# Patient Record
Sex: Female | Born: 1950 | Race: White | Hispanic: No | Marital: Married | Smoking: Never smoker
Health system: Southern US, Community
[De-identification: ages and names within clinical notes are randomized; demographics above are authoritative.]

## PROBLEM LIST (undated history)

## (undated) DIAGNOSIS — R7989 Other specified abnormal findings of blood chemistry: Secondary | ICD-10-CM

## (undated) DIAGNOSIS — M722 Plantar fascial fibromatosis: Secondary | ICD-10-CM

## (undated) DIAGNOSIS — M8580 Other specified disorders of bone density and structure, unspecified site: Secondary | ICD-10-CM

## (undated) DIAGNOSIS — E7211 Homocystinuria: Secondary | ICD-10-CM

## (undated) DIAGNOSIS — R0989 Other specified symptoms and signs involving the circulatory and respiratory systems: Secondary | ICD-10-CM

## (undated) HISTORY — PX: LAPAROSCOPIC ENDOMETRIOSIS FULGURATION: SUR769

## (undated) HISTORY — DX: Plantar fascial fibromatosis: M72.2

## (undated) HISTORY — DX: Other specified symptoms and signs involving the circulatory and respiratory systems: R09.89

## (undated) HISTORY — DX: Other specified disorders of bone density and structure, unspecified site: M85.80

## (undated) HISTORY — DX: Other specified abnormal findings of blood chemistry: R79.89

## (undated) HISTORY — PX: HEMORRHOID SURGERY: SHX153

## (undated) HISTORY — DX: Homocystinuria: E72.11

---

## 1998-05-17 ENCOUNTER — Emergency Department (HOSPITAL_COMMUNITY): Admission: EM | Admit: 1998-05-17 | Discharge: 1998-05-17 | Payer: Self-pay | Admitting: Emergency Medicine

## 1998-05-18 ENCOUNTER — Inpatient Hospital Stay (HOSPITAL_COMMUNITY): Admission: EM | Admit: 1998-05-18 | Discharge: 1998-05-20 | Payer: Self-pay | Admitting: Emergency Medicine

## 1998-09-18 ENCOUNTER — Other Ambulatory Visit: Admission: RE | Admit: 1998-09-18 | Discharge: 1998-09-18 | Payer: Self-pay | Admitting: Obstetrics and Gynecology

## 1999-09-30 HISTORY — PX: POLYPECTOMY: SHX149

## 1999-10-24 ENCOUNTER — Other Ambulatory Visit: Admission: RE | Admit: 1999-10-24 | Discharge: 1999-10-24 | Payer: Self-pay | Admitting: Obstetrics and Gynecology

## 2000-03-19 ENCOUNTER — Ambulatory Visit (HOSPITAL_COMMUNITY): Admission: RE | Admit: 2000-03-19 | Discharge: 2000-03-19 | Payer: Self-pay | Admitting: Gastroenterology

## 2000-11-19 ENCOUNTER — Other Ambulatory Visit: Admission: RE | Admit: 2000-11-19 | Discharge: 2000-11-19 | Payer: Self-pay | Admitting: Obstetrics and Gynecology

## 2001-10-06 ENCOUNTER — Encounter: Admission: RE | Admit: 2001-10-06 | Discharge: 2001-10-06 | Payer: Self-pay | Admitting: General Surgery

## 2001-10-06 ENCOUNTER — Encounter: Payer: Self-pay | Admitting: General Surgery

## 2001-10-07 ENCOUNTER — Ambulatory Visit (HOSPITAL_BASED_OUTPATIENT_CLINIC_OR_DEPARTMENT_OTHER): Admission: RE | Admit: 2001-10-07 | Discharge: 2001-10-08 | Payer: Self-pay | Admitting: General Surgery

## 2002-06-21 ENCOUNTER — Other Ambulatory Visit: Admission: RE | Admit: 2002-06-21 | Discharge: 2002-06-21 | Payer: Self-pay | Admitting: Obstetrics and Gynecology

## 2003-09-12 ENCOUNTER — Other Ambulatory Visit: Admission: RE | Admit: 2003-09-12 | Discharge: 2003-09-12 | Payer: Self-pay | Admitting: Obstetrics and Gynecology

## 2005-02-03 ENCOUNTER — Ambulatory Visit: Payer: Self-pay | Admitting: Internal Medicine

## 2005-10-27 ENCOUNTER — Ambulatory Visit: Payer: Self-pay | Admitting: Internal Medicine

## 2005-11-18 ENCOUNTER — Ambulatory Visit: Payer: Self-pay | Admitting: Internal Medicine

## 2006-07-10 ENCOUNTER — Ambulatory Visit: Payer: Self-pay | Admitting: Internal Medicine

## 2006-09-02 ENCOUNTER — Ambulatory Visit: Payer: Self-pay | Admitting: Internal Medicine

## 2006-11-25 ENCOUNTER — Ambulatory Visit: Payer: Self-pay | Admitting: Internal Medicine

## 2006-12-02 ENCOUNTER — Ambulatory Visit: Payer: Self-pay | Admitting: Internal Medicine

## 2006-12-02 LAB — CONVERTED CEMR LAB
ALT: 15 units/L (ref 0–40)
AST: 22 units/L (ref 0–37)
Albumin: 4 g/dL (ref 3.5–5.2)
Alkaline Phosphatase: 47 units/L (ref 39–117)
BUN: 12 mg/dL (ref 6–23)
Basophils Absolute: 0 10*3/uL (ref 0.0–0.1)
Basophils Relative: 0.3 % (ref 0.0–1.0)
Bilirubin, Direct: 0.1 mg/dL (ref 0.0–0.3)
CO2: 29 meq/L (ref 19–32)
Calcium: 9 mg/dL (ref 8.4–10.5)
Chloride: 108 meq/L (ref 96–112)
Creatinine, Ser: 0.4 mg/dL (ref 0.4–1.2)
Eosinophils Absolute: 0.2 10*3/uL (ref 0.0–0.6)
Eosinophils Relative: 4 % (ref 0.0–5.0)
GFR calc Af Amer: 213 mL/min
GFR calc non Af Amer: 176 mL/min
Glucose, Bld: 96 mg/dL (ref 70–99)
HCT: 41.8 % (ref 36.0–46.0)
Hemoglobin: 14.6 g/dL (ref 12.0–15.0)
Lymphocytes Relative: 35 % (ref 12.0–46.0)
MCHC: 35 g/dL (ref 30.0–36.0)
MCV: 91.7 fL (ref 78.0–100.0)
Monocytes Absolute: 0.4 10*3/uL (ref 0.2–0.7)
Monocytes Relative: 8.5 % (ref 3.0–11.0)
Neutro Abs: 2.5 10*3/uL (ref 1.4–7.7)
Neutrophils Relative %: 52.2 % (ref 43.0–77.0)
Platelets: 217 10*3/uL (ref 150–400)
Potassium: 4 meq/L (ref 3.5–5.1)
RBC: 4.56 M/uL (ref 3.87–5.11)
RDW: 11.7 % (ref 11.5–14.6)
Sodium: 142 meq/L (ref 135–145)
TSH: 2.64 microintl units/mL (ref 0.35–5.50)
Total Bilirubin: 1.2 mg/dL (ref 0.3–1.2)
Total Protein: 6.4 g/dL (ref 6.0–8.3)
WBC: 4.7 10*3/uL (ref 4.5–10.5)

## 2007-05-26 ENCOUNTER — Encounter: Payer: Self-pay | Admitting: Internal Medicine

## 2007-08-06 ENCOUNTER — Telehealth (INDEPENDENT_AMBULATORY_CARE_PROVIDER_SITE_OTHER): Payer: Self-pay | Admitting: *Deleted

## 2007-10-08 ENCOUNTER — Ambulatory Visit: Payer: Self-pay | Admitting: Internal Medicine

## 2007-10-08 DIAGNOSIS — M722 Plantar fascial fibromatosis: Secondary | ICD-10-CM

## 2007-10-11 ENCOUNTER — Ambulatory Visit: Payer: Self-pay | Admitting: Internal Medicine

## 2007-10-14 ENCOUNTER — Encounter (INDEPENDENT_AMBULATORY_CARE_PROVIDER_SITE_OTHER): Payer: Self-pay | Admitting: *Deleted

## 2007-10-25 ENCOUNTER — Encounter: Admission: RE | Admit: 2007-10-25 | Discharge: 2008-01-23 | Payer: Self-pay | Admitting: Internal Medicine

## 2007-10-29 ENCOUNTER — Encounter: Payer: Self-pay | Admitting: Internal Medicine

## 2008-01-18 ENCOUNTER — Encounter: Payer: Self-pay | Admitting: Internal Medicine

## 2008-02-11 ENCOUNTER — Encounter (INDEPENDENT_AMBULATORY_CARE_PROVIDER_SITE_OTHER): Payer: Self-pay | Admitting: *Deleted

## 2008-02-11 ENCOUNTER — Ambulatory Visit: Payer: Self-pay | Admitting: Internal Medicine

## 2008-02-11 DIAGNOSIS — N951 Menopausal and female climacteric states: Secondary | ICD-10-CM

## 2008-02-11 DIAGNOSIS — D126 Benign neoplasm of colon, unspecified: Secondary | ICD-10-CM

## 2008-02-15 ENCOUNTER — Encounter: Payer: Self-pay | Admitting: Internal Medicine

## 2008-02-17 ENCOUNTER — Encounter (INDEPENDENT_AMBULATORY_CARE_PROVIDER_SITE_OTHER): Payer: Self-pay | Admitting: *Deleted

## 2008-03-08 ENCOUNTER — Ambulatory Visit: Payer: Self-pay | Admitting: Gastroenterology

## 2008-07-17 ENCOUNTER — Telehealth: Payer: Self-pay | Admitting: Internal Medicine

## 2008-07-18 ENCOUNTER — Telehealth (INDEPENDENT_AMBULATORY_CARE_PROVIDER_SITE_OTHER): Payer: Self-pay | Admitting: *Deleted

## 2008-07-18 ENCOUNTER — Ambulatory Visit: Payer: Self-pay | Admitting: Internal Medicine

## 2008-07-19 ENCOUNTER — Telehealth (INDEPENDENT_AMBULATORY_CARE_PROVIDER_SITE_OTHER): Payer: Self-pay | Admitting: *Deleted

## 2008-11-06 ENCOUNTER — Ambulatory Visit: Payer: Self-pay | Admitting: Internal Medicine

## 2009-03-29 ENCOUNTER — Encounter (INDEPENDENT_AMBULATORY_CARE_PROVIDER_SITE_OTHER): Payer: Self-pay | Admitting: *Deleted

## 2009-05-01 ENCOUNTER — Ambulatory Visit: Payer: Self-pay | Admitting: Internal Medicine

## 2009-05-15 ENCOUNTER — Telehealth (INDEPENDENT_AMBULATORY_CARE_PROVIDER_SITE_OTHER): Payer: Self-pay | Admitting: *Deleted

## 2009-05-16 ENCOUNTER — Telehealth (INDEPENDENT_AMBULATORY_CARE_PROVIDER_SITE_OTHER): Payer: Self-pay | Admitting: *Deleted

## 2009-07-03 ENCOUNTER — Encounter: Payer: Self-pay | Admitting: Internal Medicine

## 2009-10-10 ENCOUNTER — Ambulatory Visit: Payer: Self-pay | Admitting: Internal Medicine

## 2009-12-20 ENCOUNTER — Ambulatory Visit: Payer: Self-pay | Admitting: Internal Medicine

## 2009-12-20 DIAGNOSIS — H9319 Tinnitus, unspecified ear: Secondary | ICD-10-CM | POA: Insufficient documentation

## 2009-12-20 DIAGNOSIS — H9190 Unspecified hearing loss, unspecified ear: Secondary | ICD-10-CM | POA: Insufficient documentation

## 2010-05-15 ENCOUNTER — Ambulatory Visit: Payer: Self-pay | Admitting: Internal Medicine

## 2010-05-15 ENCOUNTER — Encounter (INDEPENDENT_AMBULATORY_CARE_PROVIDER_SITE_OTHER): Payer: Self-pay | Admitting: *Deleted

## 2010-05-15 DIAGNOSIS — G479 Sleep disorder, unspecified: Secondary | ICD-10-CM

## 2010-05-15 DIAGNOSIS — R002 Palpitations: Secondary | ICD-10-CM | POA: Insufficient documentation

## 2010-06-18 ENCOUNTER — Ambulatory Visit: Payer: Self-pay | Admitting: Internal Medicine

## 2010-06-28 ENCOUNTER — Encounter (INDEPENDENT_AMBULATORY_CARE_PROVIDER_SITE_OTHER): Payer: Self-pay | Admitting: *Deleted

## 2010-07-01 ENCOUNTER — Encounter (INDEPENDENT_AMBULATORY_CARE_PROVIDER_SITE_OTHER): Payer: Self-pay | Admitting: *Deleted

## 2010-07-03 ENCOUNTER — Ambulatory Visit: Payer: Self-pay | Admitting: Gastroenterology

## 2010-10-27 LAB — CONVERTED CEMR LAB
ALT: 17 units/L (ref 0–35)
ALT: 27 units/L (ref 0–35)
AST: 21 units/L (ref 0–37)
AST: 25 units/L (ref 0–37)
Albumin: 4.2 g/dL (ref 3.5–5.2)
Albumin: 4.4 g/dL (ref 3.5–5.2)
Alkaline Phosphatase: 53 units/L (ref 39–117)
Alkaline Phosphatase: 54 units/L (ref 39–117)
BUN: 16 mg/dL (ref 6–23)
BUN: 17 mg/dL (ref 6–23)
Basophils Absolute: 0 10*3/uL (ref 0.0–0.1)
Basophils Absolute: 0 10*3/uL (ref 0.0–0.1)
Basophils Relative: 0.5 % (ref 0.0–3.0)
Basophils Relative: 0.7 % (ref 0.0–1.0)
Bilirubin, Direct: 0.1 mg/dL (ref 0.0–0.3)
Bilirubin, Direct: 0.2 mg/dL (ref 0.0–0.3)
CO2: 29 meq/L (ref 19–32)
CO2: 30 meq/L (ref 19–32)
Calcium: 9.2 mg/dL (ref 8.4–10.5)
Calcium: 9.4 mg/dL (ref 8.4–10.5)
Chloride: 105 meq/L (ref 96–112)
Chloride: 109 meq/L (ref 96–112)
Cholesterol: 170 mg/dL (ref 0–200)
Cholesterol: 193 mg/dL (ref 0–200)
Creatinine, Ser: 0.6 mg/dL (ref 0.4–1.2)
Creatinine, Ser: 0.7 mg/dL (ref 0.4–1.2)
Eosinophils Absolute: 0.1 10*3/uL (ref 0.0–0.7)
Eosinophils Absolute: 0.2 10*3/uL (ref 0.0–0.7)
Eosinophils Relative: 3.2 % (ref 0.0–5.0)
Eosinophils Relative: 3.3 % (ref 0.0–5.0)
Free T4: 0.79 ng/dL (ref 0.60–1.60)
GFR calc Af Amer: 111 mL/min
GFR calc non Af Amer: 104.67 mL/min (ref >60)
GFR calc non Af Amer: 92 mL/min
Glucose, Bld: 88 mg/dL (ref 70–99)
Glucose, Bld: 93 mg/dL (ref 70–99)
HCT: 44.1 % (ref 36.0–46.0)
HCT: 44.6 % (ref 36.0–46.0)
HDL: 54.6 mg/dL (ref >39.0)
HDL: 66.7 mg/dL (ref >39.00)
Hemoglobin: 14.7 g/dL (ref 12.0–15.0)
Hemoglobin: 15.2 g/dL — ABNORMAL HIGH (ref 12.0–15.0)
LDL Cholesterol: 107 mg/dL — ABNORMAL HIGH (ref 0–99)
LDL Cholesterol: 114 mg/dL — ABNORMAL HIGH (ref 0–99)
Lymphocytes Relative: 33.7 % (ref 12.0–46.0)
Lymphocytes Relative: 34.1 % (ref 12.0–46.0)
Lymphs Abs: 1.8 10*3/uL (ref 0.7–4.0)
MCHC: 33.4 g/dL (ref 30.0–36.0)
MCHC: 34.1 g/dL (ref 30.0–36.0)
MCV: 93.9 fL (ref 78.0–100.0)
MCV: 95.3 fL (ref 78.0–100.0)
Magnesium: 2 mg/dL (ref 1.5–2.5)
Monocytes Absolute: 0.3 10*3/uL (ref 0.1–1.0)
Monocytes Absolute: 0.4 10*3/uL (ref 0.1–1.0)
Monocytes Relative: 7.4 % (ref 3.0–12.0)
Monocytes Relative: 7.9 % (ref 3.0–12.0)
Neutro Abs: 2.2 10*3/uL (ref 1.4–7.7)
Neutro Abs: 2.9 10*3/uL (ref 1.4–7.7)
Neutrophils Relative %: 54.5 % (ref 43.0–77.0)
Neutrophils Relative %: 54.7 % (ref 43.0–77.0)
Platelets: 207 10*3/uL (ref 150.0–400.0)
Platelets: 219 10*3/uL (ref 150–400)
Potassium: 3.8 meq/L (ref 3.5–5.1)
Potassium: 5.3 meq/L — ABNORMAL HIGH (ref 3.5–5.1)
RBC: 4.68 M/uL (ref 3.87–5.11)
RBC: 4.7 M/uL (ref 3.87–5.11)
RDW: 11.6 % (ref 11.5–14.6)
RDW: 12.6 % (ref 11.5–14.6)
Sodium: 142 meq/L (ref 135–145)
Sodium: 143 meq/L (ref 135–145)
TSH: 1.55 microintl units/mL (ref 0.35–5.50)
TSH: 1.9 microintl units/mL (ref 0.35–5.50)
Total Bilirubin: 0.9 mg/dL (ref 0.3–1.2)
Total Bilirubin: 1.1 mg/dL (ref 0.3–1.2)
Total CHOL/HDL Ratio: 3
Total CHOL/HDL Ratio: 3.1
Total Protein: 6.6 g/dL (ref 6.0–8.3)
Total Protein: 6.7 g/dL (ref 6.0–8.3)
Triglycerides: 41 mg/dL (ref 0–149)
Triglycerides: 63 mg/dL (ref 0.0–149.0)
VLDL: 12.6 mg/dL (ref 0.0–40.0)
VLDL: 8 mg/dL (ref 0–40)
WBC: 4 10*3/uL — ABNORMAL LOW (ref 4.5–10.5)
WBC: 5.2 10*3/uL (ref 4.5–10.5)

## 2010-10-29 NOTE — Miscellaneous (Signed)
Summary: [facility] Previsit/prep  Clinical Lists Changes  Medications: Added new medication of MOVIPREP 100 GM  SOLR (PEG-KCL-NACL-NASULF-NA ASC-C) As per prep instructions. - Signed Rx of MOVIPREP 100 GM  SOLR (PEG-KCL-NACL-NASULF-NA ASC-C) As per prep instructions.;  #1 x 0;  Signed;  Entered by: Wyona Almas RN;  Authorized by: Mardella Layman MD [facility];  Method used: Electronically to [facility] # [id number]*, [address] [place], [place], [place]  [address], Ph: [phone] or [phone], Fax: [phone] Observations: Added new observation of ALLERGY REV: Done (07/03/2010 8:43)    Prescriptions: MOVIPREP 100 GM  SOLR (PEG-KCL-NACL-NASULF-NA ASC-C) As per prep instructions.  #1 x 0   Entered by:   Wyona Almas RN   Authorized by:   Mardella Layman MD [facility]   Signed by:   Wyona Almas RN on 07/03/2010   Method used:   Electronically to        [facility] [address] [id number]* (retail)       [address]       [place], [place]  [address]       Ph: [phone] or [phone]       Fax: [phone]   RxID:   [id number]

## 2010-10-29 NOTE — Assessment & Plan Note (Signed)
Summary: flu shot/cbs  Nurse Visit   Allergies: 1)  ! Pcn 2)  ! Cipro 3)  ! Erythromycin 4)  ! Minocin (Minocycline Hcl) 5)  ! Lamisil (Terbinafine Hcl) 6)  ! Ambien Cr (Zolpidem Tartrate)  Orders Added: 1)  Admin 1st Vaccine [90471] 2)  Flu Vaccine 56yrs + [[address]] Flu Vaccine Consent Questions     Do you have a history of severe allergic reactions to this vaccine? no    Any prior history of allergic reactions to egg and/or gelatin? no    Do you have a sensitivity to the preservative Thimersol? no    Do you have a past history of Guillan-Barre Syndrome? no    Do you currently have an acute febrile illness? no    Have you ever had a severe reaction to latex? no    Vaccine information given and explained to patient? yes    Are you currently pregnant? no    Lot Number:[id number]   Exp Date:03/29/2011   Site Given  Right Deltoid IMes-CCC]  .lbflu

## 2010-10-29 NOTE — Letter (Signed)
Summary: [facility] Instructions  [facility]  [address] [place], [place] [address]   Phone: [phone]  Fax: [phone]       Brittany Myers    16-Aug-1951    MRN: [id number]        Procedure Day /Date: Monday 07-15-10     Arrival Time: 10:30 a.m.      Procedure Time: 11:30 a.m.     Location of Procedure:                    _x _  [facility] (4th Floor)                        PREPARATION FOR COLONOSCOPY WITH MOVIPREP   Starting 5 days prior to your procedure  07-10-10 do not eat nuts, seeds, popcorn, corn, beans, peas,  salads, or any raw vegetables.  Do not take any fiber supplements (e.g. Metamucil, Citrucel, and Benefiber).  THE DAY BEFORE YOUR PROCEDURE         DATE:  07-14-10  DAY:  Sunday  1.  Drink clear liquids the entire day-NO SOLID FOOD  2.  Do not drink anything colored red or purple.  Avoid juices with pulp.  No orange juice.  3.  Drink at least 64 oz. (8 glasses) of fluid/clear liquids during the day to prevent dehydration and help the prep work efficiently.  CLEAR LIQUIDS INCLUDE: Water Jello Ice Popsicles Tea (sugar ok, no milk/cream) Powdered fruit flavored drinks Coffee (sugar ok, no milk/cream) Gatorade Juice: apple, white grape, white cranberry  Lemonade Clear bullion, consomm, broth Carbonated beverages (any kind) Strained chicken noodle soup Hard Candy                             4.  In the morning, mix first dose of MoviPrep solution:    Empty 1 Pouch A and 1 Pouch B into the disposable container    Add lukewarm drinking water to the top line of the container. Mix to dissolve    Refrigerate (mixed solution should be used within 24 hrs)  5.  Begin drinking the prep at 5:00 p.m. The MoviPrep container is divided by 4 marks.   Every 15 minutes drink the solution down to the next mark (approximately 8 oz) until the full liter is complete.   6.  Follow completed prep with 16 oz of clear liquid of your  choice (Nothing red or purple).  Continue to drink clear liquids until bedtime.  7.  Before going to bed, mix second dose of MoviPrep solution:    Empty 1 Pouch A and 1 Pouch B into the disposable container    Add lukewarm drinking water to the top line of the container. Mix to dissolve    Refrigerate  THE DAY OF YOUR PROCEDURE      DATE:  07-15-10  DAY: Monday  Beginning at  6:30 a.m. (5 hours before procedure):         1. Every 15 minutes, drink the solution down to the next mark (approx 8 oz) until the full liter is complete.  2. Follow completed prep with 16 oz. of clear liquid of your choice.    3. You may drink clear liquids until  9:30 a.m. (2 HOURS BEFORE PROCEDURE).   MEDICATION INSTRUCTIONS  Unless otherwise instructed, you should take regular prescription medications with a small sip of water  as early as possible the morning of your procedure.        OTHER INSTRUCTIONS  You will need a responsible adult at least 60 years of age to accompany you and drive you home.   This person must remain in the waiting room during your procedure.  Wear loose fitting clothing that is easily removed.  Leave jewelry and other valuables at home.  However, you may wish to bring a book to read or  an iPod/MP3 player to listen to music as you wait for your procedure to start.  Remove all body piercing jewelry and leave at home.  Total time from sign-in until discharge is approximately 2-3 hours.  You should go home directly after your procedure and rest.  You can resume normal activities the  day after your procedure.  The day of your procedure you should not:   Drive   Make legal decisions   Operate machinery   Drink alcohol   Return to work  You will receive specific instructions about eating, activities and medications before you leave.    The above instructions have been reviewed and explained to me by   Wyona Almas RN  July 03, 2010 9:18 AM     I  fully understand and can verbalize these instructions _____________________________ Date _________

## 2010-10-29 NOTE — Assessment & Plan Note (Signed)
Summary: cpx/cbs   Vital Signs:  Patient profile:   60 year old female Height:      64.5 inches (163.83 cm) Weight:      118.38 pounds (53.81 kg) BMI:     20.08 Pulse rate:   59 / minute Resp:     12 per minute BP sitting:   127 / 70  (left arm) Cuff size:   regular  Vitals Entered By: Lucious Groves CMA (May 15, 2010 9:55 AM)  Is Patient Diabetic? No Pain Assessment Patient in pain? no        History of Present Illness: Brittany Myers is here for a physical; she has been having palpitations @ rest, ? related to recent heat wave. The patient denies dizziness, presyncope, syncope, chest pain, shortness of breath, and throat tightness.  Associated symptoms include heat intolerance.  The patient denies the following symptoms: blurred vision, numbness, weakness, diaphoresis, nausea, and weight loss.  The palpitations are described as a sensation of a butterfly in the chest.  The palpitations are sudden in onset, intermittent, occur weekly, occur at rest, and occur at night.        The patient also presents with Insomnia.  The patient reports frequent awakening, early awakening, and daytime somnolence, but denies difficulty falling asleep, nightmares, leg movements, snoring, and apnea noted by partner.  Past treatments that have been effective include sedatives, but the Lorazepam causes "medicine head" the next day.    Preventive Screening-Counseling & Management  Alcohol-Tobacco     Smoking Status: never  Caffeine-Diet-Exercise     Does Patient Exercise: no  Current Medications (verified): 1)  Multivitamin 2)  Folic Acid 3)  Asa 81mg  4)  Magnesium 5)  Vit D 6)  Fluticonisole 7)  Calcium 1000mg  8)  Ativan 0.5 Mg Tabs (Lorazepam) .... 1/2-1 At Bedtime As Needed  Allergies (verified): 1)  ! Pcn 2)  ! Cipro 3)  ! Erythromycin 4)  ! Minocin (Minocycline Hcl) 5)  ! Lamisil (Terbinafine Hcl) 6)  ! 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) Osteopenia -1.5 T score  Past Surgical History: 5 surgeries for endometriosis, Dr Rosalio Macadamia Lamisil , Penicillin  reaction( hives, angioedema), Hemorrhoidectomy G4 P0 Colon polypectomy2001,Dr Victorino Dike  Family History: Father: CHF Mother: Endometriosis, elevated lipids , Osteopenia Siblings: BJY:NWGNFAOZ  lipids; MGF :MI in 51s; M aunt : liver cancer; PGM: colon cancer; MGM : Alsheimer's  Social History: heart healthy diet Occupation:General Surveyor, mining Married Never Smoked Alcohol use-yes: minimally Regular exercise-no  Review of Systems  The patient denies anorexia, fever, weight loss, weight gain, vision loss, hoarseness, prolonged cough, hemoptysis, abdominal pain, melena, hematochezia, severe indigestion/heartburn, hematuria, incontinence, suspicious skin lesions, depression, unusual weight change, abnormal bleeding, enlarged lymph nodes, and angioedema.         Some tinnitus with minimal hearing loss R ear. Some cramping & gas infraumbilical area every other day ; raw foods are a trigger.  Physical Exam  General:  Thin ,well-nourished; alert,appropriate and cooperative throughout examination Head:  Normocephalic and atraumatic without obvious abnormalities.  Eyes:  No corneal or conjunctival inflammation noted. No lid lag.Perrla. Funduscopic exam benign, without hemorrhages, exudates or papilledema.  Ears:  External ear exam shows no significant lesions or deformities.  Otoscopic examination reveals clear canals, tympanic membranes are intact bilaterally without bulging, retraction, inflammation or discharge. Hearing is grossly normal bilaterally. Nose:  External nasal  examination shows no deformity or inflammation. Nasal mucosa are pink and moist without lesions or exudates. Mouth:  Oral mucosa and oropharynx without lesions or exudates.  Teeth  in good repair. Neck:  No deformities, masses, or tenderness noted. Lungs:  Normal respiratory effort, chest expands symmetrically. Lungs are clear to auscultation, no crackles or wheezes. Heart:  Normal rate and regular rhythm. S1 and S2 normal without gallop, murmur, click, rub or other extra sounds. Abdomen:  Bowel sounds positive,abdomen soft and non-tender without masses, organomegaly or hernias noted. Bruit not heard Genitalia:  Dr Rosalio Macadamia Msk:  No deformity or scoliosis noted of thoracic or lumbar spine.   Pulses:  R and L carotid,radial,dorsalis pedis and posterior tibial pulses are full and equal bilaterally Extremities:  No clubbing, cyanosis, edema, or deformity noted with normal full range of motion of all joints.  Absent L great toe nail Neurologic:  alert & oriented X3 and DTRs symmetrical and normal.   Skin:  Intact without suspicious lesions or rashes Cervical Nodes:  No lymphadenopathy noted Axillary Nodes:  No palpable lymphadenopathy Psych:  memory intact for recent and remote, normally interactive, good eye contact, and not anxious appearing.     Impression & Recommendations:  Problem # 1:  ROUTINE GENERAL MEDICAL EXAM@HEALTH  CARE FACL (ICD-V70.0)  Orders: EKG w/ Interpretation (93000) Venipuncture (29562) TLB-Lipid Panel (80061-LIPID) TLB-BMP (Basic Metabolic Panel-BMET) (80048-METABOL) TLB-CBC Platelet - w/Differential (85025-CBCD) TLB-Hepatic/Liver Function Pnl (80076-HEPATIC) TLB-TSH (Thyroid Stimulating Hormone) (84443-TSH) TLB-Magnesium (Mg) (83735-MG) TLB-T4 (Thyrox), Free [phone]) Specimen Handling ([address])  Problem # 2:  PALPITATIONS (ICD-785.1)  Orders: TLB-Magnesium (Mg) (83735-MG) TLB-T4 (Thyrox), Free (52841-LK4M) Specimen Handling (01027)  Problem # 3:  SLEEP DISORDER, CHRONIC (ICD-780.50)  Problem # 4:  COLONIC POLYPS, BENIGN (ICD-211.3)  Orders: Gastroenterology Referral (GI)  Complete Medication List: 1)  Multivitamin  2)   Folic Acid  3)  Asa 81mg   4)  Magnesium  5)  Vit D  6)  Fluticonisole  7)  Calcium 1000mg   8)  Ativan 0.5 Mg Tabs (Lorazepam) .... 1/2-1 at bedtime as needed 9)  Silenor 6 Mg Tabs (Doxepin hcl) .Marland Kitchen.. 1 at bedtime as needed  Patient Instructions: 1)  Trial of Align once daily as needed  for cramps or gas. Report response to samples of Silenor 6 mg at bedtime as needed  Prescriptions: SILENOR 6 MG TABS (DOXEPIN HCL) 1 at bedtime as needed  #30 x 1   Entered and Authorized by:   Marga Melnick MD   Signed by:   Marga Melnick MD on 05/15/2010   Method used:   Print then Give to Patient   RxID:   [id number]

## 2010-10-29 NOTE — Assessment & Plan Note (Signed)
Summary: righ ear pain//ph   Vital Signs:  Patient profile:   60 year old female Weight:      119.8 pounds Temp:     98.8 degrees F oral Pulse rate:   64 / minute Resp:     15 per minute BP sitting:   94 / 60  (left arm) Cuff size:   regular  Vitals Entered By: Shonna Chock (December 20, 2009 8:15 AM) CC: 1.) Right ear pain/ringing x 3 weeks  2.)Examine right side of back Comments REVIEWED MED LIST, PATIENT AGREED DOSE AND INSTRUCTION CORRECT  Holding all meds x 3 weeks since symptpms started. Hearing Screening:  Left Ear: 500-45               1000-45               2000-55               3000-30               4000-25 Right Ear: 500-35                1000-25                 2000-30                3000-25                4000-10  Hearing Screen   CC:  1.) Right ear pain/ringing x 3 weeks  2.)Examine right side of back.  History of Present Illness: Awoke 3 weeks ago with ringing in R ear , "like white noise from old tv sets" with hearing loss noted later.No pain, headache, signs of URI or imbalance. PMH of cerumen impaction 2-3 years ago.Rx: Debrox w/o benefit. Rash noted 03/19  R upper back; she questions shingles.She flew back from [facility] 4 weeks; no ear pain in flight. No excess noise exposure or excess ASA intake.  Allergies: 1)  ! Pcn 2)  ! Cipro 3)  ! Erythromycin 4)  ! Minocin (Minocycline Hcl) 5)  ! Lamisil (Terbinafine Hcl) 6)  ! Ambien Cr (Zolpidem Tartrate)  Review of Systems General:  Denies chills, fever, and sweats. ENT:  Denies nasal congestion, postnasal drainage, sinus pressure, and sore throat; No purulence. Derm:  Complains of rash; denies itching. Neuro:  Denies difficulty with concentration, disturbances in coordination, numbness, poor balance, sensation of room spinning, and tingling.  Physical Exam  General:  well-nourished,in no acute distress; alert,appropriate and cooperative throughout examination Eyes:  No corneal or conjunctival  inflammation noted. EOMI. Perrla. Field of Vision grossly normal. Ears:  External ear exam shows no significant lesions or deformities.  Otoscopic examination reveals clear canals, tympanic membranes are intact bilaterally without bulging, retraction, inflammation or discharge. Hearing is grossly normal ; whisper heard @  6 feet.Tuning fork exam WNL Nose:  External nasal examination shows no deformity or inflammation. Nasal mucosa are pink and moist without lesions or exudates. Mouth:  Oral mucosa and oropharynx without lesions or exudates.  Teeth in good repair. Pulses:  R and L carotid  pulses are full and equal bilaterally w/o bruits Neurologic:  alert & oriented X3, cranial nerves II-XII intact, strength normal in all extremities, gait normal, DTRs symmetrical and normal, finger-to-nose normal, and Romberg negative.   Skin:  1X1 cm dry rash  Cervical Nodes:  No lymphadenopathy noted Axillary Nodes:  No palpable lymphadenopathy   Impression & Recommendations:  Problem # 1:  TINNITUS, RIGHT (ICD-388.30)  Orders: Audiology (Audio)  Problem # 2:  HEARING LOSS (ICD-389.9)  Orders: Audiology (Audio)  Problem # 3:  RASH-NONVESICULAR (ICD-782.1) fungal etiology suggested  Complete Medication List: 1)  Multivitamin  2)  Folic Acid  3)  Asa 81mg   4)  Magnesium  5)  Vit D  6)  Fluticonisole  7)  Calcium 1000mg   8)  Ativan 0.5 Mg Tabs (Lorazepam) .... 1/2-1 at bedtime as needed  Patient Instructions: 1)  Nizoral cream two times a day to rash

## 2010-10-29 NOTE — Letter (Signed)
Summary: Cancer Screening/[facility] Personalized Risk Profile  Cancer Screening/Me [facility] Personalized Risk Profile   Imported By: Lanelle Bal 05/23/2010 12:44:29  _____________________________________________________________________  External Attachment:    Type:   Image     Comment:   External Document

## 2010-10-29 NOTE — Assessment & Plan Note (Signed)
Summary: flu shot/kdc  Nurse Visit   Allergies: 1)  ! Pcn 2)  ! Cipro 3)  ! Erythromycin 4)  ! Minocin (Minocycline Hcl) 5)  ! Lamisil (Terbinafine Hcl) 6)  ! Ambien Cr (Zolpidem Tartrate)  Orders Added: 1)  Admin 1st Vaccine [90471] 2)  Flu Vaccine 70yrs + [04540] Flu Vaccine Consent Questions     Do you have a history of severe allergic reactions to this vaccine? no    Any prior history of allergic reactions to egg and/or gelatin? no    Do you have a sensitivity to the preservative Thimersol? no    Do you have a past history of Guillan-Barre Syndrome? no    Do you currently have an acute febrile illness? no    Have you ever had a severe reaction to latex? no    Vaccine information given and explained to patient? yes    Are you currently pregnant? no    Lot Number:[id number]   Exp Date:03/28/2010   Site Given  Left Deltoid IM .lbflu

## 2010-10-29 NOTE — Letter (Signed)
Summary: Pre Visit Letter Revised  [facility]  [address] [place], [place] [address]   Phone: [phone]  Fax: [phone]    05/15/2010 MRN: [id number]  Brittany Myers [address] [place], [place]  [address]              Procedure Date:  07-12-10    Welcome to the Gastroenterology Division at [facility].    You are scheduled to see a nurse for your pre-procedure visit on 06-28-10 at 11:00a.m. on the 3rd floor at [facility], [address]  We ask that you try to arrive at our office 15 minutes prior to your appointment time to allow for check-in.  Please take a minute to review the attached form.  If you answer "Yes" to one or more of the questions on the first page, we ask that you call the person listed at your earliest opportunity.  If you answer "No" to all of the questions, please complete the rest of the form and bring it to your appointment.    Your nurse visit will consist of discussing your medical and surgical history, your immediate family medical history, and your medications.    If you are unable to list all of your medications on the form, please bring the medication bottles to your appointment and we will list them.  We will need to be aware of both prescribed and over the counter drugs.  We will need to know exact dosage information as well.    Please be prepared to read and sign documents such as consent forms, a financial agreement, and acknowledgement forms.  If necessary, and with your consent, a friend or relative is welcome to sit-in on the nurse visit with you.  Please bring your insurance card so that we may make a copy of it.  If your insurance requires a referral to see a specialist, please bring your referral form from your primary care physician.  No co-pay is required for this nurse visit.     If you cannot keep your appointment, please call [phone] to cancel or reschedule prior to your appointment date.  This  allows [place] the opportunity to schedule an appointment for another patient in need of care.   Thank you for choosing [facility] for your medical needs.  We appreciate the opportunity to care for you.  Please visit [place] at our website  to learn more about our practice.                     Sincerely,  The Gastroenterology Division

## 2010-10-29 NOTE — Letter (Signed)
Summary: Pre Visit No Show Letter  [facility]  [address] [place], [place] [address]   Phone: [phone]  Fax: [phone]        June 28, 2010 MRN: [id number]    Brittany Myers [address] [place], [place]  [address]    Dear Brittany Myers,   We have been unable to reach you by phone concerning the pre-procedure visit that you missed on 06/28/10. For this reason,your procedure scheduled on 07/15/10 has been cancelled. Our scheduling staff will gladly assist you with rescheduling your appointments at a more convenient time. Please call our office at [phone] between the hours of 8:00am and 5:00pm, press option #2 to reach an appointment scheduler. Please consider updating your contact numbers at this time so that we can reach you by phone in the future with schedule changes or results.    Thank you,    Wyona Almas RN [facility]

## 2010-12-06 ENCOUNTER — Other Ambulatory Visit: Payer: Self-pay | Admitting: Dermatology

## 2011-01-14 ENCOUNTER — Encounter: Payer: Self-pay | Admitting: Internal Medicine

## 2011-01-14 ENCOUNTER — Ambulatory Visit (INDEPENDENT_AMBULATORY_CARE_PROVIDER_SITE_OTHER): Payer: BC Managed Care – PPO | Admitting: Internal Medicine

## 2011-01-14 VITALS — BP 118/70 | HR 60 | Temp 98.3°F | Wt 121.6 lb

## 2011-01-14 DIAGNOSIS — J40 Bronchitis, not specified as acute or chronic: Secondary | ICD-10-CM

## 2011-01-14 MED ORDER — HYDROCODONE-HOMATROPINE 5-1.5 MG/5ML PO SYRP: 5.0000 mL | ORAL_SOLUTION | Freq: Four times a day (QID) | ORAL | Status: AC | PRN

## 2011-01-14 MED ORDER — AZITHROMYCIN 250 MG PO TABS: 250.0000 mg | ORAL_TABLET | Freq: Every day | ORAL | Status: AC

## 2011-01-14 NOTE — Patient Instructions (Signed)
Please continue to force fluids. If  symptoms don't resolve a chest x-ray would be indicated.

## 2011-01-14 NOTE — Progress Notes (Signed)
  Subjective:    Patient ID: Brittany Myers, female    DOB: [birth year], 60 y.o.   MRN: [id number]  HPI symptoms began last week as a scratchy throat and laryngitis. Several days later she began to have a cough which has been productive of yellow sputum.  She specifically denies all signs of rhinosinusitis. Specifically she has no frontal headache, facial pain, purulent nasal secretions, dental pain, or fever.  She has taken nonsteroidals for these symptoms as well as forcing oral fluids.  She's had some headache after the cough.  She has never smoked; there is no past medical history of asthma or reactive airways disease.   Review of Systems     Objective:   Physical Exam she is in no acute distress. Pupils are equal round reactive to light; there is no conjunctival inflammation. She has  She has no lymphadenopathy about the neck or axilla. The nares are patent without exudate. She is slightly hoarse; there is no erythema in the pharynx. The otic canals are clear as are the tympanic membranes.  She exhibits no increased work of breathing. She does have suggestion of asymmetric rales on the right both superiorly and inferiorly. I  She has a slow S4 without significant murmur or gallop.  Skin is warm and dry well tanned.        Assessment & Plan:   #1 bronchitis without associated upper respiratory tract infection symptoms  Plan: #1 Z-Pak  #2 Hydromet  Chest x-ray will be performed if she does not have rapid resolution of her symptoms with this regimen.

## 2011-01-27 ENCOUNTER — Telehealth: Payer: Self-pay | Admitting: Internal Medicine

## 2011-01-27 DIAGNOSIS — R05 Cough: Secondary | ICD-10-CM

## 2011-01-27 NOTE — Telephone Encounter (Signed)
Left msg on voicemail for return call. 

## 2011-01-27 NOTE — Telephone Encounter (Signed)
Patient was seen [id number] she finished z-pac - she is still coughing - she is unable to take cough medicine at night keeps her awake - she wants to know if she should try taking it during the day - she also mentioned dr hopper may want to do a chest x-ray

## 2011-01-28 ENCOUNTER — Ambulatory Visit (INDEPENDENT_AMBULATORY_CARE_PROVIDER_SITE_OTHER)
Admission: RE | Admit: 2011-01-28 | Discharge: 2011-01-28 | Disposition: A | Discharge status: Home / Self Care | Payer: BC Managed Care – PPO | Source: Ambulatory Visit | Attending: Internal Medicine | Admitting: Internal Medicine

## 2011-01-28 DIAGNOSIS — R05 Cough: Secondary | ICD-10-CM

## 2011-01-28 NOTE — Telephone Encounter (Signed)
Spoke w/ pt says she is still w/ cough and according to office note chest xray will be needed if no better. Says she can take cough med today w/ some relief since she is unable to take it a night because it keeps her up. Will place order for chest xray pt will get today.

## 2011-03-07 ENCOUNTER — Encounter: Payer: Self-pay | Admitting: Internal Medicine

## 2011-03-07 ENCOUNTER — Ambulatory Visit (INDEPENDENT_AMBULATORY_CARE_PROVIDER_SITE_OTHER): Payer: BC Managed Care – PPO | Admitting: Internal Medicine

## 2011-03-07 VITALS — BP 104/62 | HR 79 | Wt 123.4 lb

## 2011-03-07 DIAGNOSIS — R002 Palpitations: Secondary | ICD-10-CM

## 2011-03-07 DIAGNOSIS — I499 Cardiac arrhythmia, unspecified: Secondary | ICD-10-CM

## 2011-03-07 DIAGNOSIS — M5412 Radiculopathy, cervical region: Secondary | ICD-10-CM

## 2011-03-07 LAB — CBC WITH DIFFERENTIAL/PLATELET
Basophils Absolute: 0 10*3/uL (ref 0.0–0.1)
Eosinophils Absolute: 0.2 10*3/uL (ref 0.0–0.7)
Lymphocytes Relative: 34.7 % (ref 12.0–46.0)
MCHC: 34.7 g/dL (ref 30.0–36.0)
MCV: 94.8 fl (ref 78.0–100.0)
Monocytes Absolute: 0.5 10*3/uL (ref 0.1–1.0)
Neutrophils Relative %: 53.1 % (ref 43.0–77.0)
Platelets: 209 10*3/uL (ref 150.0–400.0)
RDW: 11.8 % (ref 11.5–14.6)

## 2011-03-07 LAB — CALCIUM: Calcium: 9.5 mg/dL (ref 8.4–10.5)

## 2011-03-07 LAB — MAGNESIUM: Magnesium: 2.4 mg/dL (ref 1.5–2.5)

## 2011-03-07 LAB — POTASSIUM: Potassium: 4.4 mEq/L (ref 3.5–5.1)

## 2011-03-07 LAB — T4, FREE: Free T4: 0.67 ng/dL (ref 0.60–1.60)

## 2011-03-07 LAB — TSH: TSH: 1.78 u[IU]/mL (ref 0.35–5.50)

## 2011-03-07 MED ORDER — GABAPENTIN 100 MG PO CAPS: 100.0000 mg | ORAL_CAPSULE | Freq: Three times a day (TID) | ORAL | Status: DC

## 2011-03-07 NOTE — Patient Instructions (Signed)
Avoid stimulants; get plenty of rest

## 2011-03-07 NOTE — Progress Notes (Signed)
  Subjective:    Patient ID: Brittany Myers, female    DOB: [birth year], 60 y.o.   MRN: [id number]  HPI #1 Irregular pulse @ BCP monitor Onset: 06/06 as  "flopping " or"effervescence" Context: @ rest Duration: up to seconds Frequency: intermittently all day Trigger: caffeine Constitutional:no Fever, chills, sweats, weight change,  sleep issues. Some fatigue since bonchitis Cardiovascular: no Chest pain, syncope, diaphoresis, claudication GI:no Change in bowels, anorexia Derm:no Skin, hair, or nail changes Neurologic:no Tremor, numbness and tingling Psych: no Anxiety, depression, panic attacks Endocrine:no Hoarseness, temperature intolerance Possible triggers:no New medications, stimulants(decongestants, diet pills, nicotine)  #2Extremity pain RUE  Onset:2-3 weeks ago Trigger/injury:fall 2 years Pain quality:toothache Pain severity:up to 6 Duration:hrs Radiation:R thumb to R neck Exacerbating factors:no Review of systems: Musculoskeletal: muscle cramp or pain:no;  joint stiffness, redness, or swelling:no Skin:rash, color change:no Neuro:weakness:R hand since fall in 2010; incontinence (stool/urine):no; numbness and tingling:some tingling in R palm Heme:lymphadenopathy: no; abnormal bleeding or bruising:no Treatment/response:Advil 1 bid with some benefit     Review of Systems see above     Objective:   Physical Exam Gen.: Healthy and well-nourished in appearance. Alert, appropriate and cooperative throughout exam. Head: Normocephalic without obvious abnormalities Eyes: No corneal or conjunctival inflammation noted.Extraocular motion intact.  No lid lag Neck: No deformities, masses, or tenderness noted. Range of motion  Intact but pain with flexion or R lateral rotation. Thyroid normal. Lungs: Normal respiratory effort; chest expands symmetrically. Lungs are clear to auscultation without rales, wheezes, or increased work of breathing. Heart: Normal rate and rhythm. Normal S1 and  S2. No gallop, click, or rub. No murmur; occasional premature. Musculoskeletal/extremities: No deformity or scoliosis noted of  the thoracic or lumbar spine. No clubbing, cyanosis, edema, or deformity noted. Tone & strength  normal.Joints normal. Nail health  good. Vascular: Carotid, radial artery, dorsalis pedis and dorsalis posterior tibial pulses are full and equal. No bruits present. Neurologic: Alert and oriented x3. Deep tendon reflexes symmetrical and normal.    Gait & romberg normal       Skin: Intact without suspicious lesions or rashes;tanned. Lymph: No cervical, axillary, or inguinal lymphadenopathy present. Psych: Mood and affect are normal. Normally interactive                                                                                         Assessment & Plan:  #1 Unifocal PVCs #2 R Cervical radiculopathy Plan : see Orders

## 2011-03-07 NOTE — Progress Notes (Signed)
  Subjective:    Patient ID: Brittany Myers, female    DOB: Sep 21, 1951, 60 y.o.   MRN: [id number]  HPI    Review of Systems     Objective:   Physical Exam        Assessment & Plan:  EKG : UNIFOCAL PVCs. NO  AF, no ischemia

## 2011-03-12 ENCOUNTER — Ambulatory Visit (INDEPENDENT_AMBULATORY_CARE_PROVIDER_SITE_OTHER)
Admission: RE | Admit: 2011-03-12 | Discharge: 2011-03-12 | Disposition: A | Discharge status: Home / Self Care | Payer: BC Managed Care – PPO | Source: Ambulatory Visit | Attending: Internal Medicine | Admitting: Internal Medicine

## 2011-03-12 DIAGNOSIS — M5412 Radiculopathy, cervical region: Secondary | ICD-10-CM

## 2011-03-13 ENCOUNTER — Telehealth: Payer: Self-pay | Admitting: Internal Medicine

## 2011-03-13 NOTE — Telephone Encounter (Signed)
Monitor symptoms; earlier if persistant or progressive

## 2011-03-13 NOTE — Telephone Encounter (Signed)
Spoke w/ says she is going to be seeing Dr. Eden Emms on July 11 but still would like to see Dr. Myrtis Ser informed she should still be able to see him just make someone aware if she has to schedule f/u since it has already been approved.

## 2011-03-13 NOTE — Telephone Encounter (Signed)
Left msg for pt to return call.

## 2011-03-13 NOTE — Telephone Encounter (Signed)
In reference to Cardiology referral, patient originally requested Myrtis Ser because her husband sees him.  Myrtis Ser was no longer accepting new patients but made exception for this patient, however his first available appt is August 10.  Patient asking if Dr. Alwyn Ren thinks it is ok to wait this long to see Cardiology, or should she reschedule to be seen sooner by another Physician.  Please advise.

## 2011-03-17 ENCOUNTER — Encounter: Payer: Self-pay | Admitting: Cardiology

## 2011-04-07 ENCOUNTER — Institutional Professional Consult (permissible substitution): Payer: BC Managed Care – PPO | Admitting: Internal Medicine

## 2011-04-09 ENCOUNTER — Encounter: Payer: Self-pay | Admitting: Cardiovascular Disease

## 2011-04-09 ENCOUNTER — Ambulatory Visit (INDEPENDENT_AMBULATORY_CARE_PROVIDER_SITE_OTHER): Payer: BC Managed Care – PPO | Admitting: Cardiovascular Disease

## 2011-04-09 ENCOUNTER — Encounter (INDEPENDENT_AMBULATORY_CARE_PROVIDER_SITE_OTHER): Payer: BC Managed Care – PPO

## 2011-04-09 DIAGNOSIS — R002 Palpitations: Secondary | ICD-10-CM

## 2011-04-09 DIAGNOSIS — I493 Ventricular premature depolarization: Secondary | ICD-10-CM

## 2011-04-09 DIAGNOSIS — R079 Chest pain, unspecified: Secondary | ICD-10-CM | POA: Insufficient documentation

## 2011-04-09 DIAGNOSIS — I4949 Other premature depolarization: Secondary | ICD-10-CM

## 2011-04-09 NOTE — Patient Instructions (Signed)
Your physician has requested that you have a stress echocardiogram. For further information please visit [email or web address] Please follow instruction sheet as given.  Your physician has recommended that you wear a 21 day event monitor. Event monitors are medical devices that record the heart's electrical activity. Doctors most often [place] these monitors to diagnose arrhythmias. Arrhythmias are problems with the speed or rhythm of the heartbeat. The monitor is a small, portable device. You can wear one while you do your normal daily activities. This is usually used to diagnose what is causing palpitations/syncope (passing out).  Your physician recommends that you schedule a follow-up appointment in: 4-5 weeks.

## 2011-04-09 NOTE — Assessment & Plan Note (Signed)
Atypical. In setting of PVC;s needs stress echo

## 2011-04-09 NOTE — Progress Notes (Signed)
60 yo referred by Dr Alwyn Ren for unifocal PVC;s and palpitations.  Chronic over the years but much worse since June.  Initially noted during menapause.  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  No long runs of SVT type symptoms just premature palpitations.  No associated presyncope.  Occasional SSCP related to putting her Bra on with occasional radiation to jaw.  Had ETT about 9 years ago when her father died of an MI/CHF which was normal.  No excess stimulants or cafeinne.  No new suplements.    Reviewed Dr Caryl Never notes Reviewed lab work from 03/09/11 normal including thyroid  ROS: Denies fever, malais, weight loss, blurry vision, decreased visual acuity, cough, sputum, SOB, hemoptysis, pleuritic pain, palpitaitons, heartburn, abdominal pain, melena, lower extremity edema, claudication, or rash.  All other systems reviewed and negative   General: Affect appropriate Healthy:  appears stated age HEENT: normal Neck supple with no adenopathy JVP normal no bruits no thyromegaly Lungs clear with no wheezing and good diaphragmatic motion Heart:  S1/S2 no murmur,rub, gallop or click PMI normal Abdomen: benighn, BS positve, no tenderness, no AAA no bruit.  No HSM or HJR Distal pulses intact with no bruits No edema Neuro non-focal Skin warm and dry No muscular weakness  Medications Current Outpatient Prescriptions  Medication Sig Dispense Refill  . aspirin 81 MG tablet Take 81 mg by mouth daily.        . Calcium Carbonate-Vit D-Min (CALCIUM 1200 PO) Take 2 tablets by mouth daily.       Marland Kitchen estradiol (ESTRACE VAGINAL) 0.1 MG/GM vaginal cream Place 2 g vaginally daily.        . folic acid (FOLVITE) 400 MCG tablet Take 400 mcg by mouth daily.        . multivitamin (THERAGRAN) per tablet Take 1 tablet by mouth daily.        Marland Kitchen VITAMIN D, CHOLECALCIFEROL, PO Take 1 tablet by mouth daily.         Allergies Ciprofloxacin; Erythromycin; Minocycline hcl; Penicillins; and  Zolpidem tartrate  Family History: Family History  Problem Relation Age of Onset  . Endometriosis Mother   . Hyperlipidemia Mother   . Osteopenia Mother   . Hyperlipidemia Brother   . Heart attack Maternal Grandfather   . Liver cancer Maternal Aunt   . Colon cancer Paternal Grandmother   . Alzheimer's disease Maternal Grandmother   . Heart failure Father     Social History: History   Social History  . Marital Status: Married    Spouse Name: N/A    Number of Children: N/A  . Years of Education: N/A   Occupational History  . General Contractor/Interior Designer    Social History Main Topics  . Smoking status: Never Smoker   . Smokeless tobacco: Not on file  . Alcohol Use: Yes     minimally  . Drug Use: No  . Sexually Active: Not on file   Other Topics Concern  . Not on file   Social History Narrative   Heart healthy dietRegular Exercise-no    Electrocardiogram:  NSR PVC otherwise normal QT 387  Assessment and Plan

## 2011-04-09 NOTE — Assessment & Plan Note (Signed)
Likely benign PVC;s related to hormonal changes and stress.  21 day event moniter and stress echo. Encouraging that they are worse at rest and not during exercise.

## 2011-04-10 ENCOUNTER — Encounter: Payer: Self-pay | Admitting: *Deleted

## 2011-04-16 ENCOUNTER — Ambulatory Visit (HOSPITAL_COMMUNITY): Payer: BC Managed Care – PPO | Attending: Cardiovascular Disease | Admitting: Radiology

## 2011-04-16 ENCOUNTER — Other Ambulatory Visit (HOSPITAL_COMMUNITY): Payer: BC Managed Care – PPO | Admitting: Radiology

## 2011-04-17 ENCOUNTER — Telehealth: Payer: Self-pay | Admitting: Cardiovascular Disease

## 2011-04-17 NOTE — Telephone Encounter (Signed)
Per pt call, pt is trying to figure out cost and pt would like to know the code on monitor pt is wearing. Please return pt call to advise and discuss.

## 2011-04-17 NOTE — Telephone Encounter (Signed)
Please refer this pt to Windell Moulding.  She has different codes depending on what monitor pt will get.   Thanks,   Charmaine

## 2011-05-02 ENCOUNTER — Institutional Professional Consult (permissible substitution): Payer: BC Managed Care – PPO | Admitting: Cardiology

## 2011-05-09 ENCOUNTER — Institutional Professional Consult (permissible substitution): Payer: BC Managed Care – PPO | Admitting: Cardiology

## 2011-05-16 ENCOUNTER — Ambulatory Visit (INDEPENDENT_AMBULATORY_CARE_PROVIDER_SITE_OTHER): Payer: BC Managed Care – PPO | Admitting: Cardiovascular Disease

## 2011-05-16 ENCOUNTER — Encounter: Payer: Self-pay | Admitting: Cardiovascular Disease

## 2011-05-16 DIAGNOSIS — R079 Chest pain, unspecified: Secondary | ICD-10-CM

## 2011-05-16 DIAGNOSIS — R002 Palpitations: Secondary | ICD-10-CM

## 2011-05-16 MED ORDER — ACEBUTOLOL HCL 200 MG PO CAPS: 200.0000 mg | ORAL_CAPSULE | Freq: Every day | ORAL | Status: DC

## 2011-05-16 NOTE — Assessment & Plan Note (Signed)
Will start Acebutolol 200mg  to see if beta blocker helps with symptoms.

## 2011-05-16 NOTE — Assessment & Plan Note (Signed)
Related to palpitations and in my mind stress Did not have stress echo as she felt poorly the morning of the test and it would cost her over $2000.  Will start beta blocker and see if we need to reschedule this in 8 weeks.  Pain is atypical and not likely angina

## 2011-05-16 NOTE — Progress Notes (Signed)
60 yo referred by Dr Alwyn Ren for unifocal PVC;s and palpitations. Chronic over the years but much worse since June. Initially noted during menapause. 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 No long runs of SVT type symptoms just premature palpitations. No associated presyncope. Occasional SSCP related to putting her Bra on with occasional radiation to jaw. Had ETT about 9 years ago when her father died of an MI/CHF which was normal. No excess stimulants or cafeinne. No new suplements.   Reviewed Dr Caryl Never notes Reviewed lab work from 03/09/11 normal including thyroid Reviewed over 75 pages of event monitor tracings.  Only unifocal PVC;s  ROS: Denies fever, malais, weight loss, blurry vision, decreased visual acuity, cough, sputum, SOB, hemoptysis, pleuritic pain, palpitaitons, heartburn, abdominal pain, melena, lower extremity edema, claudication, or rash.  All other systems reviewed and negative  General: Affect appropriate Healthy:  appears stated age HEENT: normal Neck supple with no adenopathy JVP normal no bruits no thyromegaly Lungs clear with no wheezing and good diaphragmatic motion Heart:  S1/S2 no murmur,rub, gallop or click PMI normal Abdomen: benighn, BS positve, no tenderness, no AAA no bruit.  No HSM or HJR Distal pulses intact with no bruits No edema Neuro non-focal Skin warm and dry No muscular weakness   Current Outpatient Prescriptions  Medication Sig Dispense Refill  . aspirin 81 MG tablet Take 81 mg by mouth daily.        . Calcium Carbonate-Vit D-Min (CALCIUM 1200 PO) Take 2 tablets by mouth daily.       Marland Kitchen estradiol (ESTRACE VAGINAL) 0.1 MG/GM vaginal cream Place 2 g vaginally daily.        . folic acid (FOLVITE) 400 MCG tablet Take 400 mcg by mouth daily.        . multivitamin (THERAGRAN) per tablet Take 1 tablet by mouth daily.        Marland Kitchen VITAMIN D, CHOLECALCIFEROL, PO Take 1 tablet by mouth daily.          Allergies  Ciprofloxacin; Erythromycin; Minocycline hcl; Penicillins; and Zolpidem tartrate  Electrocardiogram:  Assessment and Plan

## 2011-05-16 NOTE — Patient Instructions (Signed)
Your physician recommends that you schedule a follow-up appointment in:  8 WEEKS  START SECTRAL 200 MG ONCE DAILY

## 2011-05-20 ENCOUNTER — Encounter: Payer: Self-pay | Admitting: Internal Medicine

## 2011-05-20 ENCOUNTER — Ambulatory Visit (INDEPENDENT_AMBULATORY_CARE_PROVIDER_SITE_OTHER): Payer: BC Managed Care – PPO | Admitting: Internal Medicine

## 2011-05-20 DIAGNOSIS — M949 Disorder of cartilage, unspecified: Secondary | ICD-10-CM

## 2011-05-20 DIAGNOSIS — Z0000 Encounter for general adult medical examination without abnormal findings: Secondary | ICD-10-CM

## 2011-05-20 DIAGNOSIS — M810 Age-related osteoporosis without current pathological fracture: Secondary | ICD-10-CM | POA: Insufficient documentation

## 2011-05-20 DIAGNOSIS — Z8249 Family history of ischemic heart disease and other diseases of the circulatory system: Secondary | ICD-10-CM

## 2011-05-20 DIAGNOSIS — M8580 Other specified disorders of bone density and structure, unspecified site: Secondary | ICD-10-CM

## 2011-05-20 LAB — BASIC METABOLIC PANEL
BUN: 19 mg/dL (ref 6–23)
Calcium: 9.6 mg/dL (ref 8.4–10.5)
Creatinine, Ser: 0.7 mg/dL (ref 0.4–1.2)
GFR: 95.38 mL/min (ref >60.00)
Glucose, Bld: 88 mg/dL (ref 70–99)
Sodium: 142 mEq/L (ref 135–145)

## 2011-05-20 LAB — HEPATIC FUNCTION PANEL
ALT: 25 U/L (ref 0–35)
AST: 25 U/L (ref 0–37)
Alkaline Phosphatase: 56 U/L (ref 39–117)
Bilirubin, Direct: 0.1 mg/dL (ref 0.0–0.3)
Total Bilirubin: 1.1 mg/dL (ref 0.3–1.2)

## 2011-05-20 LAB — LIPID PANEL: HDL: 68 mg/dL (ref >39.00)

## 2011-05-20 NOTE — Progress Notes (Signed)
Subjective:    Patient ID: Brittany Myers, female    DOB: 12-Nov-1950, 60 y.o.   MRN: [id number]  HPI  Brittany Myers  is here for a physical;acute issues i nclude PVCs @ rest, especially @ night. Evaluation with 21 day event monitor by Brittany Thurston Pounds blocker Rxed with  follow up in next 8 weeks.      Review of Systems Patient reports no significant  vision/ hearing  changes, adenopathy,fever, weight change,  persistant / recurrent hoarseness , swallowing issues, edema,persistant /recurrent cough, hemoptysis, dyspnea( rest/ exertional/paroxysmal nocturnal), gastrointestinal bleeding(melena, rectal bleeding), abdominal pain, significant heartburn,  bowel changes,GU symptoms(dysuria, hematuria,pyuria, incontinence ), Gyn symptoms(abnormal  bleeding , pain),  syncope, focal weakness, memory loss,numbness & tingling, skin/hair /nail changes,abnormal bruising or bleeding, anxiety,or depression.  Colonoscopy scheduled next month     Objective:   Physical Exam Gen.: Thin but healthy and well-nourished in appearance. Alert, appropriate and cooperative throughout exam. Head: Normocephalic without obvious abnormalities Eyes: No corneal or conjunctival inflammation noted. Pupils equal round reactive to light and accommodation. Fundal exam is benign without hemorrhages, exudate, papilledema. Extraocular motion intact. Vision grossly normal with contacts. Ears: External  ear exam reveals no significant lesions or deformities. Canals clear .TMs normal. Hearing is grossly normal bilaterally. Nose: External nasal exam reveals no deformity or inflammation. Nasal mucosa are pink and moist. No lesions or exudates noted. Mouth: Oral mucosa and oropharynx reveal no lesions or exudates. Teeth in good repair. Neck: No deformities, masses, or tenderness noted. Range of motion &. Thyroid normal. Lungs: Normal respiratory effort; chest expands symmetrically. Lungs are clear to auscultation without rales, wheezes, or increased  work of breathing. Heart: Slow rate (Note: not on Beta blocker yet) and regular  rhythm. Normal S1 and S2. No gallop, click, or rub. No  murmur. Abdomen: Bowel sounds normal; abdomen soft and nontender. No masses, organomegaly or hernias noted. Genitalia: Brittany Myers, Brittany Myers   .                                                                                   Musculoskeletal/extremities: No deformity or scoliosis noted of  the thoracic or lumbar spine. No clubbing, cyanosis, edema, or deformity noted. Range of motion  normal .Tone & strength  normal.Joints normal. Nail health  good. Vascular: Carotid, radial artery, dorsalis pedis and  posterior tibial pulses are full and equal. No bruits present. Neurologic: Alert and oriented x3. Deep tendon reflexes symmetrical and normal.         Skin: Intact without suspicious lesions or rashes. Lymph: No cervical, axillary lymphadenopathy present. Psych: Mood and affect are normal. Normally interactive                                                                                         Assessment & Plan:  #1 comprehensive  physical exam; no acute findings #2 see Problem List with Assessments & Recommendations Plan: see Orders

## 2011-05-20 NOTE — Patient Instructions (Signed)
Preventive Health Care: Eat a low-fat diet with lots of fruits and vegetables, up to 7-9 servings per day.Consume less than 30 grams of sugar per day from foods & drinks with High Fructose Corn Syrup as # 1,2,3 or #4 on label. Health Care Power of Attorney & Living Will place you in charge of your health care  decisions. Verify these are  in place. Depression is common in our stressful world. If you're feeling down or losing interest in things you normally enjoy, please call. Recommended lifestyle interventions for Osteoporosis include calcium 600 mg twice a day  & vitamin D3 supplementation to keep vit D  level @ least 40-60. The usual vitamin D3 dose is 1000 IU daily; but individual dose is determined by annual vitamin D level monitor. Also weight bearing exercise such as  walking 30-45 minutes 3-4  X per week is recommended. [employer]

## 2011-06-05 ENCOUNTER — Telehealth: Payer: Self-pay

## 2011-06-05 DIAGNOSIS — H9319 Tinnitus, unspecified ear: Secondary | ICD-10-CM

## 2011-06-05 NOTE — Telephone Encounter (Signed)
Pt called and stated she has been experiencing ringing in the ears and pt called Dr. Jacalyn Lefevre office to make an appt and was told she could not be seen without records from her pcp.  Pt states once records have been sent an appt can be made.  The phone number for Dr. Collier Flowers is [phone] and the fax number is [phone]. Pls advise.

## 2011-06-06 NOTE — Telephone Encounter (Signed)
Referral placed.

## 2011-06-06 NOTE — Telephone Encounter (Signed)
OK; she just had CPX.Send that report.

## 2011-06-20 ENCOUNTER — Other Ambulatory Visit: Payer: Self-pay | Admitting: Otolaryngology

## 2011-07-08 ENCOUNTER — Encounter: Payer: Self-pay | Admitting: Cardiovascular Disease

## 2011-07-08 ENCOUNTER — Ambulatory Visit (INDEPENDENT_AMBULATORY_CARE_PROVIDER_SITE_OTHER): Payer: BC Managed Care – PPO | Admitting: Cardiovascular Disease

## 2011-07-08 DIAGNOSIS — R002 Palpitations: Secondary | ICD-10-CM

## 2011-07-08 DIAGNOSIS — R0789 Other chest pain: Secondary | ICD-10-CM | POA: Insufficient documentation

## 2011-07-08 DIAGNOSIS — R079 Chest pain, unspecified: Secondary | ICD-10-CM

## 2011-07-08 NOTE — Assessment & Plan Note (Signed)
Atypical.  Normal myovue 2004  F/U stess echo if symptoms worsen or recur.

## 2011-07-08 NOTE — Progress Notes (Signed)
60 yo referred by Dr Alwyn Ren for unifocal PVC;s and palpitations. Chronic over the years but much worse since June. Initially noted during menapause. 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 No long runs of SVT type symptoms just premature palpitations. No associated presyncope. Occasional SSCP related to putting her Bra on with occasional radiation to jaw. Reviewed report from exercise myovue 2004 which was normal  No excess stimulants or cafeinne. No new suplements.  Symptoms seem improved and still related to stress.  Tried acebutolol for two days but  "dragged" with it.  Cancelled stress echo due to cost but SSCP improved.  Pain atypical and related to palpitations.  Reviewed Dr Caryl Never notes Reviewed lab work from 03/09/11 normal including thyroid  Reviewed over 75 pages of event monitor tracings. Only unifocal PVC;s  ROS: Denies fever, malais, weight loss, blurry vision, decreased visual acuity, cough, sputum, SOB, hemoptysis, pleuritic pain, palpitaitons, heartburn, abdominal pain, melena, lower extremity edema, claudication, or rash.  All other systems reviewed and negative  General: Affect appropriate Healthy:  appears stated age HEENT: normal Neck supple with no adenopathy JVP normal no bruits no thyromegaly Lungs clear with no wheezing and good diaphragmatic motion Heart:  S1/S2 no murmur,rub, gallop or click PMI normal Abdomen: benighn, BS positve, no tenderness, no AAA no bruit.  No HSM or HJR Distal pulses intact with no bruits No edema Neuro non-focal Skin warm and dry No muscular weakness   Current Outpatient Prescriptions  Medication Sig Dispense Refill  . aspirin 81 MG tablet Take 81 mg by mouth daily.        . Calcium Carbonate-Vit D-Min (CALCIUM 1200 PO) Take 2 tablets by mouth daily.       Marland Kitchen estradiol (ESTRACE VAGINAL) 0.1 MG/GM vaginal cream Place 2 g vaginally daily.        . folic acid (FOLVITE) 400 MCG tablet Take 400 mcg by mouth  daily.        . multivitamin (THERAGRAN) per tablet Take 1 tablet by mouth daily.        Marland Kitchen VITAMIN D, CHOLECALCIFEROL, PO Take 1 tablet by mouth daily.         Allergies  Ciprofloxacin; Lamisil; Penicillins; Erythromycin; Minocycline hcl; and Zolpidem tartrate  Electrocardiogram:  Assessment and Plan

## 2011-07-08 NOTE — Assessment & Plan Note (Signed)
Benign  Not tolerant to acebutolol.  Unifocal PVC;s only thing ever documented.  Avoid stimulants

## 2011-07-08 NOTE — Patient Instructions (Signed)
Your physician wants you to follow-up in: ONE YEAR You will receive a reminder letter in the mail two months in advance. If you don't receive a letter, please call our office to schedule the follow-up appointment.  

## 2011-07-31 ENCOUNTER — Ambulatory Visit (INDEPENDENT_AMBULATORY_CARE_PROVIDER_SITE_OTHER): Payer: BC Managed Care – PPO

## 2011-07-31 ENCOUNTER — Encounter: Payer: Self-pay | Admitting: Internal Medicine

## 2011-07-31 DIAGNOSIS — Z23 Encounter for immunization: Secondary | ICD-10-CM

## 2011-08-11 ENCOUNTER — Encounter: Payer: Self-pay | Admitting: Internal Medicine

## 2011-08-19 ENCOUNTER — Ambulatory Visit (INDEPENDENT_AMBULATORY_CARE_PROVIDER_SITE_OTHER): Payer: BC Managed Care – PPO | Admitting: Family Medicine

## 2011-08-19 ENCOUNTER — Encounter: Payer: Self-pay | Admitting: Family Medicine

## 2011-08-19 VITALS — BP 112/74 | HR 59 | Temp 98.5°F | Wt 123.8 lb

## 2011-08-19 DIAGNOSIS — R300 Dysuria: Secondary | ICD-10-CM

## 2011-08-19 DIAGNOSIS — N390 Urinary tract infection, site not specified: Secondary | ICD-10-CM

## 2011-08-19 LAB — POCT URINALYSIS DIPSTICK
Bilirubin, UA: NEGATIVE
Nitrite, UA: NEGATIVE
Protein, UA: NEGATIVE
pH, UA: 7.5

## 2011-08-19 MED ORDER — FLUCONAZOLE 150 MG PO TABS: ORAL_TABLET | ORAL | Status: DC

## 2011-08-19 MED ORDER — NITROFURANTOIN MONOHYD MACRO 100 MG PO CAPS: 100.0000 mg | ORAL_CAPSULE | Freq: Two times a day (BID) | ORAL | Status: AC

## 2011-08-19 NOTE — Progress Notes (Signed)
  Subjective:    Brittany Myers is a 60 y.o. female who complains of burning with urination, frequency and suprapubic pressure. She has had symptoms for several days. Patient also complains of back pain. Patient denies congestion, cough, fever, headache, rhinitis, sorethroat and vaginal discharge. Patient does not have a history of recurrent UTI. Patient does not have a history of pyelonephritis.   The following portions of the patient's history were reviewed and updated as appropriate: allergies, current medications, past family history, past medical history, past social history, past surgical history and problem list.  Review of Systems Pertinent items are noted in HPI.    Objective:    BP 112/74  Pulse 59  Temp(Src) 98.5 F (36.9 C) (Oral)  Wt 123 lb 12.8 oz (56.155 kg)  SpO2 98% General appearance: alert, cooperative, appears stated age and no distress Lungs: clear to auscultation bilaterally Abdomen: soft, non-tender; bowel sounds normal; no masses,  no organomegaly  Laboratory:  Urine dipstick: large for hemoglobin and large for leukocyte esterase.   Micro exam: not done.    Assessment:    UTI     Plan:    Medications: nitrofurantoin. Maintain adequate hydration. Follow up if symptoms not improving, and as needed.

## 2011-08-19 NOTE — Patient Instructions (Signed)

## 2011-08-22 LAB — URINE CULTURE

## 2011-09-10 ENCOUNTER — Other Ambulatory Visit: Payer: Self-pay | Admitting: Dermatology

## 2011-09-30 HISTORY — PX: COLONOSCOPY: SHX174

## 2011-10-06 ENCOUNTER — Other Ambulatory Visit: Payer: Self-pay | Admitting: Dermatology

## 2011-12-10 ENCOUNTER — Other Ambulatory Visit: Payer: BC Managed Care – PPO

## 2011-12-10 ENCOUNTER — Ambulatory Visit
Admission: RE | Admit: 2011-12-10 | Discharge: 2011-12-10 | Disposition: A | Discharge status: Home / Self Care | Payer: BC Managed Care – PPO | Source: Ambulatory Visit | Attending: Otolaryngology | Admitting: Otolaryngology

## 2011-12-10 MED ORDER — GADOBENATE DIMEGLUMINE 529 MG/ML IV SOLN
10.0000 mL | Freq: Once | INTRAVENOUS | Status: AC | PRN
  Administered 2011-12-10: 10 mL via INTRAVENOUS

## 2012-02-13 ENCOUNTER — Encounter: Payer: Self-pay | Admitting: Gastroenterology

## 2012-03-02 ENCOUNTER — Ambulatory Visit (INDEPENDENT_AMBULATORY_CARE_PROVIDER_SITE_OTHER): Payer: BC Managed Care – PPO | Admitting: Family Medicine

## 2012-03-02 ENCOUNTER — Encounter: Payer: Self-pay | Admitting: Family Medicine

## 2012-03-02 VITALS — BP 120/75 | HR 68 | Temp 97.5°F | Ht 64.0 in | Wt 126.2 lb

## 2012-03-02 DIAGNOSIS — M479 Spondylosis, unspecified: Secondary | ICD-10-CM

## 2012-03-02 DIAGNOSIS — M47819 Spondylosis without myelopathy or radiculopathy, site unspecified: Secondary | ICD-10-CM | POA: Insufficient documentation

## 2012-03-02 MED ORDER — NAPROXEN 500 MG PO TABS: 500.0000 mg | ORAL_TABLET | Freq: Two times a day (BID) | ORAL | Status: DC

## 2012-03-02 NOTE — Patient Instructions (Signed)
We'll call you with your Neurosurgery appt Start the Naproxen twice daily- w/ food Call with any questions or concerns Hang in there!!!

## 2012-03-02 NOTE — Progress Notes (Signed)
  Subjective:    Patient ID: Brittany Myers, female    DOB: 12-23-1950, 61 y.o.   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  Took gabapentin x2 days w/ pain relief but 'it made me so stupid I had to stop'.  sxs improved but again returned ~ March and have been worsening.  Unable to exercise w/out pain- walking, running is too jarring.  Yoga provides relief but certain positions are not possible.  Aleve will provide pain relief but also is sedating.  R hand dominant.  Having numbness in ulnar nerve distribution and 'i'm just clumsy w/ this arm'.   Review of Systems For ROS see HPI     Objective:   Physical Exam  Vitals reviewed. Constitutional: She is oriented to person, place, and time. She appears well-developed and well-nourished. No distress.  Neck: Normal range of motion. Neck supple.       Mild discomfort when turning head to R Pain not reproducible on exam- although not aggressive w/ manipulation  Lymphadenopathy:    She has no cervical adenopathy.  Neurological: She is alert and oriented to person, place, and time. She has normal reflexes. No cranial nerve deficit.       Decreased sensation in R ulnar nerve distribution          Assessment & Plan:

## 2012-03-02 NOTE — Assessment & Plan Note (Signed)
New to provider.  Reviewed xray done last year.  Pt's sxs have recurred and are worsening.  Will start prescription NSAIDs and refer to neurosurg for complete evaluation and tx.  Aware that pt will need MRI but will defer to neurosurg to order to appropriate study.

## 2012-03-04 ENCOUNTER — Ambulatory Visit: Payer: BC Managed Care – PPO | Admitting: Internal Medicine

## 2012-03-04 ENCOUNTER — Other Ambulatory Visit: Payer: Self-pay | Admitting: Dermatology

## 2012-03-10 ENCOUNTER — Ambulatory Visit (AMBULATORY_SURGERY_CENTER): Payer: BC Managed Care – PPO | Admitting: *Deleted

## 2012-03-10 ENCOUNTER — Encounter: Payer: Self-pay | Admitting: Gastroenterology

## 2012-03-10 VITALS — Ht 64.0 in | Wt 126.4 lb

## 2012-03-10 DIAGNOSIS — Z1211 Encounter for screening for malignant neoplasm of colon: Secondary | ICD-10-CM

## 2012-03-10 MED ORDER — MOVIPREP 100 G PO SOLR: ORAL | Status: DC

## 2012-03-24 ENCOUNTER — Ambulatory Visit (AMBULATORY_SURGERY_CENTER): Payer: BC Managed Care – PPO | Admitting: Gastroenterology

## 2012-03-24 ENCOUNTER — Encounter: Payer: Self-pay | Admitting: Gastroenterology

## 2012-03-24 VITALS — BP 131/58 | HR 76 | Temp 97.0°F | Resp 20 | Ht 64.0 in | Wt 124.0 lb

## 2012-03-24 DIAGNOSIS — Z1211 Encounter for screening for malignant neoplasm of colon: Secondary | ICD-10-CM

## 2012-03-24 MED ORDER — SODIUM CHLORIDE 0.9 % IV SOLN: 500.0000 mL | INTRAVENOUS | Status: DC

## 2012-03-24 NOTE — Op Note (Signed)
[facility] [address] [place], [place]  [address]  COLONOSCOPY PROCEDURE REPORT  PATIENT:  Brittany Myers, Brittany Myers  MR#:  [id number] BIRTHDATE:  05-Jul-1951, 60 yrs. old  GENDER:  female ENDOSCOPIST:  Vania Rea. Jarold Motto, MD, [facility] REF. BY:  Marga Melnick, M.D. PROCEDURE DATE:  03/24/2012 PROCEDURE:  Average-risk screening colonoscopy G0121 ASA CLASS:  Class I INDICATIONS:  Routine Risk Screening MEDICATIONS:   propofol (Diprivan) 160 mg IV  DESCRIPTION OF PROCEDURE:   After the risks and benefits and of the procedure were explained, informed consent was obtained. Digital rectal exam was performed and revealed no abnormalities. The LB [id number]-[id number] endoscope was introduced through the anus and advanced to the cecum, which was identified by both the appendix and ileocecal valve.  The quality of the prep was excellent, using MoviPrep.  The instrument was then slowly withdrawn as the colon was fully examined. <<PROCEDUREIMAGES>>  FINDINGS:  No polyps or cancers were seen.  This was otherwise a normal examination of the colon.   Retroflexed views in the rectum revealed no abnormalities.    The scope was then withdrawn from the patient and the procedure completed.  COMPLICATIONS:  None ENDOSCOPIC IMPRESSION: 1) No polyps or cancers 2) Otherwise normal examination RECOMMENDATIONS: 1) Continue current colorectal screening recommendations for "routine risk" patients with a repeat colonoscopy in 10 years.  REPEAT EXAM:  No  ______________________________ Vania Rea. Jarold Motto, MD, [facility]  CC:  n. eSIGNED:   Vania Rea. Kendyl Festa at 03/24/2012 09:41 AM  Jacky Kindle, [id number]

## 2012-03-24 NOTE — Progress Notes (Signed)
Patient did not experience any of the following events: a burn prior to discharge; a fall within the facility; wrong site/side/patient/procedure/implant event; or a hospital transfer or hospital admission upon discharge from the facility. (G8907) Patient did not have preoperative order for IV antibiotic SSI prophylaxis. (G8918)  

## 2012-03-24 NOTE — Patient Instructions (Addendum)
Discharge instructions given with verbal understanding. Normal exam. Resume previous medications. YOU HAD AN ENDOSCOPIC PROCEDURE TODAY AT THE [facility]: Refer to the procedure report that was given to you for any specific questions about what was found during the examination.  If the procedure report does not answer your questions, please call your gastroenterologist to clarify.  If you requested that your care partner not be given the details of your procedure findings, then the procedure report has been included in a sealed envelope for you to review at your convenience later.  YOU SHOULD EXPECT: Some feelings of bloating in the abdomen. Passage of more gas than usual.  Walking can help get rid of the air that was put into your GI tract during the procedure and reduce the bloating. If you had a lower endoscopy (such as a colonoscopy or flexible sigmoidoscopy) you may notice spotting of blood in your stool or on the toilet paper. If you underwent a bowel prep for your procedure, then you may not have a normal bowel movement for a few days.  DIET: Your first meal following the procedure should be a light meal and then it is ok to progress to your normal diet.  A half-sandwich or bowl of soup is an example of a good first meal.  Heavy or fried foods are harder to digest and may make you feel nauseous or bloated.  Likewise meals heavy in dairy and vegetables can cause extra gas to form and this can also increase the bloating.  Drink plenty of fluids but you should avoid alcoholic beverages for 24 hours.  ACTIVITY: Your care partner should take you home directly after the procedure.  You should plan to take it easy, moving slowly for the rest of the day.  You can resume normal activity the day after the procedure however you should NOT DRIVE or use heavy machinery for 24 hours (because of the sedation medicines used during the test).    SYMPTOMS TO REPORT IMMEDIATELY: A gastroenterologist  can be reached at any hour.  During normal business hours, 8:30 AM to 5:00 PM Monday through Friday, call [phone].  After hours and on weekends, please call the GI answering service at [phone] who will take a message and have the physician on call contact you.   Following lower endoscopy (colonoscopy or flexible sigmoidoscopy):  Excessive amounts of blood in the stool  Significant tenderness or worsening of abdominal pains  Swelling of the abdomen that is new, acute  Fever of 100F or higher  FOLLOW UP: If any biopsies were taken you will be contacted by phone or by letter within the next 1-3 weeks.  Call your gastroenterologist if you have not heard about the biopsies in 3 weeks.  Our staff will call the home number listed on your records the next business day following your procedure to check on you and address any questions or concerns that you may have at that time regarding the information given to you following your procedure. This is a courtesy call and so if there is no answer at the home number and we have not heard from you through the emergency physician on call, we will assume that you have returned to your regular daily activities without incident.  SIGNATURES/CONFIDENTIALITY: You and/or your care partner have signed paperwork which will be entered into your electronic medical record.  These signatures attest to the fact that that the information above on your After Visit Summary has been reviewed   and is understood.  Full responsibility of the confidentiality of this discharge information lies with you and/or your care-partner. 

## 2012-03-25 ENCOUNTER — Telehealth: Payer: Self-pay | Admitting: *Deleted

## 2012-03-25 NOTE — Telephone Encounter (Signed)
  Follow up Call-  Call back number 03/24/2012  Post procedure Call Back phone  # [phone]  Permission to leave phone message Yes     Patient questions:  Do you have a fever, pain , or abdominal swelling? no Pain Score  0 *  Have you tolerated food without any problems? yes  Have you been able to return to your normal activities? yes  Do you have any questions about your discharge instructions: Diet   no Medications  no Follow up visit  no  Do you have questions or concerns about your Care? no  Actions: * If pain score is 4 or above: No action needed, pain <4.

## 2012-05-20 ENCOUNTER — Encounter: Payer: Self-pay | Admitting: Internal Medicine

## 2012-05-20 ENCOUNTER — Ambulatory Visit (INDEPENDENT_AMBULATORY_CARE_PROVIDER_SITE_OTHER): Payer: BC Managed Care – PPO | Admitting: Internal Medicine

## 2012-05-20 ENCOUNTER — Encounter: Payer: BC Managed Care – PPO | Admitting: Internal Medicine

## 2012-05-20 VITALS — BP 110/66 | HR 58 | Temp 98.3°F | Resp 12 | Ht 64.0 in | Wt 124.4 lb

## 2012-05-20 DIAGNOSIS — Z0000 Encounter for general adult medical examination without abnormal findings: Secondary | ICD-10-CM

## 2012-05-20 LAB — CBC WITH DIFFERENTIAL/PLATELET
Basophils Relative: 0.6 % (ref 0.0–3.0)
Eosinophils Absolute: 0.1 10*3/uL (ref 0.0–0.7)
HCT: 46.6 % — ABNORMAL HIGH (ref 36.0–46.0)
Hemoglobin: 15.6 g/dL — ABNORMAL HIGH (ref 12.0–15.0)
Lymphocytes Relative: 36.9 % (ref 12.0–46.0)
MCHC: 33.4 g/dL (ref 30.0–36.0)
MCV: 94.8 fl (ref 78.0–100.0)
Monocytes Absolute: 0.4 10*3/uL (ref 0.1–1.0)
Neutro Abs: 2.4 10*3/uL (ref 1.4–7.7)
RBC: 4.92 Mil/uL (ref 3.87–5.11)

## 2012-05-20 LAB — BASIC METABOLIC PANEL
CO2: 28 mEq/L (ref 19–32)
Chloride: 105 mEq/L (ref 96–112)
Potassium: 4 mEq/L (ref 3.5–5.1)
Sodium: 138 mEq/L (ref 135–145)

## 2012-05-20 LAB — HEPATIC FUNCTION PANEL
Albumin: 4.5 g/dL (ref 3.5–5.2)
Alkaline Phosphatase: 61 U/L (ref 39–117)
Total Protein: 7.3 g/dL (ref 6.0–8.3)

## 2012-05-20 LAB — LIPID PANEL: HDL: 70 mg/dL (ref >39.00)

## 2012-05-20 NOTE — Progress Notes (Signed)
  Subjective:    Patient ID: Brittany Myers, female    DOB: 11-05-1950, 61 y.o.   MRN: [id number]  HPI  Brittany Myers is here for a physical; she denies acute issues.      Review of Systems She denies exertional chest pain or significant palpitations at this time. She states occasionally she'll have some chest tightness as if she cannot get a deep breath. The prior workup by Dr Eden Emms was reviewed. She did not fill the prescription for the beta blocker as the PVCs noted on Holter resolved. She did not complete the nuclear stress test as she has a high deductible and this would cost her several thousand dollars.     Objective:   Physical Exam Gen.: Thin but healthy and well-nourished in appearance. Alert, appropriate and cooperative throughout exam. Head: Normocephalic without obvious abnormalities  Eyes: No corneal or conjunctival inflammation noted. Pupils equal round reactive to light and accommodation. Fundal exam is benign without hemorrhages, exudate, papilledema. Extraocular motion intact. Vision grossly normal. Ears: External  ear exam reveals no significant lesions or deformities. Canals clear .TMs normal. Hearing is grossly normal bilaterally. Nose: External nasal exam reveals no deformity or inflammation. Nasal mucosa are pink and moist. No lesions or exudates noted.   Mouth: Oral mucosa and oropharynx reveal no lesions or exudates. Teeth in good repair. Neck: No deformities, masses, or tenderness noted. Range of motion decreased laterally. Thyroid normal. Lungs: Normal respiratory effort; chest expands symmetrically. Lungs are clear to auscultation without rales, wheezes, or increased work of breathing. Heart: Normal rate and rhythm. Normal S1 and S2. No gallop, click, or rub. S4 w/o murmur. Abdomen: Bowel sounds normal; abdomen soft and nontender. No masses, organomegaly or hernias noted. Genitalia: as per Gyn                                              Musculoskeletal/extremities: No  deformity or scoliosis noted of  the thoracic or lumbar spine. No clubbing, cyanosis, edema, or deformity noted. Range of motion  normal .Tone & strength  normal.Joints normal. Nail health  good. Vascular: Carotid, radial artery, dorsalis pedis and  posterior tibial pulses are full and equal. No bruits present. Neurologic: Alert and oriented x3. Deep tendon reflexes symmetrical and normal.         Skin: Intact without suspicious lesions or rashes. Lymph: No cervical, axillary lymphadenopathy present. Psych: Mood and affect are normal. Normally interactive                                                                                         Assessment & Plan:  #1 comprehensive physical exam; no acute findings #2 see Problem List with Assessments & Recommendations. I asked her to keep a diary of her atypical chest symptoms in any palpitations. If these are persistent or recurrent; I encourage the stress test recommended by the cardiologist. Plan: see Orders

## 2012-05-20 NOTE — Patient Instructions (Addendum)
To prevent palpitations or premature beats, avoid stimulants such as decongestants, diet pills, nicotine, or caffeine (coffee, tea, cola, or chocolate) to excess.  Take the EKG to any emergency room or preop visits. There are nonspecific changes; as long as there is no new change these are not clinically significant.  If you activate My Chart; the results can be released to you as soon as they populate from the lab. If you choose not to use this program; the labs have to be reviewed, copied & mailed   causing a delay in getting the results to you.

## 2012-05-20 NOTE — Assessment & Plan Note (Signed)
EKG reveals nonspecific T changes which are stable compared to 03/07/11. Unifocal PVCs noted on that date have resolved. She is essentially asymptomatic at this time except for rare fluttering.

## 2012-05-21 LAB — VITAMIN D 25 HYDROXY (VIT D DEFICIENCY, FRACTURES): Vit D, 25-Hydroxy: 58 ng/mL (ref 30–89)

## 2012-07-06 ENCOUNTER — Encounter: Payer: Self-pay | Admitting: Internal Medicine

## 2012-07-07 ENCOUNTER — Other Ambulatory Visit: Payer: Self-pay

## 2012-07-07 MED ORDER — EPINEPHRINE 0.3 MG/0.3ML IJ DEVI: 0.3000 mg | Freq: Once | INTRAMUSCULAR | Status: DC

## 2012-07-07 NOTE — Telephone Encounter (Signed)
Per Dr.Hopper ok to rx Epi-Pen #1 with one refill (refer to MyChart message if further info needed)

## 2012-07-14 ENCOUNTER — Ambulatory Visit (INDEPENDENT_AMBULATORY_CARE_PROVIDER_SITE_OTHER): Payer: BC Managed Care – PPO

## 2012-07-14 DIAGNOSIS — Z23 Encounter for immunization: Secondary | ICD-10-CM

## 2012-08-09 ENCOUNTER — Encounter: Payer: Self-pay | Admitting: Internal Medicine

## 2012-08-11 ENCOUNTER — Telehealth: Payer: Self-pay | Admitting: Internal Medicine

## 2012-08-11 NOTE — Telephone Encounter (Signed)
Please advise 

## 2012-08-11 NOTE — Telephone Encounter (Signed)
Clarification is needed at her scheduled office visit. If this is truly fluttering on a daily basis then a 24-hour Holter may be of benefit diagnostically

## 2012-08-11 NOTE — Telephone Encounter (Signed)
Patient calling, has had ongoing chest issues since seeing Dr. Alwyn Ren in August.  Has been keeping a calender since he advised her to do so.  Almost daily has had fluttering or the  feeling that she cannot a deep breath or when she takes a deep breath she ends up coughing.  The cough is dry.  Denies any swelling in her feet/ankles, hands, face or waist.  Does have clavicular swelling on the right side and states that MD checked same at the office visit in August.   She has gained weight, about 4 #.   Had an MRI 2 weeks ago for pain in her neck and nothing found to cause the sx.   Triaged per Cough.  Needs to be seen in 24 hours.  Scheduled for 0945 Thursday with Dr. Beverely Low.

## 2012-08-11 NOTE — Telephone Encounter (Signed)
msg left to call the office     KP 

## 2012-08-12 ENCOUNTER — Ambulatory Visit (INDEPENDENT_AMBULATORY_CARE_PROVIDER_SITE_OTHER): Payer: BC Managed Care – PPO | Admitting: Internal Medicine

## 2012-08-12 ENCOUNTER — Ambulatory Visit: Payer: Self-pay | Admitting: Family Medicine

## 2012-08-12 ENCOUNTER — Ambulatory Visit (INDEPENDENT_AMBULATORY_CARE_PROVIDER_SITE_OTHER)
Admission: RE | Admit: 2012-08-12 | Discharge: 2012-08-12 | Disposition: A | Discharge status: Home / Self Care | Payer: BC Managed Care – PPO | Source: Ambulatory Visit | Attending: Internal Medicine | Admitting: Internal Medicine

## 2012-08-12 ENCOUNTER — Encounter: Payer: Self-pay | Admitting: Internal Medicine

## 2012-08-12 VITALS — BP 116/68 | HR 74 | Temp 98.1°F | Wt 128.0 lb

## 2012-08-12 DIAGNOSIS — R1310 Dysphagia, unspecified: Secondary | ICD-10-CM

## 2012-08-12 DIAGNOSIS — R05 Cough: Secondary | ICD-10-CM

## 2012-08-12 MED ORDER — OMEPRAZOLE 20 MG PO CPDR: 20.0000 mg | DELAYED_RELEASE_CAPSULE | Freq: Every day | ORAL | Status: DC

## 2012-08-12 NOTE — Telephone Encounter (Signed)
Spoke with patient, patient states she would rather see Dr.Hopper if at all possible. Patient was switched from Dr.Tabori to Dr.Hopper

## 2012-08-12 NOTE — Patient Instructions (Addendum)
The triggers for reflux  include stress; the "aspirin family" ; alcohol; peppermint; and caffeine (coffee, tea, cola, and chocolate). The aspirin family would include aspirin and the nonsteroidal agents such as ibuprofen &  Naproxen. Tylenol would not cause reflux. If having symptoms ; food & drink should be avoided for @ least 2 hours before going to bed.  

## 2012-08-12 NOTE — Progress Notes (Signed)
  Subjective:    Patient ID: Brittany Myers, female    DOB: [birth year], 61 y.o.   MRN: [id number]  HPI Cough Onset:since 02/2011 intermittently Trigger: no Course:stable but daily Duration: seconds Character:  Dry , worse with deep breath Treatment/efficacy:no treatment to date    Review of Systems Associated symptoms/signs:  URI symptoms: No facial pain, frontal headaches, nasal purulence, dental pain,sorethroat, or ear ache/otic discharge Extrinsic symptoms: No itchy eyes, sneezing, or angioedema Infectious symptoms: No fever, chills, sweats, and purulent secretions Chest symptoms: No pleuritic pain, sputum production, hemoptysis, dyspnea, or wheezing GI symptoms: No dyspepsia. Occasional dysphagia. Occupational/environmental exposures:no Smoking history:never ACE inhibitor administration:no Past medical history/family history pulmonary disease: no     Objective:   Physical Exam General appearance:Thin but in good health ;well nourished; no acute distress or increased work of breathing is present.  No  lymphadenopathy about the head, neck, or axilla noted.   Eyes: No conjunctival inflammation or lid edema is present.   Ears:  External ear exam shows no significant lesions or deformities.  Otoscopic examination reveals clear canals, tympanic membranes are intact bilaterally without bulging, retraction, inflammation or discharge.  Nose:  External nasal examination shows no deformity or inflammation. Nasal mucosa are pink and moist without lesions or exudates. No septal dislocation or deviation.No obstruction to airflow.   Oral exam: Dental hygiene is good; lips and gums are healthy appearing.There is no oropharyngeal erythema or exudate noted.   Neck:  No deformities, thyromegaly, masses, or tenderness noted.   Supple with full range of motion without pain.   Heart:  Normal rate and regular rhythm. S1 and S2 normal without gallop, murmur, click, rub or other extra sounds.    Lungs:Chest clear to auscultation; no wheezes, rhonchi,rales ,or rubs present.No increased work of breathing.    Extremities:  No cyanosis, edema, or clubbing  noted    Skin: Warm & dry           Assessment & Plan:  Cough, protracted  #2 intermittent dysphagia  Plan: See orders and recommendations

## 2012-08-17 ENCOUNTER — Encounter: Payer: Self-pay | Admitting: Internal Medicine

## 2012-08-18 ENCOUNTER — Encounter: Payer: Self-pay | Admitting: Internal Medicine

## 2012-09-08 ENCOUNTER — Other Ambulatory Visit: Payer: Self-pay | Admitting: Dermatology

## 2012-09-14 ENCOUNTER — Encounter: Payer: Self-pay | Admitting: Internal Medicine

## 2012-10-22 ENCOUNTER — Ambulatory Visit (INDEPENDENT_AMBULATORY_CARE_PROVIDER_SITE_OTHER): Payer: BC Managed Care – PPO | Admitting: Internal Medicine

## 2012-10-22 ENCOUNTER — Encounter: Payer: Self-pay | Admitting: Internal Medicine

## 2012-10-22 VITALS — BP 108/70 | HR 95 | Temp 98.4°F | Resp 12 | Wt 128.8 lb

## 2012-10-22 DIAGNOSIS — J069 Acute upper respiratory infection, unspecified: Secondary | ICD-10-CM

## 2012-10-22 DIAGNOSIS — J209 Acute bronchitis, unspecified: Secondary | ICD-10-CM

## 2012-10-22 MED ORDER — SULFAMETHOXAZOLE-TRIMETHOPRIM 800-160 MG PO TABS: 1.0000 | ORAL_TABLET | Freq: Two times a day (BID) | ORAL | Status: DC

## 2012-10-22 MED ORDER — FLUTICASONE PROPIONATE 50 MCG/ACT NA SUSP: 1.0000 | Freq: Two times a day (BID) | NASAL | Status: DC | PRN

## 2012-10-22 MED ORDER — BENZONATATE 200 MG PO CAPS: 200.0000 mg | ORAL_CAPSULE | Freq: Three times a day (TID) | ORAL | Status: DC | PRN

## 2012-10-22 NOTE — Patient Instructions (Addendum)
Plain Mucinex (NOT D) for thick secretions ;force NON dairy fluids .   Nasal cleansing in the shower as discussed with lather of mild shampoo.After 10 seconds wash off lather while  exhaling through nostrils. Make sure that all residual soap is removed to prevent irritation.  Fluticasone 1 spray in each nostril twice a day as needed. Use the "crossover" technique into opposite nostril spraying toward opposite ear @ 45 degree angle, not straight up into nostril.  Use a Neti pot daily only  as needed for significant sinus congestion; going from open side to congested side . Plain Allegra (NOT D )  160 daily , Loratidine 10 mg , OR Zyrtec 10 mg @ bedtime  as needed for itchy eyes & sneezing.    To prevent sleep dysfunction follow these instructions for sleep hygiene. Do not read, watch TV, or eat in bed. Do not get into bed until you are ready to turn off the light &  to go to sleep. Do not ingest stimulants ( decongestants, diet pills, nicotine, caffeine) after the evening meal.

## 2012-10-22 NOTE — Progress Notes (Signed)
  Subjective:    Patient ID: Brittany Myers, female    DOB: 1951-05-16, 62 y.o.   MRN: [id number]  HPI The respiratory tract symptoms began 10/16/12 as itchy throat ,head congestion  , rhinitis,sneezing & dry cough .  Significant active  associated symptoms include frontal headache, facial pain,  & dental pain. These are gone as of today Cough is not associated with  shortness of breath and wheezing . Sputum is  described as green as of 1/22.  Fever to 99.6 w/o chills and sweats  present   Myalgias and arthralgias were present; ? from sleeping on sofa.  Flu shot  current     Extrinsic symptoms of itchy, watery eyes, & sneezing were all present .     Treatment with   Tylenol,Neti pot, vit C was partially effective   There is no history of asthma. The patient had never smoked                    Review of Systems Symptoms not present include sore throat, nasal purulence, earache , and otic discharge       Objective:   Physical Exam General appearance:good health ;well nourished; no acute distress or increased work of breathing is present.  No  lymphadenopathy about the head, neck, or axilla noted.  Eyes: No conjunctival inflammation or lid edema is present.  Ears:  External ear exam shows no significant lesions or deformities.  Otoscopic examination reveals clear canals, tympanic membranes are intact bilaterally without bulging, retraction, inflammation or discharge. Nose:  External nasal examination shows no deformity or inflammation. Nasal mucosa are dry without lesions or exudates. No septal dislocation or deviation.No obstruction to airflow.  Oral exam: Dental hygiene is good; lips and gums are healthy appearing.There is no oropharyngeal erythema or exudate noted. Hoarse Neck:  No deformities,  masses, or tenderness noted.   Heart:  Normal rate and regular rhythm. S1 and S2 normal without gallop, murmur, click, rub or other extra sounds.  Lungs:Chest clear to  auscultation; no wheezes, rhonchi,rales ,or rubs present.No increased work of breathing.   Extremities:  No cyanosis, edema, or clubbing  noted  Skin: Warm & dry .        Assessment & Plan:  #1 acute bronchitis with URI but w/o bronchospasm Plan: See orders and recommendations

## 2012-10-24 ENCOUNTER — Encounter: Payer: Self-pay | Admitting: Internal Medicine

## 2012-10-25 ENCOUNTER — Telehealth: Payer: Self-pay | Admitting: Internal Medicine

## 2012-10-25 NOTE — Telephone Encounter (Signed)
[facility] Triage Call Report Triage Record Num: [id number] Operator: April Finney Patient Name: Brittany Myers Call Date & Time: 10/23/2012 3:25:49PM Patient Phone: [phone] PCP: Brittany Myers Patient Gender: Female PCP Fax : [phone] Patient DOB: 10-03-1950 Practice Name: [facility] Reason for Call: Caller: [name]; PCP: Brittany Myers; CB#: [phone]; Call regarding Nausea and headache; seen in office on 1/24; She is taking Sulfa-Methoxazole TMP/DS, Flonase for URI. Sh now has nausea and headache and wants to know if it's ok to take a Tylenol. Afebrile. No difficulty with breathing. No emergent symptoms. See in 24 hrs care advice given. Protocol(s) Used: Upper Respiratory Infection (URI) Recommended Outcome per Protocol: See Provider within 24 hours Reason for Outcome: Mild to moderate headache for more than 24 hours unrelieved with nonprescription medications Care Advice: Call provider if headache worsens, develops temperature greater than 101.8F (38.1C) or any temperature elevation in a geriatric or immunocompromised patient (such as diabetes, HIV/AIDS, chemotherapy, organ transplant, or chronic steroid use). ~ Drink more fluids -- water, low-sugar juices, tea and warm soup, especially chicken broth, are options. Avoid caffeinated or alcoholic beverages because they can increase the chance of dehydration. ~ ~ SYMPTOM / CONDITION MANAGEMENT ~ CAUTIONS A warm, moist compress placed on face, over eyes for 15 to 20 minutes, 5 to 6 times a day, may help relieve the congestion. ~ Analgesic/Antipyretic Advice - Acetaminophen: Consider acetaminophen as directed on label or by pharmacist/provider for pain or fever. PRECAUTIONS: - Use only if there is no history of liver disease, alcoholism, or intake of three or more alcohol drinks per day. - If approved by provider when breastfeeding. - Do not exceed recommended dose or frequency. ~ 10/23/2012  3:41:35PM Page 1 of 1 [id number]

## 2012-10-25 NOTE — Telephone Encounter (Signed)
Patient sent Mychart message, MD followed up with patient via Mychart

## 2013-01-03 ENCOUNTER — Ambulatory Visit: Payer: BC Managed Care – PPO | Admitting: Internal Medicine

## 2013-02-17 ENCOUNTER — Telehealth: Payer: Self-pay | Admitting: General Practice

## 2013-02-17 MED ORDER — LORAZEPAM 0.5 MG PO TABS: 0.5000 mg | ORAL_TABLET | Freq: Every evening | ORAL | Status: DC | PRN

## 2013-02-17 NOTE — Telephone Encounter (Signed)
Pt called stating that she is working on a new project for work and cannot sleep. Pt states that she has tried benadryl and zyrtec to help with sleeping but they leave her feeling groggy the entire next day. Pt would like to know if it is possible for you to send in a prescription for Lorazepam, you had prescribed it for her before. Pt states that she works in [facility] and is not in the area for an appt. Please advise   Please send to [facility] on [address] if ok to fill?

## 2013-02-17 NOTE — Telephone Encounter (Signed)
Discuss with patient, Rx sent. 

## 2013-02-17 NOTE — Telephone Encounter (Signed)
Lorazepam 0.5 mg qhs prn # 30 ;preferabe would be one at bedtime every third night as needed

## 2013-07-15 ENCOUNTER — Telehealth: Payer: Self-pay

## 2013-07-15 NOTE — Telephone Encounter (Signed)
Medication and allergies: done  90 day supply/mail order: none Local pharmacy: [facility] [address]   Immunizations due:  Admin flu vaccine upon arrival  A/P:  Last:  PAP:  MMG: UTD 06/2013  Dexa: UTD 06/2011  CCS: UTD 02/2012 DM: Due 04/2012 Eye Exam:    HTN: Due 04/2012 Lipids: Due 04/2012  Recent family history or surgical procedures: none  To Discuss with Provider: Larey Seat Friday 07/08/2013-still lots of bruising-splinted it herself

## 2013-07-17 ENCOUNTER — Encounter: Payer: Self-pay | Admitting: Internal Medicine

## 2013-07-19 ENCOUNTER — Encounter: Payer: Self-pay | Admitting: Internal Medicine

## 2013-07-19 ENCOUNTER — Ambulatory Visit (INDEPENDENT_AMBULATORY_CARE_PROVIDER_SITE_OTHER)
Admission: RE | Admit: 2013-07-19 | Discharge: 2013-07-19 | Disposition: A | Discharge status: Home / Self Care | Payer: BC Managed Care – PPO | Source: Ambulatory Visit | Attending: Internal Medicine | Admitting: Internal Medicine

## 2013-07-19 ENCOUNTER — Ambulatory Visit (INDEPENDENT_AMBULATORY_CARE_PROVIDER_SITE_OTHER): Payer: BC Managed Care – PPO | Admitting: Internal Medicine

## 2013-07-19 VITALS — BP 112/72 | HR 60 | Temp 98.0°F | Resp 16 | Wt 125.0 lb

## 2013-07-19 DIAGNOSIS — M79642 Pain in left hand: Secondary | ICD-10-CM

## 2013-07-19 DIAGNOSIS — M8580 Other specified disorders of bone density and structure, unspecified site: Secondary | ICD-10-CM

## 2013-07-19 DIAGNOSIS — M79609 Pain in unspecified limb: Secondary | ICD-10-CM

## 2013-07-19 DIAGNOSIS — M899 Disorder of bone, unspecified: Secondary | ICD-10-CM

## 2013-07-19 DIAGNOSIS — Z0000 Encounter for general adult medical examination without abnormal findings: Secondary | ICD-10-CM

## 2013-07-19 LAB — HEPATIC FUNCTION PANEL
ALT: 17 U/L (ref 0–35)
AST: 22 U/L (ref 0–37)
Albumin: 4.3 g/dL (ref 3.5–5.2)
Bilirubin, Direct: 0.1 mg/dL (ref 0.0–0.3)
Total Bilirubin: 0.8 mg/dL (ref 0.3–1.2)

## 2013-07-19 LAB — BASIC METABOLIC PANEL
BUN: 16 mg/dL (ref 6–23)
Chloride: 104 mEq/L (ref 96–112)
Potassium: 4.5 mEq/L (ref 3.5–5.1)
Sodium: 139 mEq/L (ref 135–145)

## 2013-07-19 LAB — CBC WITH DIFFERENTIAL/PLATELET
Basophils Relative: 0.6 % (ref 0.0–3.0)
Eosinophils Relative: 2.8 % (ref 0.0–5.0)
HCT: 45.6 % (ref 36.0–46.0)
Hemoglobin: 15.7 g/dL — ABNORMAL HIGH (ref 12.0–15.0)
Lymphs Abs: 1.6 10*3/uL (ref 0.7–4.0)
MCV: 93 fl (ref 78.0–100.0)
Monocytes Absolute: 0.3 10*3/uL (ref 0.1–1.0)
Neutro Abs: 2.8 10*3/uL (ref 1.4–7.7)
RBC: 4.91 Mil/uL (ref 3.87–5.11)
WBC: 4.9 10*3/uL (ref 4.5–10.5)

## 2013-07-19 LAB — LIPID PANEL
Cholesterol: 194 mg/dL (ref 0–200)
HDL: 73.1 mg/dL (ref >39.00)
Total CHOL/HDL Ratio: 3
Triglycerides: 60 mg/dL (ref 0.0–149.0)

## 2013-07-19 NOTE — Progress Notes (Signed)
  Subjective:    Patient ID: Brittany Myers, female    DOB: 1951-09-15, 62 y.o.   MRN: [id number]  HPI  She is here for a physical;acute issues denied.     Review of Systems  She's had intermittent  "silent reflux" which she treats by dietary interventions with response. She's hesitant to use omeprazole for fear of impact on her bones. She does have significant osteopenia with a T score of -2.1. She denies significant dyspepsia, dysphagia, abdominal pain, unexplained weight loss, melena, or rectal bleeding.  She fell 2 weeks ago landing on her left hand. There was significant swelling and ecchymosis which is resolving . She has residual tenderness at the base of the left fifth digit with associated weakness of the hand.     Objective:   Physical Exam  Gen.: Thin but healthy and well-nourished in appearance. Alert, appropriate and cooperative throughout exam. Appears younger than stated age  Head: Normocephalic without obvious abnormalities  Eyes: No corneal or conjunctival inflammation noted. Pupils equal round reactive to light and accommodation.  Extraocular motion intact.  Ears: External  ear exam reveals no significant lesions or deformities. Canals clear .TMs normal.  Nose: External nasal exam reveals no deformity or inflammation. Nasal mucosa are pink and moist. No lesions or exudates noted.  Mouth: Oral mucosa and oropharynx reveal no lesions or exudates. Teeth in good repair. Neck: No deformities, masses, or tenderness noted. Range of motion &Thyroid  normal. Lungs: Normal respiratory effort; chest expands symmetrically. Lungs are clear to auscultation without rales, wheezes, or increased work of breathing. Heart: Normal rate and rhythm. Normal S1 and S2. No gallop, click, or rub. S4 w/o murmur. Abdomen: Bowel sounds normal; abdomen soft and nontender. No masses, organomegaly or hernias noted. Aorta palpable ; no AAA Genitalia: As per Gyn                                   Musculoskeletal/extremities: No deformity or scoliosis noted of  the thoracic or lumbar spine.  No clubbing, cyanosis, edema, or significant extremity  deformity noted. Range of motion normal .Tone & strength  Normal. Joints normal ; marked tenderness of the MCP joint of the left fifth digit. Nail health good. Able to lie down & sit up w/o help. Negative SLR bilaterally Vascular: Carotid, radial artery, dorsalis pedis and  posterior tibial pulses are full and equal. No bruits present. Neurologic: Alert and oriented x3. Deep tendon reflexes symmetrical and normal.        Skin: Intact without suspicious lesions or rashes. Lymph: No cervical, axillary lymphadenopathy present. Psych: Mood and affect are normal. Normally interactive                                                                                       Assessment & Plan:  #1 comprehensive physical exam; no acute findings #2 hand pain post fall ; R/O fracture  Plan: see Orders  & Recommendations

## 2013-07-19 NOTE — Patient Instructions (Addendum)
Reflux of gastric acid may be asymptomatic as this may occur mainly during sleep.The triggers for reflux  include stress; the "aspirin family" ; alcohol; peppermint; and caffeine (coffee, tea, cola, and chocolate). The aspirin family would include aspirin and the nonsteroidal agents such as ibuprofen &  Naproxen. Tylenol would not cause reflux. If having symptoms ; food & drink should be avoided for @ least 2 hours before going to bed.  Take the EKG to any emergency room or preop visits. There are nonspecific changes; as long as there is no new change these are not clinically significant . If the old EKG is not available for comparison; it may result in unnecessary hospitalization for observation with significant unnecessary expense. Order for x-rays entered into  the computer; these will be performed at [address] [facility]. across from [facility]. No appointment is necessary.

## 2013-07-20 ENCOUNTER — Ambulatory Visit (INDEPENDENT_AMBULATORY_CARE_PROVIDER_SITE_OTHER): Payer: BC Managed Care – PPO

## 2013-07-20 DIAGNOSIS — Z23 Encounter for immunization: Secondary | ICD-10-CM

## 2013-07-21 ENCOUNTER — Ambulatory Visit: Payer: BC Managed Care – PPO

## 2013-07-21 DIAGNOSIS — R7309 Other abnormal glucose: Secondary | ICD-10-CM

## 2013-07-21 LAB — HEMOGLOBIN A1C: Hgb A1c MFr Bld: 5.6 % (ref 4.6–6.5)

## 2013-07-24 LAB — VITAMIN D 1,25 DIHYDROXY
Vitamin D 1, 25 (OH)2 Total: 92 pg/mL — ABNORMAL HIGH (ref 18–72)
Vitamin D2 1, 25 (OH)2: <8 pg/mL
Vitamin D3 1, 25 (OH)2: 92 pg/mL

## 2013-08-08 ENCOUNTER — Encounter: Payer: Self-pay | Admitting: Internal Medicine

## 2013-08-09 NOTE — Telephone Encounter (Signed)
Please advise 

## 2013-08-19 ENCOUNTER — Encounter: Payer: Self-pay | Admitting: Internal Medicine

## 2013-09-13 ENCOUNTER — Encounter: Payer: Self-pay | Admitting: Internal Medicine

## 2013-09-26 ENCOUNTER — Other Ambulatory Visit: Payer: Self-pay | Admitting: Internal Medicine

## 2013-09-26 NOTE — Telephone Encounter (Signed)
Ok X1 

## 2013-09-26 NOTE — Telephone Encounter (Signed)
Lorazepam 0.5 mg prn qhs q 3rd night Last refill: 03/20/13 Last OV: 07/19/13 No contract on file

## 2013-09-27 ENCOUNTER — Encounter: Payer: Self-pay | Admitting: *Deleted

## 2013-09-27 NOTE — Telephone Encounter (Signed)
Called and left message for patient informing her that her prescription for Lorazepam is ready for pick up at our front office. Patient will need to read and sign controlled substance contract and provide UDS. JG//CMA

## 2013-11-28 ENCOUNTER — Ambulatory Visit (INDEPENDENT_AMBULATORY_CARE_PROVIDER_SITE_OTHER): Payer: BC Managed Care – PPO | Admitting: Internal Medicine

## 2013-11-28 ENCOUNTER — Encounter: Payer: Self-pay | Admitting: Internal Medicine

## 2013-11-28 VITALS — BP 90/70 | HR 76 | Temp 98.1°F | Resp 12 | Wt 129.0 lb

## 2013-11-28 DIAGNOSIS — J111 Influenza due to unidentified influenza virus with other respiratory manifestations: Secondary | ICD-10-CM

## 2013-11-28 MED ORDER — OSELTAMIVIR PHOSPHATE 75 MG PO CAPS: 75.0000 mg | ORAL_CAPSULE | Freq: Two times a day (BID) | ORAL | Status: DC

## 2013-11-28 NOTE — Progress Notes (Signed)
Pre visit review using our clinic review tool, if applicable. No additional management support is needed unless otherwise documented below in the visit note. 

## 2013-11-28 NOTE — Progress Notes (Signed)
   Subjective:    Patient ID: Brittany Myers, female    DOB: [birth year], 63 y.o.   MRN: [id number]  HPI   She the acute onset of severe sore throat and diffuse "whole body aching" 11/23/13. She states that she's basically been couch/bedridden since that time.  The onset was sudden and the musculoskeletal complaints severe  Additionally she's had some sneezing. She describes forehead pain and scant yellow-green nasal discharge.  She's also had pain in the teeth as well as some otic pain.  The cough is nonproductive  She's had low-grade fever and chills as well  Despite Tylenol, vitamin C,& zinc lozenges there has not been significant improvement in her symptoms  Her major concern is that she'll be leaving the country next week.    Review of Systems  She is not having significant itchy, watery eyes.  She is not having facial pain.  There is no otic discharge  As stated there is no sputum production or associated shortness of breath or wheezing.     Objective:   Physical Exam General appearance:thin  But in good health ;well nourished; no acute distress or increased work of breathing is present.  No  lymphadenopathy about the head, neck, or axilla noted.   Eyes: No conjunctival inflammation or lid edema is present. There is no scleral icterus.  Ears:  External ear exam shows no significant lesions or deformities.  Otoscopic examination reveals clear canals, tympanic membranes are intact bilaterally without bulging, retraction, inflammation or discharge.  Nose:  External nasal examination shows no deformity or inflammation. Nasal mucosa are pink and moist without lesions or exudates. No septal dislocation or deviation.No obstruction to airflow.   Oral exam: Dental hygiene is good; lips and gums are healthy appearing.There is no oropharyngeal erythema or exudate noted.Hoarse   Neck:  No deformities, thyromegaly, masses, or tenderness noted.   Supple with full range of motion  without pain.   Heart:  Normal rate and regular rhythm. S1 and S2 normal without gallop, murmur, click, rub or other extra sounds. S4  Lungs:Chest clear to auscultation; no wheezes, rhonchi,rales ,or rubs present.No increased work of breathing.    Extremities:  No cyanosis, edema, or clubbing  noted    Skin: Warm & dry w/o jaundice or tenting.         Assessment & Plan:  #1 influenza syndrome See orders

## 2013-11-28 NOTE — Patient Instructions (Signed)
Plain Mucinex (NOT D) for thick secretions ;force NON dairy fluids .   Nasal cleansing in the shower as discussed with lather of mild shampoo.After 10 seconds wash off lather while  exhaling through nostrils. Make sure that all residual soap is removed to prevent irritation.  Flonase OR Nasacort AQ 1 spray in each nostril twice a day as needed. Use the "crossover" technique into opposite nostril spraying toward opposite ear @ 45 degree angle, not straight up into nostril.  Use a Neti pot daily only  as needed for significant sinus congestion; going from open side to congested side . Plain Allegra (NOT D )  160 daily , Loratidine 10 mg , OR Zyrtec 10 mg @ bedtime  as needed for itchy eyes & sneezing. NSAIDS ( Aleve, Advil, Naproxen) or Tylenol every 4 hrs as needed for fever as discussed based on label recommendations

## 2013-11-30 ENCOUNTER — Other Ambulatory Visit: Payer: Self-pay | Admitting: Internal Medicine

## 2013-11-30 MED ORDER — FAMCICLOVIR 500 MG PO TABS: ORAL_TABLET | ORAL | Status: DC

## 2013-12-01 ENCOUNTER — Encounter: Payer: Self-pay | Admitting: Internal Medicine

## 2013-12-02 ENCOUNTER — Encounter: Payer: Self-pay | Admitting: Internal Medicine

## 2013-12-05 ENCOUNTER — Encounter: Payer: Self-pay | Admitting: Internal Medicine

## 2013-12-05 ENCOUNTER — Other Ambulatory Visit: Payer: Self-pay | Admitting: Internal Medicine

## 2013-12-05 DIAGNOSIS — R05 Cough: Secondary | ICD-10-CM

## 2013-12-05 DIAGNOSIS — R059 Cough, unspecified: Secondary | ICD-10-CM

## 2013-12-06 ENCOUNTER — Ambulatory Visit (INDEPENDENT_AMBULATORY_CARE_PROVIDER_SITE_OTHER): Payer: BC Managed Care – PPO | Admitting: Internal Medicine

## 2013-12-06 ENCOUNTER — Ambulatory Visit (INDEPENDENT_AMBULATORY_CARE_PROVIDER_SITE_OTHER)
Admission: RE | Admit: 2013-12-06 | Discharge: 2013-12-06 | Disposition: A | Discharge status: Home / Self Care | Payer: BC Managed Care – PPO | Source: Ambulatory Visit | Attending: Internal Medicine | Admitting: Internal Medicine

## 2013-12-06 ENCOUNTER — Encounter: Payer: Self-pay | Admitting: Internal Medicine

## 2013-12-06 VITALS — BP 100/70 | HR 73 | Temp 99.0°F | Resp 15 | Wt 130.1 lb

## 2013-12-06 DIAGNOSIS — R059 Cough, unspecified: Secondary | ICD-10-CM

## 2013-12-06 DIAGNOSIS — R05 Cough: Secondary | ICD-10-CM

## 2013-12-06 MED ORDER — HYDROCODONE-HOMATROPINE 5-1.5 MG/5ML PO SYRP: 5.0000 mL | ORAL_SOLUTION | Freq: Four times a day (QID) | ORAL | Status: DC | PRN

## 2013-12-06 MED ORDER — PREDNISONE 20 MG PO TABS: 20.0000 mg | ORAL_TABLET | Freq: Two times a day (BID) | ORAL | Status: DC

## 2013-12-06 NOTE — Progress Notes (Signed)
Pre visit review using our clinic review tool, if applicable. No additional management support is needed unless otherwise documented below in the visit note. 

## 2013-12-06 NOTE — Patient Instructions (Signed)
Start with prednisone 20 mg twice a day with meals. The dose can be decreased by one half every 72 hours if symptoms are quiescent.

## 2013-12-06 NOTE — Progress Notes (Signed)
   Subjective:    Patient ID: Brittany Myers, female    DOB: 03-22-1951, 63 y.o.   MRN: [id number]  HPI  Following the Tamiflu for flu like syndrome despite having had the flu shot; symptoms have improved except for a lingering nonproductive cough. This has caused thoracic discomfort. NyQuil hasn't been of some benefit; generic Tessalon was of no help.  She has paroxysmal spells lasting one-2 minutes.  She's had some associated rhinitis.  She's also noted some sneezing.  Chest x-ray performed this morning was reviewed; this revealed no active disease      Review of Systems  Specifically she denies itchy, watery eyes; fever; chills; sweats; pleuritic chest pain; purulent nasal secretions; or purulent sputum. She is also not having frontal or facial sinus pain. The cough is not associated with wheezing or shortness of breath.No ERD symptoms.     Objective:   Physical Exam  General appearance:good health ;well nourished; no acute distress or increased work of breathing is present.  No  lymphadenopathy about the head, neck, or axilla noted.   Eyes: No conjunctival inflammation or lid edema is present.   Ears:  External ear exam shows no significant lesions or deformities.  Otoscopic examination reveals clear canals, tympanic membranes are intact bilaterally without bulging, retraction, inflammation or discharge.  Nose:  External nasal examination shows no deformity or inflammation. Nasal mucosa are pink and moist without lesions or exudates. No septal dislocation or deviation.No obstruction to airflow.   Oral exam: Dental hygiene is good; lips and gums are healthy appearing.There is no oropharyngeal erythema or exudate noted. Hoarse  Neck:  No deformities, thyromegaly, masses, or tenderness noted.   Supple with full range of motion without pain.   Heart:  Normal rate and regular rhythm. S1 and S2 normal without gallop, murmur, click, rub or other extra sounds.   Lungs:Chest clear  to auscultation; no wheezes, rhonchi,rales ,or rubs present.No increased work of breathing.    Extremities:  No cyanosis, edema, or clubbing  noted    Skin: Warm & dry        Assessment & Plan:  #1 post flu cough due to some reactive airways.  Plan: See orders

## 2014-01-03 ENCOUNTER — Encounter: Payer: Self-pay | Admitting: Internal Medicine

## 2014-02-27 ENCOUNTER — Encounter: Payer: Self-pay | Admitting: Internal Medicine

## 2014-02-28 ENCOUNTER — Other Ambulatory Visit: Payer: Self-pay | Admitting: Internal Medicine

## 2014-02-28 MED ORDER — EPINEPHRINE 0.3 MG/0.3ML IJ SOAJ: 0.3000 mg | Freq: Once | INTRAMUSCULAR | Status: DC

## 2014-05-26 ENCOUNTER — Telehealth: Payer: Self-pay | Admitting: *Deleted

## 2014-05-26 NOTE — Telephone Encounter (Signed)
[facility] Triage Call Report Triage Record Num: [id number] Operator: Marianne Sofia Patient Name: Brittany Myers Call Date & Time: 05/20/2014 11:30:00PM Patient Phone: [phone] PCP: Unice Cobble Patient Gender: Female PCP Fax : [phone] Patient DOB: December 29, 1950 Practice Name: [facility] Reason for Call: Caller: Harla/Patient; PCP: Unice Cobble; CB#: [phone]; Call regarding insect bite at 19:00 this PM 05/20/14, right buttock, still stinging and itches, now feels itchy all over body, left arm painful since bite, took Benadryl at 21:00 and pain started in left arm at 22:00, asking what to take for severe arm pain, 911 disposition obtained pr Bite Guideline due to left arm/chest pain, she was given disposition,encouraged to go to ED Protocol(s) Used: Bites and Stings - Insects or Spiders Recommended Outcome per Protocol: Activate EMS 911 Reason for Outcome: Signs/symptoms of anaphylaxis Care Advice: ~ 08/

## 2014-07-20 ENCOUNTER — Other Ambulatory Visit: Payer: Self-pay | Admitting: Internal Medicine

## 2014-07-20 ENCOUNTER — Ambulatory Visit (INDEPENDENT_AMBULATORY_CARE_PROVIDER_SITE_OTHER): Payer: BC Managed Care – PPO | Admitting: Internal Medicine

## 2014-07-20 ENCOUNTER — Encounter: Payer: Self-pay | Admitting: Internal Medicine

## 2014-07-20 ENCOUNTER — Other Ambulatory Visit (INDEPENDENT_AMBULATORY_CARE_PROVIDER_SITE_OTHER): Payer: BC Managed Care – PPO

## 2014-07-20 VITALS — BP 114/86 | HR 61 | Temp 98.2°F | Resp 12 | Ht 64.0 in | Wt 127.4 lb

## 2014-07-20 DIAGNOSIS — R9431 Abnormal electrocardiogram [ECG] [EKG]: Secondary | ICD-10-CM

## 2014-07-20 DIAGNOSIS — Z0000 Encounter for general adult medical examination without abnormal findings: Secondary | ICD-10-CM

## 2014-07-20 DIAGNOSIS — Z8249 Family history of ischemic heart disease and other diseases of the circulatory system: Secondary | ICD-10-CM

## 2014-07-20 DIAGNOSIS — Z23 Encounter for immunization: Secondary | ICD-10-CM

## 2014-07-20 DIAGNOSIS — Z0189 Encounter for other specified special examinations: Secondary | ICD-10-CM

## 2014-07-20 DIAGNOSIS — G479 Sleep disorder, unspecified: Secondary | ICD-10-CM

## 2014-07-20 LAB — BASIC METABOLIC PANEL
BUN: 16 mg/dL (ref 6–23)
CO2: 28 meq/L (ref 19–32)
Calcium: 9.5 mg/dL (ref 8.4–10.5)
Chloride: 104 mEq/L (ref 96–112)
Creatinine, Ser: 0.8 mg/dL (ref 0.4–1.2)
GFR: 78.05 mL/min (ref >60.00)
Glucose, Bld: 94 mg/dL (ref 70–99)
Potassium: 4.7 mEq/L (ref 3.5–5.1)
Sodium: 138 mEq/L (ref 135–145)

## 2014-07-20 LAB — CBC WITH DIFFERENTIAL/PLATELET
BASOS ABS: 0 10*3/uL (ref 0.0–0.1)
BASOS PCT: 0.5 % (ref 0.0–3.0)
EOS ABS: 0.1 10*3/uL (ref 0.0–0.7)
Eosinophils Relative: 2.8 % (ref 0.0–5.0)
HCT: 46.1 % — ABNORMAL HIGH (ref 36.0–46.0)
Hemoglobin: 15.4 g/dL — ABNORMAL HIGH (ref 12.0–15.0)
LYMPHS PCT: 33.6 % (ref 12.0–46.0)
Lymphs Abs: 1.8 10*3/uL (ref 0.7–4.0)
MCHC: 33.5 g/dL (ref 30.0–36.0)
MCV: 94 fl (ref 78.0–100.0)
Monocytes Absolute: 0.5 10*3/uL (ref 0.1–1.0)
Monocytes Relative: 8.9 % (ref 3.0–12.0)
Neutro Abs: 2.9 10*3/uL (ref 1.4–7.7)
Neutrophils Relative %: 54.2 % (ref 43.0–77.0)
Platelets: 230 10*3/uL (ref 150.0–400.0)
RBC: 4.9 Mil/uL (ref 3.87–5.11)
RDW: 12.2 % (ref 11.5–15.5)
WBC: 5.3 10*3/uL (ref 4.0–10.5)

## 2014-07-20 LAB — HEPATIC FUNCTION PANEL
ALK PHOS: 69 U/L (ref 39–117)
ALT: 18 U/L (ref 0–35)
AST: 23 U/L (ref 0–37)
Albumin: 3.8 g/dL (ref 3.5–5.2)
BILIRUBIN DIRECT: 0.1 mg/dL (ref 0.0–0.3)
Total Bilirubin: 1.2 mg/dL (ref 0.2–1.2)
Total Protein: 6.9 g/dL (ref 6.0–8.3)

## 2014-07-20 LAB — VITAMIN D 25 HYDROXY (VIT D DEFICIENCY, FRACTURES): VITD: 47.8 ng/mL (ref 30.00–100.00)

## 2014-07-20 LAB — TSH: TSH: 1.49 u[IU]/mL (ref 0.35–4.50)

## 2014-07-20 NOTE — Progress Notes (Signed)
   Subjective:    Patient ID: Brittany Myers, female    DOB: [birth year], 63 y.o.   MRN: [id number]  HPI  She is here for a physical;acute issues include chronic sleep dysfunction without sleep apnea or restless leg components.  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  She is on a heart healthy diet usually unless work requires alteration in diet  She exercises at least 3 hours a week at the gym or yoga or running. She has no symptoms with this  There is no family history of premature heart attack or stroke. Her lipid panel has been protected.     Review of Systems   Chest pain, palpitations, tachycardia, exertional dyspnea, paroxysmal nocturnal dyspnea, claudication or edema are absent.       Objective:   Physical Exam  Gen.: Healthy and well-nourished in appearance. Alert, appropriate and cooperative throughout exam. Appears younger than stated age  Head: Normocephalic without obvious abnormalities;  Eyes: No corneal or conjunctival inflammation noted. Pupils equal round reactive to light and accommodation. Extraocular motion intact. Ears: External  ear exam reveals no significant lesions or deformities. Canals clear .TMs normal. Hearing is grossly normal bilaterally. Nose: External nasal exam reveals no deformity or inflammation. Nasal mucosa are pink and moist. No lesions or exudates noted.   Mouth: Oral mucosa and oropharynx reveal no lesions or exudates. Teeth in good repair. Neck: No deformities, masses, or tenderness noted. Range of motion decreased. Thyroid normal. Lungs: Normal respiratory effort; chest expands symmetrically. Lungs are clear to auscultation without rales, wheezes, or increased work of breathing. Heart: Normal rate and rhythm. Normal S1 and S2. No gallop, click, or rub. No murmur. Abdomen: Bowel sounds normal; abdomen soft and nontender. No masses, organomegaly or hernias noted.Aorta palpable ; no AAA Genitalia: as per Gyn                                   Musculoskeletal/extremities: No deformity or scoliosis noted of  the thoracic or lumbar spine.  No clubbing, cyanosis, edema, or significant extremity  deformity noted. Range of motion normal .Tone & strength normal. Hand joints normal  Fingernail  health good. Able to lie down & sit up w/o help. Negative SLR bilaterally Vascular: Carotid, radial artery, dorsalis pedis and  posterior tibial pulses are full and equal. No bruits present. Neurologic: Alert and oriented x3. Deep tendon reflexes symmetrical and normal.  Gait normal Skin: Intact without suspicious lesions or rashes. Lymph: No cervical, axillary lymphadenopathy present. Psych: Mood and affect are normal. Normally interactive                                                                                       Assessment & Plan:   #1 comprehensive physical exam; no acute findings  Plan: see Orders  & Recommendations

## 2014-07-20 NOTE — Patient Instructions (Signed)
Your next office appointment will be determined based upon review of your pending labs . Those instructions will be transmitted to you through My Chart  To prevent sleep dysfunction follow these instructions for sleep hygiene. Do not read, watch TV, or eat in bed. Do not get into bed until you are ready to turn off the light &  to go to sleep. Do not ingest stimulants ( decongestants, diet pills, nicotine, caffeine) after the evening meal.Do not take daytime naps.Cardiovascular exercise, this can be as simple a program as walking, is recommended 30-45 minutes 3-4 times per week. Take the EKG to any emergency room or preop visits. There are nonspecific changes; as long as there is no new change these are not clinically significant . If the old EKG is not available for comparison; it may result in unnecessary hospitalization for observation with significant unnecessary expense.

## 2014-07-20 NOTE — Progress Notes (Signed)
Pre visit review using our clinic review tool, if applicable. No additional management support is needed unless otherwise documented below in the visit note. 

## 2014-07-23 ENCOUNTER — Encounter: Payer: Self-pay | Admitting: Internal Medicine

## 2014-07-24 LAB — NMR LIPOPROFILE WITH LIPIDS
Cholesterol, Total: 206 mg/dL — ABNORMAL HIGH (ref 100–199)
HDL Particle Number: 37.2 umol/L (ref >=30.5)
HDL SIZE: 9.2 nm (ref >=9.2)
HDL-C: 69 mg/dL (ref >39)
LDL CALC: 123 mg/dL — AB (ref 0–99)
LDL Particle Number: 1487 nmol/L — ABNORMAL HIGH (ref <1000)
LDL Size: 21.5 nm (ref >=20.8)
LP-IR Score: <25 (ref <=45)
Large HDL-P: 9.4 umol/L (ref >=4.8)
Large VLDL-P: <0.8 nmol/L (ref <=2.7)
Small LDL Particle Number: 363 nmol/L (ref <=527)
Triglycerides: 71 mg/dL (ref 0–149)
VLDL Size: 42.4 nm (ref <=46.6)

## 2014-09-04 ENCOUNTER — Encounter: Payer: BC Managed Care – PPO | Admitting: Physician Assistant

## 2014-09-14 ENCOUNTER — Encounter: Payer: Self-pay | Admitting: Internal Medicine

## 2015-05-07 ENCOUNTER — Ambulatory Visit (INDEPENDENT_AMBULATORY_CARE_PROVIDER_SITE_OTHER): Payer: BLUE CROSS/BLUE SHIELD

## 2015-05-07 ENCOUNTER — Ambulatory Visit (INDEPENDENT_AMBULATORY_CARE_PROVIDER_SITE_OTHER): Payer: BLUE CROSS/BLUE SHIELD | Admitting: Podiatry

## 2015-05-07 VITALS — BP 119/69 | HR 59 | Resp 16

## 2015-05-07 DIAGNOSIS — M79672 Pain in left foot: Secondary | ICD-10-CM | POA: Diagnosis not present

## 2015-05-07 DIAGNOSIS — S92912A Unspecified fracture of left toe(s), initial encounter for closed fracture: Secondary | ICD-10-CM | POA: Diagnosis not present

## 2015-05-07 NOTE — Progress Notes (Signed)
   Subjective:    Patient ID: Brittany Myers, female    DOB: [birth year], 64 y.o.   MRN: [id number]  HPI Patient presents with foot pain in their Left foot, pinky toe. Pt stated, "rammed foot into sofa, throbs at night and can't pull sheet over foot at night" happened 3 weeks.  Pt has iced foot with some relief.   Review of Systems  All other systems reviewed and are negative.      Objective:   Physical Exam        Assessment & Plan:

## 2015-05-08 NOTE — Progress Notes (Signed)
Subjective:     Patient ID: Brittany Myers, female   DOB: Feb 23, 1951, 64 y.o.   MRN: [id number]  HPI patient presents with painful left toe that she traumatized about 3 weeks ago when she was concerned that it's not fully healing   Review of Systems  All other systems reviewed and are negative.      Objective:   Physical Exam  Constitutional: She is oriented to person, place, and time.  Cardiovascular: Intact distal pulses.   Musculoskeletal: Normal range of motion.  Neurological: She is oriented to person, place, and time.  Skin: Skin is warm.  Nursing note and vitals reviewed.  neurovascular status intact muscle strength adequate range of motion within normal limits with patient noted to have an edematous left fifth toe that's painful when pressed and has fluid buildup at the interphalangeal joint. No indication of dislocation injury     Assessment:     Traumatized left fifth toe was swelling and pain    Plan:     X-rays taken revealing possibility for small fracture but nothing through the joint or nothing displaced. Advised her that this will get better but we'll probably take another 6 months to one year

## 2015-07-24 ENCOUNTER — Encounter: Payer: Self-pay | Admitting: Internal Medicine

## 2015-07-24 ENCOUNTER — Ambulatory Visit (INDEPENDENT_AMBULATORY_CARE_PROVIDER_SITE_OTHER): Payer: BLUE CROSS/BLUE SHIELD | Admitting: Internal Medicine

## 2015-07-24 ENCOUNTER — Other Ambulatory Visit (INDEPENDENT_AMBULATORY_CARE_PROVIDER_SITE_OTHER): Payer: BLUE CROSS/BLUE SHIELD

## 2015-07-24 VITALS — BP 118/70 | HR 71 | Temp 98.0°F | Wt 126.1 lb

## 2015-07-24 DIAGNOSIS — Z0189 Encounter for other specified special examinations: Secondary | ICD-10-CM | POA: Diagnosis not present

## 2015-07-24 DIAGNOSIS — L280 Lichen simplex chronicus: Secondary | ICD-10-CM | POA: Insufficient documentation

## 2015-07-24 DIAGNOSIS — Z23 Encounter for immunization: Secondary | ICD-10-CM | POA: Diagnosis not present

## 2015-07-24 DIAGNOSIS — H8101 Meniere's disease, right ear: Secondary | ICD-10-CM | POA: Insufficient documentation

## 2015-07-24 DIAGNOSIS — Z0000 Encounter for general adult medical examination without abnormal findings: Secondary | ICD-10-CM

## 2015-07-24 LAB — HEPATIC FUNCTION PANEL
ALK PHOS: 65 U/L (ref 39–117)
ALT: 20 U/L (ref 0–35)
AST: 22 U/L (ref 0–37)
Albumin: 4.3 g/dL (ref 3.5–5.2)
BILIRUBIN TOTAL: 0.8 mg/dL (ref 0.2–1.2)
Bilirubin, Direct: 0.1 mg/dL (ref 0.0–0.3)
Total Protein: 7 g/dL (ref 6.0–8.3)

## 2015-07-24 LAB — CBC WITH DIFFERENTIAL/PLATELET
BASOS ABS: 0 10*3/uL (ref 0.0–0.1)
Basophils Relative: 0.3 % (ref 0.0–3.0)
Eosinophils Absolute: 0.1 10*3/uL (ref 0.0–0.7)
Eosinophils Relative: 1.2 % (ref 0.0–5.0)
HCT: 46.2 % — ABNORMAL HIGH (ref 36.0–46.0)
HEMOGLOBIN: 15.8 g/dL — AB (ref 12.0–15.0)
LYMPHS ABS: 1.6 10*3/uL (ref 0.7–4.0)
Lymphocytes Relative: 27.3 % (ref 12.0–46.0)
MCHC: 34.1 g/dL (ref 30.0–36.0)
MCV: 92.2 fl (ref 78.0–100.0)
MONO ABS: 0.5 10*3/uL (ref 0.1–1.0)
MONOS PCT: 8.8 % (ref 3.0–12.0)
NEUTROS PCT: 62.4 % (ref 43.0–77.0)
Neutro Abs: 3.6 10*3/uL (ref 1.4–7.7)
Platelets: 261 10*3/uL (ref 150.0–400.0)
RBC: 5.01 Mil/uL (ref 3.87–5.11)
RDW: 12.7 % (ref 11.5–15.5)
WBC: 5.8 10*3/uL (ref 4.0–10.5)

## 2015-07-24 LAB — LIPID PANEL
CHOLESTEROL: 205 mg/dL — AB (ref 0–200)
HDL: 64.9 mg/dL (ref >39.00)
LDL Cholesterol: 124 mg/dL — ABNORMAL HIGH (ref 0–99)
NONHDL: 140.48
TRIGLYCERIDES: 81 mg/dL (ref 0.0–149.0)
Total CHOL/HDL Ratio: 3
VLDL: 16.2 mg/dL (ref 0.0–40.0)

## 2015-07-24 LAB — BASIC METABOLIC PANEL
BUN: 19 mg/dL (ref 6–23)
CALCIUM: 9.7 mg/dL (ref 8.4–10.5)
CO2: 29 mEq/L (ref 19–32)
Chloride: 102 mEq/L (ref 96–112)
Creatinine, Ser: 0.78 mg/dL (ref 0.40–1.20)
GFR: 78.95 mL/min (ref >60.00)
GLUCOSE: 103 mg/dL — AB (ref 70–99)
POTASSIUM: 3.7 meq/L (ref 3.5–5.1)
SODIUM: 140 meq/L (ref 135–145)

## 2015-07-24 LAB — HEPATITIS C ANTIBODY: HCV Ab: NEGATIVE

## 2015-07-24 LAB — TSH: TSH: 2.14 u[IU]/mL (ref 0.35–4.50)

## 2015-07-24 MED ORDER — EPINEPHRINE 0.3 MG/0.3ML IJ SOAJ: 0.3000 mg | Freq: Once | INTRAMUSCULAR | Status: DC

## 2015-07-24 MED ORDER — LORAZEPAM 0.5 MG PO TABS: 0.5000 mg | ORAL_TABLET | Freq: Three times a day (TID) | ORAL | Status: DC | PRN

## 2015-07-24 NOTE — Patient Instructions (Signed)
  Your next office appointment will be determined based upon review of your pending labs. Those written interpretation of the lab results and instructions will be transmitted to you by My Chart  Critical results will be called.   Followup as needed for any active or acute issue. Please report any significant change in your symptoms. 

## 2015-07-24 NOTE — Progress Notes (Signed)
   Subjective:    Patient ID: Brittany Myers, female    DOB: 1951-03-20, 64 y.o.   MRN: [id number]  HPI The patient is here for physical to assess status of active health conditions.  PMH, FH, & Social History reviewed & updated.No change in [place] as recorded.  She has been compliant with her medications. She has no adverse effects. She feels that she is unable to drink red wine; however, while on the triamterene HCTZ prescribed by Dr Thornell Mule, ENT for hydrops of the right ear.   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  She is on a heart healthy diet. She works out @  the gym 3 times a week. 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 She has no associated cardio pulmonary symptoms  Her colonoscopy was negative in 2013; she has repeat in 2023. She has no active GI symptoms.  Her only positive review of systems are those related to the skin condition and the otic hydrops. She also has some tinnitus on the right.  Review of Systems  Chest pain, palpitations, tachycardia, exertional dyspnea, paroxysmal nocturnal dyspnea, claudication or edema are absent. No unexplained weight loss, abdominal pain, significant dyspepsia, dysphagia, melena, rectal bleeding, or persistently small caliber stools. Dysuria, pyuria, hematuria, frequency, nocturia or polyuria are denied. Change in hair or nails denied. No bowel changes of constipation or diarrhea. No intolerance to heat or cold.     Objective:   Physical Exam Pertinent or positive findings include: She has decreased hearing bilaterally. 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 Aorta is palpable without aneurysm formation.  General appearance :adequately nourished; in no distress.  Eyes: No conjunctival inflammation or scleral icterus is present.  Oral exam:  Lips and gums are healthy appearing.There is no oropharyngeal erythema or exudate noted. Dental hygiene is good.  Heart:  Normal rate and  regular rhythm. S1 and S2 normal without gallop, murmur, click, rub or other extra sounds    Lungs:Chest clear to auscultation; no wheezes, rhonchi,rales ,or rubs present.No increased work of breathing.   Abdomen: bowel sounds normal, soft and non-tender without masses, organomegaly or hernias noted.  No guarding or rebound.   Vascular : all pulses equal ; no bruits present.  Skin:Warm & dry.  Intact without suspicious lesions or rashes ; no tenting or jaundice   Lymphatic: No lymphadenopathy is noted about the head, neck, axilla.   Neuro: Strength, tone & DTRs normal.     Assessment & Plan:  #1 comprehensive physical exam; no acute findings  Plan: see Orders  & Recommendations

## 2015-07-24 NOTE — Progress Notes (Signed)
Pre visit review using our clinic review tool, if applicable. No additional management support is needed unless otherwise documented below in the visit note. 

## 2015-07-31 ENCOUNTER — Other Ambulatory Visit (INDEPENDENT_AMBULATORY_CARE_PROVIDER_SITE_OTHER): Payer: BLUE CROSS/BLUE SHIELD

## 2015-07-31 DIAGNOSIS — R739 Hyperglycemia, unspecified: Secondary | ICD-10-CM

## 2015-07-31 LAB — HEMOGLOBIN A1C: Hgb A1c MFr Bld: 5.5 % (ref 4.6–6.5)

## 2015-09-07 LAB — HM MAMMOGRAPHY: HM MAMMO: NEGATIVE

## 2015-09-20 ENCOUNTER — Encounter: Payer: Self-pay | Admitting: Internal Medicine

## 2015-09-28 ENCOUNTER — Encounter: Payer: Self-pay | Admitting: Internal Medicine

## 2015-09-28 ENCOUNTER — Ambulatory Visit (INDEPENDENT_AMBULATORY_CARE_PROVIDER_SITE_OTHER): Payer: BLUE CROSS/BLUE SHIELD | Admitting: Internal Medicine

## 2015-09-28 VITALS — BP 100/68 | HR 71 | Temp 97.5°F | Ht 64.0 in | Wt 131.0 lb

## 2015-09-28 DIAGNOSIS — M545 Low back pain: Secondary | ICD-10-CM

## 2015-09-28 DIAGNOSIS — K2100 Gastro-esophageal reflux disease with esophagitis, without bleeding: Secondary | ICD-10-CM

## 2015-09-28 MED ORDER — RANITIDINE HCL 150 MG PO TABS: 150.0000 mg | ORAL_TABLET | Freq: Two times a day (BID) | ORAL | Status: DC

## 2015-09-28 NOTE — Progress Notes (Signed)
Pre visit review using our clinic review tool, if applicable. No additional management support is needed unless otherwise documented below in the visit note. 

## 2015-09-28 NOTE — Progress Notes (Signed)
   Subjective:    Patient ID: Brittany Myers, female    DOB: 07-16-1951, 64 y.o.   MRN: [id number]  HPI   She developed low back pain after wearing boots with custom orthotics while shopping over the Christmas holidays. She took a total of 2 doses of Aleve. The back pain has resolved without further intervention. She's had no associated neuromuscular symptoms  Since the resolution of the back symptoms; she has had severe reflux with pain in the epigastrium &  up to the jaw @ rest . It resolves when she lies flat and stretches out.  She did have dysphagia on one occasion over the holidays.  Previously she took Prilosec 20 mg but this caused headache. She was only able to  take half a pill.  Over the holiday she did have increased amounts of chocolate and tea. She has half a glass of wine 3 times a week.    Review of Systems Fever, chills, sweats, or unexplained weight loss not present. No significant headaches. Vertigo, near syncope or imbalance denied. There is no numbness, tingling, or weakness in extremities.   No loss of control of bladder or bowels. Radicular type pain absent. Unexplained weight loss, melena, rectal bleeding, or persistently small caliber stools are denied.    Objective:   Physical Exam Pertinent or positive findings include: Aorta is palpable without aneurysm. She has minor crepitus of the knees. Straight leg raising is negative to 90  General appearance :adequately nourished; in no distress.  Eyes: No conjunctival inflammation or scleral icterus is present.  Oral exam:  Lips and gums are healthy appearing.There is no oropharyngeal erythema or exudate noted. Dental hygiene is good.  Heart:  Normal rate and regular rhythm. S1 and S2 normal without gallop, murmur, click, rub or other extra sounds    Lungs:Chest clear to auscultation; no wheezes, rhonchi,rales ,or rubs present.No increased work of breathing.   Abdomen: bowel sounds normal, soft and  non-tender without masses, organomegaly or hernias noted.  No guarding or rebound. No flank tenderness to percussion.  Vascular : all pulses equal ; no bruits present.  Skin:Warm & dry.  Intact without suspicious lesions or rashes ; no tenting or jaundice   Lymphatic: No lymphadenopathy is noted about the head, neck, axilla.   Neuro: Strength, tone & DTRs normal.     Assessment & Plan:  #1 low back pain, resolved  #2 symptomatic reflux with dysphagia X 1 and symptoms suggesting esophagitis  See orders and recommendations

## 2015-09-28 NOTE — Patient Instructions (Signed)
The best exercises for the low back include freestyle swimming, stretch aerobics, and yoga.Cybex & Nautilus machines rather than dead weights are better for the back.  Reflux of gastric acid may be asymptomatic as this may occur mainly during sleep.The triggers for reflux  include stress; the "aspirin family" ; alcohol; peppermint; and caffeine (coffee, tea, cola, and chocolate). The aspirin family would include aspirin and the nonsteroidal agents such as ibuprofen &  Naproxen. Tylenol would not cause reflux. If having symptoms ; food & drink should be avoided for @ least 2 hours before going to bed.

## 2015-11-22 ENCOUNTER — Ambulatory Visit (INDEPENDENT_AMBULATORY_CARE_PROVIDER_SITE_OTHER): Payer: BLUE CROSS/BLUE SHIELD | Admitting: Podiatry

## 2015-11-22 ENCOUNTER — Encounter: Payer: Self-pay | Admitting: Podiatry

## 2015-11-22 VITALS — BP 102/68 | HR 69 | Resp 16

## 2015-11-22 DIAGNOSIS — M722 Plantar fascial fibromatosis: Secondary | ICD-10-CM | POA: Diagnosis not present

## 2015-11-23 ENCOUNTER — Telehealth: Payer: Self-pay | Admitting: *Deleted

## 2015-11-23 NOTE — Progress Notes (Signed)
Subjective:     Patient ID: Brittany Myers, female   DOB: Feb 25, 1951, 65 y.o.   MRN: [id number]  HPI patient states that she has developed problems with the right leg and foot and she thinks she probably needs new orthotics which are 65 years old   Review of Systems     Objective:   Physical Exam Neurovascular status intact with patient having moderate flatfoot deformity and pain in the right plantar heel that's been present for a lot of years but has remained relatively quiet with orthotic treatment    Assessment:     Plantar fasciitis with probable tendinitis and possible hip and knee involvement which may be separate    Plan:     Reviewed condition and went ahead and scanned for new orthotics to continue to keep stress off the foot. Reappoint when returned

## 2015-11-23 NOTE — Telephone Encounter (Signed)
Patient called back to see what her insurance benefit coverage is for custom molded orthotics.  According to [facility] ref# [id number] with diagnosis M72.2 is a covered benefit under her plan covered at 80/20 if billed with an office visit and specialist co-pay of $30 no prior authorization is required.  Benefit coverage began 10/16/15 with a deductible of $2500 of which $0 has been met.  After discussing with patient she states that she would like to wait and to please not send the order she will call back in the future if she can afford.  Notified the billing department and charges were removed and scan and prescription will be held and patient calls back.

## 2016-02-13 ENCOUNTER — Ambulatory Visit (INDEPENDENT_AMBULATORY_CARE_PROVIDER_SITE_OTHER): Payer: BLUE CROSS/BLUE SHIELD | Admitting: Internal Medicine

## 2016-02-13 ENCOUNTER — Encounter: Payer: Self-pay | Admitting: Internal Medicine

## 2016-02-13 VITALS — BP 110/80 | HR 70 | Temp 97.6°F | Resp 16 | Ht 64.0 in | Wt 127.0 lb

## 2016-02-13 DIAGNOSIS — I10 Essential (primary) hypertension: Secondary | ICD-10-CM

## 2016-02-13 DIAGNOSIS — B029 Zoster without complications: Secondary | ICD-10-CM

## 2016-02-13 MED ORDER — EPINEPHRINE 0.3 MG/0.3ML IJ SOAJ: 0.3000 mg | INTRAMUSCULAR | Status: DC | PRN

## 2016-02-13 MED ORDER — VALACYCLOVIR HCL 1 G PO TABS: 1000.0000 mg | ORAL_TABLET | Freq: Two times a day (BID) | ORAL | Status: DC

## 2016-02-13 MED ORDER — OXYCODONE HCL 5 MG PO TABS: 5.0000 mg | ORAL_TABLET | ORAL | Status: DC | PRN

## 2016-02-13 NOTE — Progress Notes (Signed)
Pre visit review using our clinic review tool, if applicable. No additional management support is needed unless otherwise documented below in the visit note. 

## 2016-02-13 NOTE — Progress Notes (Signed)
Subjective:  Patient ID: Brittany Myers, female    DOB: 06-15-1951  Age: 65 y.o. MRN: [id number]  CC: Rash   HPI Brittany Myers presents for a rash on her left upper arm. She developed pain around her left shoulder about 3 days ago and now has a rash with blisters over her left upper arm. The rash causes itching and burning. She had a shingles infection at this same site several years ago.  History Brittany Myers has a past medical history of Bruit; Endometriosis; Increased homocysteine (Brittany Myers); Plantar fasciitis; and Osteopenia.   She has past surgical history that includes Laparoscopic endometriosis fulguration; Hemorrhoid surgery; Polypectomy (2001 ); and Colonoscopy (2013).   Her family history includes Alzheimer's disease in her maternal grandmother; Colon cancer in her paternal grandmother; Diabetes in her father; Endometriosis in her mother; Heart attack (age of onset: 20) in her maternal grandfather; Heart failure in her father; Hyperlipidemia in her brother and mother; Liver cancer in her maternal aunt; Osteopenia in her mother; Stroke (age of onset: 47) in her father.She reports that she has never smoked. She does not have any smokeless tobacco history on file. She reports that she drinks alcohol. She reports that she does not use illicit drugs.  Outpatient Prescriptions Prior to Visit  Medication Sig Dispense Refill  . clobetasol cream (TEMOVATE) [id number] % Apply 1 application topically as needed.    Marland Kitchen estradiol (ESTRACE) 0.1 MG/GM vaginal cream Place 1 Applicatorful vaginally at bedtime.    Marland Kitchen LORazepam (ATIVAN) 0.5 MG tablet Take 1 tablet (0.5 mg total) by mouth every 8 (eight) hours as needed for anxiety. 30 tablet 1  . ranitidine (ZANTAC) 150 MG tablet Take 1 tablet (150 mg total) by mouth 2 (two) times daily. (Patient taking differently: Take 150 mg by mouth as needed. ) 60 tablet 2  . triamterene-hydrochlorothiazide (DYAZIDE) 37.5-25 MG capsule Take 1 capsule by mouth daily.    Marland Kitchen  EPINEPHrine 0.3 mg/0.3 mL IJ SOAJ injection Inject 0.3 mLs (0.3 mg total) into the muscle once. (Patient taking differently: Inject 0.3 mg into the muscle as needed. ) 1 Device 2   No facility-administered medications prior to visit.    ROS Review of Systems  Constitutional: Negative for fever, chills, diaphoresis, appetite change and fatigue.  HENT: Negative.  Negative for sore throat.   Eyes: Negative.   Respiratory: Negative.  Negative for cough and shortness of breath.   Cardiovascular: Negative.   Gastrointestinal: Negative.  Negative for nausea, vomiting, abdominal pain and diarrhea.  Endocrine: Negative.   Genitourinary: Negative.   Musculoskeletal: Negative.  Negative for myalgias, back pain, arthralgias and neck pain.  Skin: Positive for rash. Negative for color change, pallor and wound.  Allergic/Immunologic: Negative.   Neurological: Negative.  Negative for headaches.  Hematological: Negative.  Negative for adenopathy. Does not bruise/bleed easily.  Psychiatric/Behavioral: Negative.     Objective:  BP 110/80 mmHg  Pulse 70  Temp(Src) 97.6 F (36.4 C) (Oral)  Ht 5\' 4"  (1.626 m)  Wt 127 lb (57.607 kg)  BMI 21.79 kg/m2  SpO2 97%  Physical Exam  Constitutional: No distress.  HENT:  Mouth/Throat: Oropharynx is clear and moist. No oropharyngeal exudate.  Eyes: Conjunctivae are normal. Right eye exhibits no discharge. Left eye exhibits no discharge. No scleral icterus.  Neck: Normal range of motion. Neck supple. No JVD present. No tracheal deviation present. No thyromegaly present.  Cardiovascular: Normal rate, regular rhythm, normal heart sounds and intact distal pulses.  Exam reveals no gallop  and no friction rub.   No murmur heard. Pulmonary/Chest: Effort normal and breath sounds normal. No stridor. No respiratory distress. She has no wheezes. She has no rales. She exhibits no tenderness.  Abdominal: Soft. Bowel sounds are normal. She exhibits no distension and no  mass. There is no tenderness. There is no rebound and no guarding.  Musculoskeletal: Normal range of motion. She exhibits no edema or tenderness.       Arms: Lymphadenopathy:    She has no cervical adenopathy.  Skin: Skin is warm and dry. Rash noted. She is not diaphoretic. No erythema. No pallor.  Vitals reviewed.   Lab Results  Component Value Date   WBC 5.8 07/24/2015   HGB 15.8* 07/24/2015   HCT 46.2* 07/24/2015   PLT 261.0 07/24/2015   GLUCOSE 103* 07/24/2015   CHOL 205* 07/24/2015   TRIG 81.0 07/24/2015   HDL 64.90 07/24/2015   LDLDIRECT 116.2 05/20/2012   LDLCALC 124* 07/24/2015   ALT 20 07/24/2015   AST 22 07/24/2015   NA 140 07/24/2015   K 3.7 07/24/2015   CL 102 07/24/2015   CREATININE 0.78 07/24/2015   BUN 19 07/24/2015   CO2 29 07/24/2015   TSH 2.14 07/24/2015   HGBA1C 5.5 07/31/2015    Assessment & Plan:   Brittany Myers was seen today for rash.  Diagnoses and all orders for this visit:  Essential hypertension- her blood pressures well controlled, I will monitor her electrolytes and renal function. -     CBC with Differential/Platelet; Future -     Basic metabolic panel; Future  Shingles rash- I will treat the infection with Valtrex and control the pain with oxycodone, I've asked her to have labs done today to document that her renal function is adequate enough to handle valacyclovir and it to monitor her hematological system to screen for lymphoproliferative disease. -     CBC with Differential/Platelet; Future -     Basic metabolic panel; Future -     valACYclovir (VALTREX) 1000 MG tablet; Take 1 tablet (1,000 mg total) by mouth 2 (two) times daily. -     oxyCODONE (OXY IR/ROXICODONE) 5 MG immediate release tablet; Take 1 tablet (5 mg total) by mouth every 4 (four) hours as needed for severe pain.  Other orders -     EPINEPHrine 0.3 mg/0.3 mL IJ SOAJ injection; Inject 0.3 mLs (0.3 mg total) into the muscle as needed.  I have changed [name]'s EPINEPHrine.  I am also having her start on valACYclovir and oxyCODONE. Additionally, I am having her maintain her estradiol, LORazepam, triamterene-hydrochlorothiazide, ranitidine, and clobetasol cream.  Meds ordered this encounter  Medications  . EPINEPHrine 0.3 mg/0.3 mL IJ SOAJ injection    Sig: Inject 0.3 mLs (0.3 mg total) into the muscle as needed.    Dispense:  2 Device    Refill:  2  . valACYclovir (VALTREX) 1000 MG tablet    Sig: Take 1 tablet (1,000 mg total) by mouth 2 (two) times daily.    Dispense:  21 tablet    Refill:  2  . oxyCODONE (OXY IR/ROXICODONE) 5 MG immediate release tablet    Sig: Take 1 tablet (5 mg total) by mouth every 4 (four) hours as needed for severe pain.    Dispense:  30 tablet    Refill:  0     Follow-up: Return in about 4 weeks (around 03/12/2016).  Scarlette Calico, MD

## 2016-02-13 NOTE — Patient Instructions (Signed)

## 2016-02-18 ENCOUNTER — Encounter: Payer: Self-pay | Admitting: Podiatry

## 2016-02-18 ENCOUNTER — Ambulatory Visit (INDEPENDENT_AMBULATORY_CARE_PROVIDER_SITE_OTHER): Payer: BLUE CROSS/BLUE SHIELD | Admitting: Podiatry

## 2016-02-18 VITALS — BP 105/63 | HR 71 | Resp 12

## 2016-02-18 DIAGNOSIS — L600 Ingrowing nail: Secondary | ICD-10-CM | POA: Diagnosis not present

## 2016-02-18 NOTE — Progress Notes (Signed)
   Subjective:    Patient ID: Brittany Myers, female    DOB: 20-Mar-1951, 65 y.o.   MRN: [id number]  HPI "I have a toenail half on and half off.  I have Shingles too.  This toe has had a fungus under it.  It's a thick toenail.  I had them to put a little bit of acrylic nail on the tip of it so I could polish it.  My husband hit it with foot.  I been using Hydrogen Peroxide on it and putting a bandaid on it at night.  It is so painful.  I been wearing flip flops, can't wear a dress shoe."    Review of Systems     Objective:   Physical Exam        Assessment & Plan:

## 2016-02-18 NOTE — Patient Instructions (Signed)

## 2016-02-19 ENCOUNTER — Encounter: Payer: Self-pay | Admitting: Internal Medicine

## 2016-02-20 NOTE — Progress Notes (Signed)
Subjective:     Patient ID: Brittany Myers, female   DOB: 05-Mar-1951, 65 y.o.   MRN: [id number]  HPI patient presents with a damaged left hallux nail that's been chronic in nature and has occurred several times over the last few years with pain   Review of Systems  All other systems reviewed and are negative.      Objective:   Physical Exam  Constitutional: She is oriented to person, place, and time.  Cardiovascular: Intact distal pulses.   Musculoskeletal: Normal range of motion.  Neurological: She is oriented to person, place, and time.  Skin: Skin is warm.  Nursing note and vitals reviewed.  neurovascular status intact muscle strength adequate range of motion within normal limits with patient found to have a thickened damaged left hallux nail that's loose and painful when pressed with no drainage noted at the current time. Patient's found to have good digital perfusion and is well oriented 3     Assessment:     Damage left hallux nail secondary to previous trauma with chronic nature to injury    Plan:     H&P and x-ray and condition reviewed with patient. At this point I recommended nail removal we will allow a new nail to regrow and at one point it may require permanent procedure. Patient is scheduled probably not grow out normally and today I infiltrated the left hallux 60 mg I can Marcaine mixture remove the hallux nail cleaned out the bed and applied sterile dressing and instructed on soaks

## 2016-03-11 ENCOUNTER — Encounter: Payer: Self-pay | Admitting: Internal Medicine

## 2016-03-11 ENCOUNTER — Ambulatory Visit (INDEPENDENT_AMBULATORY_CARE_PROVIDER_SITE_OTHER): Payer: BLUE CROSS/BLUE SHIELD | Admitting: Internal Medicine

## 2016-03-11 VITALS — BP 112/84 | HR 60 | Temp 98.6°F | Resp 16 | Wt 129.0 lb

## 2016-03-11 DIAGNOSIS — G479 Sleep disorder, unspecified: Secondary | ICD-10-CM | POA: Diagnosis not present

## 2016-03-11 DIAGNOSIS — R0789 Other chest pain: Secondary | ICD-10-CM | POA: Insufficient documentation

## 2016-03-11 DIAGNOSIS — R9431 Abnormal electrocardiogram [ECG] [EKG]: Secondary | ICD-10-CM

## 2016-03-11 MED ORDER — LORAZEPAM 0.5 MG PO TABS: 0.5000 mg | ORAL_TABLET | Freq: Every day | ORAL | Status: DC | PRN

## 2016-03-11 NOTE — Progress Notes (Signed)
Subjective:    Patient ID: Brittany Myers, female    DOB: May 22, 1951, 65 y.o.   MRN: [id number]  HPI She is here to establish with a new pcp.    Shingles: She recently was diagnosed with shingles. Rash has completely resolved. It has occurred under a time of increased stress.  Cochlear hydrops right ear: She is taking the Dyazide for this. She has never had any issues with her blood pressure been diagnosed with hypertension. She does follow with ENT.  Abnormal EKG, family history of ischemic heart disease, recent chest pain: Prior to having been diagnosed with shingles she was experiencing chest pain. She believes the chest pain may have been related to the shingles, but is not sure. The chest pain was in the center of her chest. Her prior primary care physician advised her that her EKG was abnormal and that she should have a stress test done because of her family history of ischemic heart disease. She was not able to have it done previously because she was not in town for work. She is exercising and denies any symptoms with exercise.  Difficulty sleeping: She sometimes has difficulty falling asleep and staying asleep. She drinks one cup of coffee around noon and that sometimes can affect her sleep. If she has a couple of glasses of wine she knows that also affects her sleep and she does not do that often. Stress is likely affecting her sleep as well. She has been taking Ativan as needed, which works well. He does concern her to be on that type of medication and she has never taken on a daily basis. She sometimes takes Benadryl or melatonin, but both of those give her a medicine head in the morning. She would prefer not to take anything, but knows she needs to sleep as well.  Medications and allergies reviewed with patient and updated if appropriate.  Patient Active Problem List   Diagnosis Date Noted  . Shingles rash 02/13/2016  . 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 Family history of ischemic heart disease 05/20/2011  . Osteopenia 05/20/2011  . 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 TINNITUS, RIGHT 12/20/2009  . COLONIC POLYPS, BENIGN 02/11/2008  . MENOPAUSAL SYNDROME 02/11/2008    Current Outpatient Prescriptions on File Prior to Visit  Medication Sig Dispense Refill  . clobetasol cream (TEMOVATE) [id number] % Apply 1 application topically as needed.    Marland Kitchen EPINEPHrine 0.3 mg/0.3 mL IJ SOAJ injection Inject 0.3 mLs (0.3 mg total) into the muscle as needed. 2 Device 2  . estradiol (ESTRACE) 0.1 MG/GM vaginal cream Place 1 Applicatorful vaginally 2 (two) times a week.     Marland Kitchen LORazepam (ATIVAN) 0.5 MG tablet Take 1 tablet (0.5 mg total) by mouth every 8 (eight) hours as needed for anxiety. 30 tablet 1  . triamterene-hydrochlorothiazide (DYAZIDE) 37.5-25 MG capsule Take 1 capsule by mouth daily.     No current facility-administered medications on file prior to visit.    Past Medical History  Diagnosis Date  . 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 Endometriosis   . Increased homocysteine (HCC)     high-normal  . Plantar fasciitis   . Osteopenia     T score ; -2.1  @ L femoral neck in 2006    Past Surgical History  Procedure Laterality Date  . Laparoscopic endometriosis fulguration      5 surgeries; Dr  Dickstein, Gyn  . Hemorrhoid surgery    . Polypectomy  2001     Dr.Sam Jena   . Colonoscopy  2013    Dr Sharlett Iles    Social History   Social History  . Marital Status: Married    Spouse Name: N/A  . Number of Children: N/A  . Years of Education: N/A   Occupational History  . General Contractor/Interior Designer    Social History Main Topics  . Smoking status: Never Smoker   . Smokeless tobacco: None  . Alcohol Use: 0.0 oz/week     Comment:   3/ week  . Drug Use: No  . Sexual Activity: Not Asked   Other Topics Concern  . None   Social History Narrative   Heart healthy diet   Regular  Exercise-no    Family History  Problem Relation Age of Onset  . Endometriosis Mother   . Hyperlipidemia Mother   . Osteopenia Mother   . Hyperlipidemia Brother   . Heart attack Maternal Grandfather 65  . Liver cancer Maternal Aunt   . Colon cancer Paternal Grandmother   . Alzheimer's disease Maternal Grandmother   . Heart failure Father     heart attack @ 59 after CVA  . Stroke Father 17    hemorrhagic  after MI  . Diabetes Father     Review of Systems  Constitutional: Negative for fever.  Respiratory: Negative for cough, shortness of breath and wheezing.   Cardiovascular: Positive for chest pain (atypical chest pain  ? shingles). Negative for palpitations and leg swelling.  Gastrointestinal: Negative for abdominal pain.  Neurological: Positive for headaches. Negative for dizziness and light-headedness.  Psychiatric/Behavioral: Positive for sleep disturbance.       Objective:   Filed Vitals:   03/11/16 1039  BP: 112/84  Pulse: 60  Temp: 98.6 F (37 C)  Resp: 16   Filed Weights   03/11/16 1039  Weight: 129 lb (58.514 kg)   Body mass index is 22.13 kg/(m^2).   Physical Exam  Constitutional: She is oriented to person, place, and time. She appears well-developed and well-nourished. No distress.  HENT:  Head: Normocephalic and atraumatic.  Eyes: Conjunctivae are normal.  Neck: Neck supple. No tracheal deviation present. No thyromegaly present.  Cardiovascular: Normal rate, regular rhythm, normal heart sounds and intact distal pulses.   No murmur heard. Pulmonary/Chest: Effort normal and breath sounds normal. No respiratory distress. She has no wheezes. She has no rales.  Musculoskeletal: She exhibits no edema.  Neurological: She is alert and oriented to person, place, and time.  Skin: Skin is warm and dry. No rash noted. She is not diaphoretic.  Psychiatric: She has a normal mood and affect. Her behavior is normal. Thought content normal.            Assessment & Plan:  See Problem List for Assessment and Plan of chronic medical problems. Follow-up in the fall for a physical exam

## 2016-03-11 NOTE — Assessment & Plan Note (Addendum)
Nonspecific T-wave changes Was experiencing some recent left-sided chest pain Stress test ordered

## 2016-03-11 NOTE — Patient Instructions (Addendum)
   All other Health Maintenance issues reviewed.   All recommended immunizations and age-appropriate screenings are up-to-date or discussed.  No immunizations administered today.   Medications reviewed and updated.  No changes recommended at this time.   A stress test was ordered

## 2016-03-11 NOTE — Assessment & Plan Note (Signed)
She recently experienced some atypical left-sided chest pain-likely related to shingles, but given her abnormal EKG and family history of ischemic heart disease we will go ahead and get a stress test

## 2016-03-11 NOTE — Assessment & Plan Note (Addendum)
Uses ativan as needed if she has not slept in a few days Sometimes takes bendaryl and melatonin She can continue Ativan as long she does not take it on a daily basis and tries to avoid Discussed trying trazodone as well

## 2016-03-11 NOTE — Progress Notes (Signed)
Pre visit review using our clinic review tool, if applicable. No additional management support is needed unless otherwise documented below in the visit note. 

## 2016-03-26 ENCOUNTER — Ambulatory Visit (INDEPENDENT_AMBULATORY_CARE_PROVIDER_SITE_OTHER): Payer: BLUE CROSS/BLUE SHIELD | Admitting: Geriatric Medicine

## 2016-03-26 ENCOUNTER — Ambulatory Visit: Payer: BLUE CROSS/BLUE SHIELD

## 2016-03-26 DIAGNOSIS — Z299 Encounter for prophylactic measures, unspecified: Secondary | ICD-10-CM

## 2016-03-26 DIAGNOSIS — Z418 Encounter for other procedures for purposes other than remedying health state: Secondary | ICD-10-CM | POA: Diagnosis not present

## 2016-03-26 DIAGNOSIS — Z23 Encounter for immunization: Secondary | ICD-10-CM | POA: Diagnosis not present

## 2016-03-28 ENCOUNTER — Telehealth: Payer: Self-pay | Admitting: Internal Medicine

## 2016-03-28 NOTE — Telephone Encounter (Signed)
Patient Name: Brittany Myers DOB: 1951/03/18 Initial Comment Caller states she had the Shingles Vaccination on Tuesday. Fingers are numb. Arm pain. Neck feels funny. Nurse Assessment Nurse: Vallery Sa, RN, [place] Date/Time (Eastern Time): 03/28/2016 9:54:33 AM Confirm and document reason for call. If symptomatic, describe symptoms. You must click the next button to save text entered. ---Caller states she developed soreness in her neck (rated as a 3 on the 1 to 10 scale), discomfort on her right elbow and numbness in her right fingers. No known fever. No severe breathing or swallowing difficulty. Alert and responsive. Has the patient traveled out of the country within the last 30 days? ---No Does the patient have any new or worsening symptoms? ---Yes Will a triage be completed? ---Yes Related visit to physician within the last 2 weeks? ---Yes Does the PT have any chronic conditions? (i.e. diabetes, asthma, etc.) ---Yes List chronic conditions. ---Shingles vaccination 3 days ago, Vaginitis, she had Shingles on her upper left arm May 2017 Is this a behavioral health or substance abuse call? ---No Guidelines Guideline Title Affirmed Question Affirmed Notes Neck Pain or Stiffness Numbness in an arm or hand (i.e., loss of sensation) Immunization Reactions [1] Redness or red streak around the injection site AND [2] begins > 48 hours after shot AND [3] no fever (Exception: red area < 1 inch or 2.5 cm wide) Final Disposition User See Physician within [facility], RN, [place] states they are headed out of town and she will have to check with her husband regarding where she will be seen she will call back as needed. She is aware of appointment options with [facility]. Referrals GO TO FACILITY OTHER - SPECIFY Disagree/Comply: Comply Disagree/Comply: Comply

## 2016-04-21 ENCOUNTER — Other Ambulatory Visit (HOSPITAL_COMMUNITY): Payer: BLUE CROSS/BLUE SHIELD

## 2016-05-19 ENCOUNTER — Other Ambulatory Visit (HOSPITAL_COMMUNITY): Payer: BLUE CROSS/BLUE SHIELD

## 2016-05-22 ENCOUNTER — Telehealth (HOSPITAL_COMMUNITY): Payer: Self-pay | Admitting: *Deleted

## 2016-05-22 NOTE — Telephone Encounter (Signed)
Patient given detailed instructions per Stress Test Requisition Sheet for test on 05/26/16 at 7:30.Patient Notified to arrive 30 minutes early, and that it is imperative to arrive on time for appointment to keep from having the test rescheduled.  Patient verbalized understanding. Brittany Myers

## 2016-05-26 ENCOUNTER — Ambulatory Visit (HOSPITAL_BASED_OUTPATIENT_CLINIC_OR_DEPARTMENT_OTHER): Payer: Medicare Other

## 2016-05-26 ENCOUNTER — Ambulatory Visit (HOSPITAL_COMMUNITY): Payer: Medicare Other | Attending: Internal Medicine

## 2016-05-26 DIAGNOSIS — Z8249 Family history of ischemic heart disease and other diseases of the circulatory system: Secondary | ICD-10-CM | POA: Diagnosis not present

## 2016-05-26 DIAGNOSIS — R0789 Other chest pain: Secondary | ICD-10-CM

## 2016-05-26 DIAGNOSIS — R9431 Abnormal electrocardiogram [ECG] [EKG]: Secondary | ICD-10-CM | POA: Diagnosis not present

## 2016-05-26 DIAGNOSIS — R000 Tachycardia, unspecified: Secondary | ICD-10-CM | POA: Insufficient documentation

## 2016-05-26 DIAGNOSIS — H903 Sensorineural hearing loss, bilateral: Secondary | ICD-10-CM | POA: Diagnosis not present

## 2016-05-26 DIAGNOSIS — R0989 Other specified symptoms and signs involving the circulatory and respiratory systems: Secondary | ICD-10-CM

## 2016-05-26 DIAGNOSIS — R079 Chest pain, unspecified: Secondary | ICD-10-CM | POA: Diagnosis present

## 2016-05-26 DIAGNOSIS — H93231 Hyperacusis, right ear: Secondary | ICD-10-CM | POA: Diagnosis not present

## 2016-05-26 HISTORY — PX: OTHER SURGICAL HISTORY: SHX169

## 2016-05-27 DIAGNOSIS — L858 Other specified epidermal thickening: Secondary | ICD-10-CM | POA: Diagnosis not present

## 2016-05-27 DIAGNOSIS — Z85828 Personal history of other malignant neoplasm of skin: Secondary | ICD-10-CM | POA: Diagnosis not present

## 2016-05-27 DIAGNOSIS — L820 Inflamed seborrheic keratosis: Secondary | ICD-10-CM | POA: Diagnosis not present

## 2016-05-27 DIAGNOSIS — L570 Actinic keratosis: Secondary | ICD-10-CM | POA: Diagnosis not present

## 2016-07-14 ENCOUNTER — Other Ambulatory Visit: Payer: Self-pay | Admitting: Internal Medicine

## 2016-07-14 NOTE — Telephone Encounter (Signed)
RX faxed to [facility] 

## 2016-07-28 ENCOUNTER — Ambulatory Visit (INDEPENDENT_AMBULATORY_CARE_PROVIDER_SITE_OTHER): Payer: Medicare Other | Admitting: Internal Medicine

## 2016-07-28 ENCOUNTER — Encounter: Payer: Self-pay | Admitting: Internal Medicine

## 2016-07-28 ENCOUNTER — Other Ambulatory Visit (INDEPENDENT_AMBULATORY_CARE_PROVIDER_SITE_OTHER): Payer: Medicare Other

## 2016-07-28 VITALS — BP 124/82 | HR 76 | Temp 97.9°F | Resp 16 | Ht 64.0 in | Wt 129.0 lb

## 2016-07-28 DIAGNOSIS — Z136 Encounter for screening for cardiovascular disorders: Secondary | ICD-10-CM

## 2016-07-28 DIAGNOSIS — Z0000 Encounter for general adult medical examination without abnormal findings: Secondary | ICD-10-CM | POA: Diagnosis not present

## 2016-07-28 DIAGNOSIS — K219 Gastro-esophageal reflux disease without esophagitis: Secondary | ICD-10-CM | POA: Diagnosis not present

## 2016-07-28 DIAGNOSIS — R739 Hyperglycemia, unspecified: Secondary | ICD-10-CM

## 2016-07-28 DIAGNOSIS — Z23 Encounter for immunization: Secondary | ICD-10-CM

## 2016-07-28 DIAGNOSIS — F419 Anxiety disorder, unspecified: Secondary | ICD-10-CM

## 2016-07-28 DIAGNOSIS — Z8249 Family history of ischemic heart disease and other diseases of the circulatory system: Secondary | ICD-10-CM | POA: Diagnosis not present

## 2016-07-28 DIAGNOSIS — Z1329 Encounter for screening for other suspected endocrine disorder: Secondary | ICD-10-CM

## 2016-07-28 DIAGNOSIS — M8580 Other specified disorders of bone density and structure, unspecified site: Secondary | ICD-10-CM

## 2016-07-28 DIAGNOSIS — H8101 Meniere's disease, right ear: Secondary | ICD-10-CM

## 2016-07-28 DIAGNOSIS — G479 Sleep disorder, unspecified: Secondary | ICD-10-CM

## 2016-07-28 LAB — CBC WITH DIFFERENTIAL/PLATELET
BASOS PCT: 0.6 % (ref 0.0–3.0)
Basophils Absolute: 0 10*3/uL (ref 0.0–0.1)
EOS PCT: 3.1 % (ref 0.0–5.0)
Eosinophils Absolute: 0.2 10*3/uL (ref 0.0–0.7)
HCT: 43.4 % (ref 36.0–46.0)
Hemoglobin: 14.9 g/dL (ref 12.0–15.0)
LYMPHS ABS: 1.7 10*3/uL (ref 0.7–4.0)
Lymphocytes Relative: 31 % (ref 12.0–46.0)
MCHC: 34.3 g/dL (ref 30.0–36.0)
MCV: 92.8 fl (ref 78.0–100.0)
MONO ABS: 0.4 10*3/uL (ref 0.1–1.0)
Monocytes Relative: 7.6 % (ref 3.0–12.0)
NEUTROS ABS: 3.1 10*3/uL (ref 1.4–7.7)
NEUTROS PCT: 57.7 % (ref 43.0–77.0)
PLATELETS: 221 10*3/uL (ref 150.0–400.0)
RBC: 4.68 Mil/uL (ref 3.87–5.11)
RDW: 12.3 % (ref 11.5–15.5)
WBC: 5.4 10*3/uL (ref 4.0–10.5)

## 2016-07-28 LAB — COMPREHENSIVE METABOLIC PANEL
ALT: 14 U/L (ref 0–35)
AST: 17 U/L (ref 0–37)
Albumin: 4.2 g/dL (ref 3.5–5.2)
Alkaline Phosphatase: 61 U/L (ref 39–117)
BUN: 15 mg/dL (ref 6–23)
CALCIUM: 9.2 mg/dL (ref 8.4–10.5)
CHLORIDE: 108 meq/L (ref 96–112)
CO2: 28 meq/L (ref 19–32)
Creatinine, Ser: 0.7 mg/dL (ref 0.40–1.20)
GFR: 89.17 mL/min (ref >60.00)
GLUCOSE: 92 mg/dL (ref 70–99)
Potassium: 4.1 mEq/L (ref 3.5–5.1)
Sodium: 142 mEq/L (ref 135–145)
Total Bilirubin: 0.6 mg/dL (ref 0.2–1.2)
Total Protein: 6.6 g/dL (ref 6.0–8.3)

## 2016-07-28 LAB — LIPID PANEL
CHOL/HDL RATIO: 3
CHOLESTEROL: 178 mg/dL (ref 0–200)
HDL: 63 mg/dL (ref >39.00)
LDL CALC: 102 mg/dL — AB (ref 0–99)
NonHDL: 115.39
TRIGLYCERIDES: 67 mg/dL (ref 0.0–149.0)
VLDL: 13.4 mg/dL (ref 0.0–40.0)

## 2016-07-28 LAB — TSH: TSH: 1.47 u[IU]/mL (ref 0.35–4.50)

## 2016-07-28 LAB — HEMOGLOBIN A1C: Hgb A1c MFr Bld: 5.3 % (ref 4.6–6.5)

## 2016-07-28 MED ORDER — RANITIDINE HCL 150 MG PO TABS: 150.0000 mg | ORAL_TABLET | Freq: Every day | ORAL | Status: DC

## 2016-07-28 NOTE — Assessment & Plan Note (Signed)
Will check A1c

## 2016-07-28 NOTE — Assessment & Plan Note (Addendum)
Increased anxiety/stress Takes Ativan only as needed for sleep-approximately once a month Exercises regularly May be the cause of some of her heartburn Deferred an SSRI at this time Check TSH and basic blood work to rule out other causes

## 2016-07-28 NOTE — Assessment & Plan Note (Signed)
Takes Ativan only as needed-approximately once a month Denies side effects Aware of concerns with possible addiction

## 2016-07-28 NOTE — Assessment & Plan Note (Signed)
Check lipid panel, TSH 

## 2016-07-28 NOTE — Assessment & Plan Note (Signed)
DEXA up-to-date Taking calcium and vitamin D Exercising regularly

## 2016-07-28 NOTE — Assessment & Plan Note (Signed)
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

## 2016-07-28 NOTE — Assessment & Plan Note (Signed)
Taking medication every other day-management per ENT She is concerned something else may be going on because there has not been much improvement over the past 14 months She will discuss this with ENT-she would ideally like to have an MRI done, which they have discussed

## 2016-07-28 NOTE — Progress Notes (Signed)
Pre visit review using our clinic review tool, if applicable. No additional management support is needed unless otherwise documented below in the visit note. 

## 2016-07-28 NOTE — Progress Notes (Signed)
Subjective:    Patient ID: Brittany Myers, female    DOB: 1951-01-06, 65 y.o.   MRN: [id number]  HPI Here for an initial medicare wellness exam and follow up visit of her chronic medical problems.   She gets silent reflux - she gets pressure in her epigastric area.  She has trouble swallowing at times.  She denies true heartburn. She does not have symptoms on a daily basis. She has been given a prescription for ranitidine in the past, but has not been taking it. She does have some increased stress. She typically avoids anti-inflammatories. She has not been eating as healthy over the last 6 months because her husband has some difficulty swallowing and they have been eating foods that he is able to swallow well.      I have personally reviewed and have noted 1.The patient's medical and social history 2.Their use of alcohol, tobacco or illicit drugs 3.Their current medications and supplements 4.The patient's functional ability including ADL's, fall risks, home safety risks and                 hearing or visual impairment. 5.Diet and physical activities 6.Evidence for depression or mood disorders 7.Care team reviewed  - ENT - Dr Thornell Mule, [facility] [place] - Dr Valentino Saxon, Payton Mccallum - Dr Ronnald Ramp   Are there smokers in your home (other than you)? No  Risk Factors Exercise: regular Dietary issues discussed: avoids fried foods, sweets - Mediterranean-based diet  Cardiac risk factors: advanced age  Depression Screen  Have you felt down, depressed or hopeless? No  Have you felt little interest or pleasure in doing things?  No  Activities of Daily Living In your present state of health, do you have any difficulty performing the following activities?:  Driving? No Managing money?  No Feeding yourself? No Getting from bed to chair? No Climbing a flight of stairs? No Preparing food and eating?: No Bathing or showering? No Getting dressed:  No Getting to/using the toilet? No Moving around from place to place: No In the past year have you fallen or had a near fall?: No   Are you sexually active?  yes  Do you have more than one partner?  No   Hearing Difficulties:  - yes right , intermittent in left ear-related to hydrops Do you often ask people to speak up or repeat themselves? yes Do you experience ringing or noises in your ears? Yes Do you have difficulty understanding soft or whispered voices? Yes Vision:              Any change in vision:  no             Up to date with eye exam: yes  Memory:  Do you feel that you have a problem with memory? No, except some recall, which she feels is probably normal  Do you often misplace items? No  Do you feel safe at home?  Yes  Cognitive Testing  Alert, Orientated? Yes  Normal Appearance? Yes  Recall of three objects?  Yes  Can perform simple calculations? Yes  Displays appropriate judgment? Yes  Can read the correct time from a watch face? Yes   Advanced Directives have been discussed with the patient? Yes   Medications and allergies reviewed with patient and updated if appropriate.  Patient Active Problem List   Diagnosis Date Noted  . Nonspecific abnormal electrocardiogram (ECG) (EKG) 03/11/2016  . Atypical chest pain 03/11/2016  . Shingles rash 02/13/2016  .  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 Family history of ischemic heart disease 05/20/2011  . Osteopenia 05/20/2011  . 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 TINNITUS, RIGHT 12/20/2009  . COLONIC POLYPS, BENIGN 02/11/2008  . MENOPAUSAL SYNDROME 02/11/2008    Current Outpatient Prescriptions on File Prior to Visit  Medication Sig Dispense Refill  . clobetasol cream (TEMOVATE) [id number] % Apply 1 application topically as needed.    Marland Kitchen EPINEPHrine 0.3 mg/0.3 mL IJ SOAJ injection Inject 0.3 mLs (0.3 mg total) into the muscle as needed. 2  Device 2  . estradiol (ESTRACE) 0.1 MG/GM vaginal cream Place 1 Applicatorful vaginally 2 (two) times a week.     Marland Kitchen LORazepam (ATIVAN) 0.5 MG tablet take 1 tablet by mouth every 8 hours if needed for anxiety 30 tablet 2  . triamterene-hydrochlorothiazide (DYAZIDE) 37.5-25 MG capsule Take 1 capsule by mouth daily.     No current facility-administered medications on file prior to visit.     Past Medical History:  Diagnosis Date  . 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 Endometriosis   . Increased homocysteine (HCC)    high-normal  . Osteopenia    T score ; -2.1  @ L femoral neck in 2006  . Plantar fasciitis     Past Surgical History:  Procedure Laterality Date  . COLONOSCOPY  2013   Dr Sharlett Iles  . HEMORRHOID SURGERY    . LAPAROSCOPIC ENDOMETRIOSIS FULGURATION     5 surgeries; Dr Irven [place], [facility] [place]  . POLYPECTOMY  2001    Dr.Sam Gahanna     Social History   Social History  . Marital status: Married    Spouse name: N/A  . Number of children: N/A  . Years of education: N/A   Occupational History  . General Contractor/Interior Designer    Social History Main Topics  . Smoking status: Never Smoker  . Smokeless tobacco: Not on file  . Alcohol use 0.0 oz/week     Comment:   3/ week  . Drug use: No  . Sexual activity: Not on file   Other Topics Concern  . Not on file   Social History Narrative   Heart healthy diet   Regular Exercise-no    Family History  Problem Relation Age of Onset  . Endometriosis Mother   . Hyperlipidemia Mother   . Osteopenia Mother   . Hyperlipidemia Brother   . Heart attack Maternal Grandfather 65  . Liver cancer Maternal Aunt   . Colon cancer Paternal Grandmother   . Alzheimer's disease Maternal Grandmother   . Heart failure Father     heart attack @ 41 after CVA  . Stroke Father 65    hemorrhagic  after MI  . Diabetes Father     Review of Systems  Constitutional: Negative for appetite change, chills, fatigue and fever.  HENT: Positive for  hearing loss and tinnitus.   Eyes: Negative for visual disturbance.  Respiratory: Negative for cough, shortness of breath and wheezing.   Cardiovascular: Negative for chest pain, palpitations and leg swelling.  Gastrointestinal: Positive for abdominal pain (pressure in epigastric area). Negative for blood in stool, constipation, diarrhea and nausea.       Gerd  Genitourinary: Negative for dysuria and hematuria.  Musculoskeletal: Negative for arthralgias and back pain.  Skin: Negative for color change and rash.  Neurological: Positive for headaches (season changes -dryness). Negative for dizziness and light-headedness.  Psychiatric/Behavioral: Negative for  dysphoric mood. The patient is nervous/anxious.        Objective:   Vitals:   07/28/16 0843  BP: 124/82  Pulse: 76  Resp: 16  Temp: 97.9 F (36.6 C)   Filed Weights   07/28/16 0843  Weight: 129 lb (58.5 kg)   Body mass index is 22.14 kg/m.   Physical Exam Constitutional: She appears well-developed and well-nourished. No distress.  HENT:  Head: Normocephalic and atraumatic.  Right Ear: External ear normal. Normal ear canal and TM Left Ear: External ear normal.  Normal ear canal and TM Mouth/Throat: Oropharynx is clear and moist.  Eyes: Conjunctivae and EOM are normal.  Neck: Neck supple. No tracheal deviation present. No thyromegaly present.  No carotid bruit  Cardiovascular: Normal rate, regular rhythm and normal heart sounds.   No murmur heard.  No edema. Pulmonary/Chest: Effort normal and breath sounds normal. No respiratory distress. She has no wheezes. She has no rales.  Breast: deferred to Gyn Abdominal: Soft. She exhibits no distension. There is no tenderness.  Lymphadenopathy: She has no cervical adenopathy.  Skin: Skin is warm and dry. She is not diaphoretic.  Psychiatric: She has a normal mood and affect. Her behavior is normal.         Assessment & Plan:   Wellness Exam: Immunizations Flu vaccine  today, discussed pneumonia vaccine -Prevnar Colonoscopy  up-to-date Mammogram  up-to-date Dexa  up-to-date Gyn  up to date Eye exam  up-to-date Hearing loss - yes, secondary to hydrops. Following with ENT Memory concerns/difficulties - none, only mild recall difficulties normal for Independent of ADLs-fully independent Stressed the importance of regular exercise, which she does do   Patient received copy of preventative screening tests/immunizations recommended for the next 5-10 years.  See Problem List for Assessment and Plan of chronic medical problems.  Follow up annually

## 2016-07-28 NOTE — Patient Instructions (Signed)
Brittany Myers , Thank you for taking time to come for your Medicare Wellness Visit. I appreciate your ongoing commitment to your health goals. Please review the following plan we discussed and let me know if I can assist you in the future.   These are the goals we discussed: Goals    None      This is a list of the screening recommended for you and due dates:  Health Maintenance  Topic Date Due  . Pap Smear  Up to date  . Pneumonia vaccines (1 of 2 - PCV13) 04/02/2016  . Tetanus Vaccine  11/25/2016  . Mammogram  09/06/2017  . Colon Cancer Screening  03/24/2022  . Flu Shot  Completed  . DEXA scan (bone density measurement)  Completed  . Shingles Vaccine  Completed  .  Hepatitis C: One time screening is recommended by Center for Disease Control  (CDC) for  adults born from [birth year] through 1965.   Completed  . HIV Screening  Completed   Health Maintenance, Female Adopting a healthy lifestyle and getting preventive care can go a long way to promote health and wellness. Talk with your health care provider about what schedule of regular examinations is right for you. This is a good chance for you to check in with your provider about disease prevention and staying healthy. In between checkups, there are plenty of things you can do on your own. Experts have done a lot of research about which lifestyle changes and preventive measures are most likely to keep you healthy. Ask your health care provider for more information. WEIGHT AND DIET  Eat a healthy diet  Be sure to include plenty of vegetables, fruits, low-fat dairy products, and lean protein.  Do not eat a lot of foods high in solid fats, added sugars, or salt.  Get regular exercise. This is one of the most important things you can do for your health.  Most adults should exercise for at least 150 minutes each week. The exercise should increase your heart rate and make you sweat (moderate-intensity exercise).  Most adults should also do  strengthening exercises at least twice a week. This is in addition to the moderate-intensity exercise.  Maintain a healthy weight  Body mass index (BMI) is a measurement that can be used to identify possible weight problems. It estimates body fat based on height and weight. Your health care provider can help determine your BMI and help you achieve or maintain a healthy weight.  For females 7 years of age and older:   A BMI below 18.5 is considered underweight.  A BMI of 18.5 to 24.9 is normal.  A BMI of 25 to 29.9 is considered overweight.  A BMI of 30 and above is considered obese.  Watch levels of cholesterol and blood lipids  You should start having your blood tested for lipids and cholesterol at 65 years of age, then have this test every 5 years.  You may need to have your cholesterol levels checked more often if:  Your lipid or cholesterol levels are high.  You are older than 65 years of age.  You are at high risk for heart disease.  CANCER SCREENING   Lung Cancer  Lung cancer screening is recommended for adults 55-62 years old who are at high risk for lung cancer because of a history of smoking.  A yearly low-dose CT scan of the lungs is recommended for people who:  Currently smoke.  Have quit within the past 15  years.  Have at least a 30-pack-year history of smoking. A pack year is smoking an average of one pack of cigarettes a day for 1 year.  Yearly screening should continue until it has been 15 years since you quit.  Yearly screening should stop if you develop a health problem that would prevent you from having lung cancer treatment.  Breast Cancer  Practice breast self-awareness. This means understanding how your breasts normally appear and feel.  It also means doing regular breast self-exams. Let your health care provider know about any changes, no matter how small.  If you are in your 20s or 30s, you should have a clinical breast exam (CBE) by a  health care provider every 1-3 years as part of a regular health exam.  If you are 68 or older, have a CBE every year. Also consider having a breast X-ray (mammogram) every year.  If you have a family history of breast cancer, talk to your health care provider about genetic screening.  If you are at high risk for breast cancer, talk to your health care provider about having an MRI and a mammogram every year.  Breast cancer gene (BRCA) assessment is recommended for women who have family members with BRCA-related cancers. BRCA-related cancers include:  Breast.  Ovarian.  Tubal.  Peritoneal cancers.  Results of the assessment will determine the need for genetic counseling and BRCA1 and BRCA2 testing. Cervical Cancer Your health care provider may recommend that you be screened regularly for cancer of the pelvic organs (ovaries, uterus, and vagina). This screening involves a pelvic examination, including checking for microscopic changes to the surface of your cervix (Pap test). You may be encouraged to have this screening done every 3 years, beginning at age 65.  For women ages 39-65, health care providers may recommend pelvic exams and Pap testing every 3 years, or they may recommend the Pap and pelvic exam, combined with testing for human papilloma virus (HPV), every 5 years. Some types of HPV increase your risk of cervical cancer. Testing for HPV may also be done on women of any age with unclear Pap test results.  Other health care providers may not recommend any screening for nonpregnant women who are considered low risk for pelvic cancer and who do not have symptoms. Ask your health care provider if a screening pelvic exam is right for you.  If you have had past treatment for cervical cancer or a condition that could lead to cancer, you need Pap tests and screening for cancer for at least 20 years after your treatment. If Pap tests have been discontinued, your risk factors (such as having a  new sexual partner) need to be reassessed to determine if screening should resume. Some women have medical problems that increase the chance of getting cervical cancer. In these cases, your health care provider may recommend more frequent screening and Pap tests. Colorectal Cancer  This type of cancer can be detected and often prevented.  Routine colorectal cancer screening usually begins at 65 years of age and continues through 65 years of age.  Your health care provider may recommend screening at an earlier age if you have risk factors for colon cancer.  Your health care provider may also recommend using home test kits to check for hidden blood in the stool.  A small camera at the end of a tube can be used to examine your colon directly (sigmoidoscopy or colonoscopy). This is done to check for the earliest forms of colorectal  cancer.  Routine screening usually begins at age 66.  Direct examination of the colon should be repeated every 5-10 years through 65 years of age. However, you may need to be screened more often if early forms of precancerous polyps or small growths are found. Skin Cancer  Check your skin from head to toe regularly.  Tell your health care provider about any new moles or changes in moles, especially if there is a change in a mole's shape or color.  Also tell your health care provider if you have a mole that is larger than the size of a pencil eraser.  Always use sunscreen. Apply sunscreen liberally and repeatedly throughout the day.  Protect yourself by wearing long sleeves, pants, a wide-brimmed hat, and sunglasses whenever you are outside. HEART DISEASE, DIABETES, AND HIGH BLOOD PRESSURE   High blood pressure causes heart disease and increases the risk of stroke. High blood pressure is more likely to develop in:  People who have blood pressure in the high end of the normal range (130-139/85-89 mm Hg).  People who are overweight or obese.  People who are  African American.  If you are 12-66 years of age, have your blood pressure checked every 3-5 years. If you are 44 years of age or older, have your blood pressure checked every year. You should have your blood pressure measured twice--once when you are at a hospital or clinic, and once when you are not at a hospital or clinic. Record the average of the two measurements. To check your blood pressure when you are not at a hospital or clinic, you can use:  An automated blood pressure machine at a pharmacy.  A home blood pressure monitor.  If you are between 49 years and 64 years old, ask your health care provider if you should take aspirin to prevent strokes.  Have regular diabetes screenings. This involves taking a blood sample to check your fasting blood sugar level.  If you are at a normal weight and have a low risk for diabetes, have this test once every three years after 65 years of age.  If you are overweight and have a high risk for diabetes, consider being tested at a younger age or more often. PREVENTING INFECTION  Hepatitis B  If you have a higher risk for hepatitis B, you should be screened for this virus. You are considered at high risk for hepatitis B if:  You were born in a country where hepatitis B is common. Ask your health care provider which countries are considered high risk.  Your parents were born in a high-risk country, and you have not been immunized against hepatitis B (hepatitis B vaccine).  You have HIV or AIDS.  You use needles to inject street drugs.  You live with someone who has hepatitis B.  You have had sex with someone who has hepatitis B.  You get hemodialysis treatment.  You take certain medicines for conditions, including cancer, organ transplantation, and autoimmune conditions. Hepatitis C  Blood testing is recommended for:  Everyone born from [birth year] through 1965.  Anyone with known risk factors for hepatitis C. Sexually transmitted infections  (STIs)  You should be screened for sexually transmitted infections (STIs) including gonorrhea and chlamydia if:  You are sexually active and are younger than 65 years of age.  You are older than 65 years of age and your health care provider tells you that you are at risk for this type of infection.  Your sexual activity  has changed since you were last screened and you are at an increased risk for chlamydia or gonorrhea. Ask your health care provider if you are at risk.  If you do not have HIV, but are at risk, it may be recommended that you take a prescription medicine daily to prevent HIV infection. This is called pre-exposure prophylaxis (PrEP). You are considered at risk if:  You are sexually active and do not regularly use condoms or know the HIV status of your partner(s).  You take drugs by injection.  You are sexually active with a partner who has HIV. Talk with your health care provider about whether you are at high risk of being infected with HIV. If you choose to begin PrEP, you should first be tested for HIV. You should then be tested every 3 months for as long as you are taking PrEP.  PREGNANCY   If you are premenopausal and you may become pregnant, ask your health care provider about preconception counseling.  If you may become pregnant, take 400 to 800 micrograms (mcg) of folic acid every day.  If you want to prevent pregnancy, talk to your health care provider about birth control (contraception). OSTEOPOROSIS AND MENOPAUSE   Osteoporosis is a disease in which the bones lose minerals and strength with aging. This can result in serious bone fractures. Your risk for osteoporosis can be identified using a bone density scan.  If you are 44 years of age or older, or if you are at risk for osteoporosis and fractures, ask your health care provider if you should be screened.  Ask your health care provider whether you should take a calcium or vitamin D supplement to lower your risk  for osteoporosis.  Menopause may have certain physical symptoms and risks.  Hormone replacement therapy may reduce some of these symptoms and risks. Talk to your health care provider about whether hormone replacement therapy is right for you.  HOME CARE INSTRUCTIONS   Schedule regular health, dental, and eye exams.  Stay current with your immunizations.   Do not use any tobacco products including cigarettes, chewing tobacco, or electronic cigarettes.  If you are pregnant, do not drink alcohol.  If you are breastfeeding, limit how much and how often you drink alcohol.  Limit alcohol intake to no more than 1 drink per day for nonpregnant women. One drink equals 12 ounces of beer, 5 ounces of wine, or 1 ounces of hard liquor.  Do not use street drugs.  Do not share needles.  Ask your health care provider for help if you need support or information about quitting drugs.  Tell your health care provider if you often feel depressed.  Tell your health care provider if you have ever been abused or do not feel safe at home.   This information is not intended to replace advice given to you by your health care provider. Make sure you discuss any questions you have with your health care provider.   Document Released: 03/31/2011 Document Revised: 10/06/2014 Document Reviewed: 08/17/2013 Elsevier Interactive Patient Education [employer].

## 2016-11-03 DIAGNOSIS — Z1231 Encounter for screening mammogram for malignant neoplasm of breast: Secondary | ICD-10-CM | POA: Diagnosis not present

## 2016-12-05 ENCOUNTER — Ambulatory Visit (INDEPENDENT_AMBULATORY_CARE_PROVIDER_SITE_OTHER): Payer: Medicare Other

## 2016-12-05 DIAGNOSIS — Z23 Encounter for immunization: Secondary | ICD-10-CM | POA: Diagnosis not present

## 2016-12-05 NOTE — Progress Notes (Signed)
Injection given.   Alastor Kneale J Denim Start, MD  

## 2017-01-16 ENCOUNTER — Ambulatory Visit (INDEPENDENT_AMBULATORY_CARE_PROVIDER_SITE_OTHER): Payer: Medicare Other

## 2017-01-16 DIAGNOSIS — Z23 Encounter for immunization: Secondary | ICD-10-CM | POA: Diagnosis not present

## 2017-01-16 NOTE — Progress Notes (Signed)
Injection given.   Manas Hickling J Ernestina Joe, MD  

## 2017-01-23 DIAGNOSIS — L821 Other seborrheic keratosis: Secondary | ICD-10-CM | POA: Diagnosis not present

## 2017-01-23 DIAGNOSIS — I788 Other diseases of capillaries: Secondary | ICD-10-CM | POA: Diagnosis not present

## 2017-01-23 DIAGNOSIS — Z85828 Personal history of other malignant neoplasm of skin: Secondary | ICD-10-CM | POA: Diagnosis not present

## 2017-01-23 DIAGNOSIS — L570 Actinic keratosis: Secondary | ICD-10-CM | POA: Diagnosis not present

## 2017-03-04 DIAGNOSIS — H5203 Hypermetropia, bilateral: Secondary | ICD-10-CM | POA: Diagnosis not present

## 2017-03-04 DIAGNOSIS — Z01419 Encounter for gynecological examination (general) (routine) without abnormal findings: Secondary | ICD-10-CM | POA: Diagnosis not present

## 2017-03-04 DIAGNOSIS — L298 Other pruritus: Secondary | ICD-10-CM | POA: Diagnosis not present

## 2017-03-04 DIAGNOSIS — H04123 Dry eye syndrome of bilateral lacrimal glands: Secondary | ICD-10-CM | POA: Diagnosis not present

## 2017-03-04 DIAGNOSIS — Z779 Other contact with and (suspected) exposures hazardous to health: Secondary | ICD-10-CM | POA: Diagnosis not present

## 2017-03-04 DIAGNOSIS — L900 Lichen sclerosus et atrophicus: Secondary | ICD-10-CM | POA: Diagnosis not present

## 2017-03-04 DIAGNOSIS — H524 Presbyopia: Secondary | ICD-10-CM | POA: Diagnosis not present

## 2017-03-04 DIAGNOSIS — N952 Postmenopausal atrophic vaginitis: Secondary | ICD-10-CM | POA: Diagnosis not present

## 2017-03-04 DIAGNOSIS — H52223 Regular astigmatism, bilateral: Secondary | ICD-10-CM | POA: Diagnosis not present

## 2017-03-19 ENCOUNTER — Telehealth: Payer: Self-pay | Admitting: Internal Medicine

## 2017-03-19 DIAGNOSIS — K219 Gastro-esophageal reflux disease without esophagitis: Secondary | ICD-10-CM

## 2017-03-19 NOTE — Telephone Encounter (Signed)
Pt would like a refill for ranitidine (ZANTAC) 150 MG tablet  She states burns would refill them when she had her annual,   [facility] on [address] in [place]   Would like a call when done

## 2017-03-20 MED ORDER — RANITIDINE HCL 150 MG PO TABS: 150.0000 mg | ORAL_TABLET | Freq: Every day | ORAL | 0 refills | Status: DC

## 2017-03-20 NOTE — Telephone Encounter (Signed)
Sent to pharmacy, pt is aware.

## 2017-04-07 ENCOUNTER — Other Ambulatory Visit (INDEPENDENT_AMBULATORY_CARE_PROVIDER_SITE_OTHER): Payer: Medicare Other

## 2017-04-07 ENCOUNTER — Encounter: Payer: Self-pay | Admitting: Nurse Practitioner

## 2017-04-07 ENCOUNTER — Ambulatory Visit (INDEPENDENT_AMBULATORY_CARE_PROVIDER_SITE_OTHER): Payer: Medicare Other | Admitting: Nurse Practitioner

## 2017-04-07 VITALS — BP 110/70 | HR 56 | Temp 98.1°F | Ht 64.0 in | Wt 127.0 lb

## 2017-04-07 DIAGNOSIS — R5381 Other malaise: Secondary | ICD-10-CM

## 2017-04-07 DIAGNOSIS — R5383 Other fatigue: Secondary | ICD-10-CM | POA: Diagnosis not present

## 2017-04-07 DIAGNOSIS — R238 Other skin changes: Secondary | ICD-10-CM

## 2017-04-07 DIAGNOSIS — K219 Gastro-esophageal reflux disease without esophagitis: Secondary | ICD-10-CM | POA: Diagnosis not present

## 2017-04-07 LAB — BASIC METABOLIC PANEL
BUN: 20 mg/dL (ref 6–23)
CALCIUM: 9.5 mg/dL (ref 8.4–10.5)
CHLORIDE: 104 meq/L (ref 96–112)
CO2: 27 meq/L (ref 19–32)
Creatinine, Ser: 0.76 mg/dL (ref 0.40–1.20)
GFR: 80.92 mL/min (ref >60.00)
Glucose, Bld: 98 mg/dL (ref 70–99)
POTASSIUM: 4.3 meq/L (ref 3.5–5.1)
SODIUM: 139 meq/L (ref 135–145)

## 2017-04-07 LAB — CBC
HCT: 44.2 % (ref 36.0–46.0)
Hemoglobin: 15.1 g/dL — ABNORMAL HIGH (ref 12.0–15.0)
MCHC: 34.2 g/dL (ref 30.0–36.0)
MCV: 92.1 fl (ref 78.0–100.0)
Platelets: 225 10*3/uL (ref 150.0–400.0)
RBC: 4.79 Mil/uL (ref 3.87–5.11)
RDW: 12.3 % (ref 11.5–15.5)
WBC: 6.4 10*3/uL (ref 4.0–10.5)

## 2017-04-07 LAB — TSH: TSH: 2.73 u[IU]/mL (ref 0.35–4.50)

## 2017-04-07 LAB — SEDIMENTATION RATE: Sed Rate: 1 mm/hr (ref 0–30)

## 2017-04-07 LAB — CK: Total CK: 51 U/L (ref 7–177)

## 2017-04-07 MED ORDER — RANITIDINE HCL 150 MG PO TABS: 150.0000 mg | ORAL_TABLET | Freq: Every day | ORAL | 2 refills | Status: DC

## 2017-04-07 NOTE — Patient Instructions (Addendum)
Go to basement for blood draw. You will be called with results.  If labs are normal, let me know if you will like to try valacylovir for rash. You can continue use of calamine lotion OTC as needed.  Ranitidine prescription resent. Valtrex prescription sent after review of lab results

## 2017-04-07 NOTE — Progress Notes (Signed)
Subjective:  Patient ID: Brittany Myers, female    DOB: December 08, 1950  Age: 66 y.o. MRN: [id number]  CC: Herpes Zoster (red spot/rash/itching on left wrist,fatigue,headache,bodyache---using cream going on the end of June. hx shingle. med consult)   Rash  This is a new problem. The current episode started 1 to 4 weeks ago. The problem is unchanged. The affected locations include the left wrist. The rash is characterized by blistering, itchiness and redness. It is unknown if there was an exposure to a precipitant. Associated symptoms include fatigue. Pertinent negatives include no anorexia, congestion, cough, diarrhea, eye pain, facial edema, fever, joint pain, rhinorrhea, shortness of breath, sore throat or vomiting. Past treatments include anti-itch cream. The treatment provided mild relief. Her past medical history is significant for varicella. There is no history of allergies, asthma or eczema.    Outpatient Medications Prior to Visit  Medication Sig Dispense Refill  . clobetasol cream (TEMOVATE) 4.09 % Apply 1 application topically as needed.    Marland Kitchen EPINEPHrine 0.3 mg/0.3 mL IJ SOAJ injection Inject 0.3 mLs (0.3 mg total) into the muscle as needed. 2 Device 2  . estradiol (ESTRACE) 0.1 MG/GM vaginal cream Place 1 Applicatorful vaginally 2 (two) times a week.     Marland Kitchen LORazepam (ATIVAN) 0.5 MG tablet take 1 tablet by mouth every 8 hours if needed for anxiety 30 tablet 2  . triamterene-hydrochlorothiazide (DYAZIDE) 37.5-25 MG capsule Take 1 capsule by mouth daily.    . ranitidine (ZANTAC) 150 MG tablet Take 1 tablet (150 mg total) by mouth at bedtime. (Patient not taking: Reported on 04/07/2017) 90 tablet 0   No facility-administered medications prior to visit.     ROS See HPI  Objective:  BP 110/70   Pulse (!) 56   Temp 98.1 F (36.7 C)   Ht 5' 4"  (1.626 m)   Wt 127 lb (57.6 kg)   SpO2 98%   BMI 21.80 kg/m   BP Readings from Last 3 Encounters:  04/07/17 110/70  07/28/16 124/82    03/11/16 112/84    Wt Readings from Last 3 Encounters:  04/07/17 127 lb (57.6 kg)  07/28/16 129 lb (58.5 kg)  03/11/16 129 lb (58.5 kg)    Physical Exam  Constitutional: She is oriented to person, place, and time. No distress.  HENT:  Right Ear: External ear normal.  Left Ear: External ear normal.  Nose: Nose normal.  Mouth/Throat: Oropharynx is clear and moist. No oropharyngeal exudate.  Neck: Normal range of motion. Neck supple. No thyromegaly present.  Cardiovascular: Normal rate, regular rhythm and normal heart sounds.   Pulmonary/Chest: Effort normal and breath sounds normal.  Musculoskeletal: Normal range of motion.  Lymphadenopathy:    She has no cervical adenopathy.  Neurological: She is alert and oriented to person, place, and time.  Skin: Skin is warm. Rash noted. Rash is vesicular. There is erythema.     Vitals reviewed.   Lab Results  Component Value Date   WBC 6.4 04/07/2017   HGB 15.1 (H) 04/07/2017   HCT 44.2 04/07/2017   PLT 225.0 04/07/2017   GLUCOSE 98 04/07/2017   CHOL 178 07/28/2016   TRIG 67.0 07/28/2016   HDL 63.00 07/28/2016   LDLDIRECT 116.2 05/20/2012   LDLCALC 102 (H) 07/28/2016   ALT 14 07/28/2016   AST 17 07/28/2016   NA 139 04/07/2017   K 4.3 04/07/2017   CL 104 04/07/2017   CREATININE 0.76 04/07/2017   BUN 20 04/07/2017   CO2 27 04/07/2017  TSH 2.73 04/07/2017   HGBA1C 5.3 07/28/2016    Dg Chest 2 View  Result Date: 12/06/2013 CLINICAL DATA:  Cough.  Recent influenza. EXAM: CHEST  2 VIEW COMPARISON:  08/12/2012 FINDINGS: The heart size and mediastinal contours are within normal limits. Both lungs are clear. No interval change compared to chest radiograph of November tear 2013. The visualized skeletal structures are unremarkable. IMPRESSION: No active cardiopulmonary disease. Electronically Signed   By: Curlene Dolphin M.D.   On: 12/06/2013 10:31    Assessment & Plan:   Shyan was seen today for herpes zoster.  Diagnoses and  all orders for this visit:  Gastroesophageal reflux disease, esophagitis presence not specified -     Discontinue: ranitidine (ZANTAC) 150 MG tablet; Take 1 tablet (150 mg total) by mouth at bedtime.  Malaise and fatigue -     Basic metabolic panel; Future -     TSH; Future -     CBC; Future -     Sed Rate (ESR); Future -     CK; Future  Vesicular rash   I have discontinued Ms. Pertuit's ranitidine. I am also having her maintain her estradiol, triamterene-hydrochlorothiazide, clobetasol cream, EPINEPHrine, and LORazepam.  Meds ordered this encounter  Medications  . DISCONTD: ranitidine (ZANTAC) 150 MG tablet    Sig: Take 1 tablet (150 mg total) by mouth at bedtime.    Dispense:  30 tablet    Refill:  2    Order Specific Question:   Supervising Provider    Answer:   Cassandria Anger [[id number]]    Follow-up: Return if symptoms worsen or fail to improve.  Wilfred Lacy, NP

## 2017-04-08 ENCOUNTER — Telehealth: Payer: Self-pay | Admitting: Internal Medicine

## 2017-04-08 ENCOUNTER — Telehealth: Payer: Self-pay | Admitting: Nurse Practitioner

## 2017-04-08 DIAGNOSIS — K219 Gastro-esophageal reflux disease without esophagitis: Secondary | ICD-10-CM

## 2017-04-08 DIAGNOSIS — R238 Other skin changes: Secondary | ICD-10-CM

## 2017-04-08 MED ORDER — RANITIDINE HCL 150 MG PO TABS: 150.0000 mg | ORAL_TABLET | Freq: Every day | ORAL | 2 refills | Status: DC

## 2017-04-08 MED ORDER — VALACYCLOVIR HCL 1 G PO TABS: 1000.0000 mg | ORAL_TABLET | Freq: Three times a day (TID) | ORAL | 0 refills | Status: DC

## 2017-04-08 NOTE — Telephone Encounter (Signed)
-----   Message from Shawnie Pons, LPN sent at 1/46/0479  8:32 AM EDT ----- Please send in abx and resend zantac to [facility] ( it didn't go yesterday).   Can pt take tylenol for pain?

## 2017-04-08 NOTE — Telephone Encounter (Signed)
Please also send in the Valtrex as well, same pharmacy

## 2017-04-08 NOTE — Telephone Encounter (Signed)
Pharmacy did nto receive ranitidine (ZANTAC) 150 MG tablet  Please resend or refax to [facility] on [address]     NCPDP# [phone]   Fax [phone]

## 2017-06-25 DIAGNOSIS — H903 Sensorineural hearing loss, bilateral: Secondary | ICD-10-CM | POA: Diagnosis not present

## 2017-06-25 DIAGNOSIS — H93231 Hyperacusis, right ear: Secondary | ICD-10-CM | POA: Diagnosis not present

## 2017-06-25 DIAGNOSIS — G4452 New daily persistent headache (NDPH): Secondary | ICD-10-CM | POA: Diagnosis not present

## 2017-06-26 ENCOUNTER — Other Ambulatory Visit: Payer: Self-pay | Admitting: Otolaryngology

## 2017-06-26 DIAGNOSIS — H93231 Hyperacusis, right ear: Secondary | ICD-10-CM

## 2017-06-26 DIAGNOSIS — G4452 New daily persistent headache (NDPH): Secondary | ICD-10-CM

## 2017-06-26 DIAGNOSIS — H903 Sensorineural hearing loss, bilateral: Secondary | ICD-10-CM

## 2017-07-17 DIAGNOSIS — Z23 Encounter for immunization: Secondary | ICD-10-CM | POA: Diagnosis not present

## 2017-08-01 ENCOUNTER — Ambulatory Visit
Admission: RE | Admit: 2017-08-01 | Discharge: 2017-08-01 | Disposition: A | Discharge status: Home / Self Care | Payer: Medicare Other | Source: Ambulatory Visit | Attending: Otolaryngology | Admitting: Otolaryngology

## 2017-08-01 DIAGNOSIS — H93231 Hyperacusis, right ear: Secondary | ICD-10-CM

## 2017-08-01 DIAGNOSIS — H903 Sensorineural hearing loss, bilateral: Secondary | ICD-10-CM | POA: Diagnosis not present

## 2017-08-01 DIAGNOSIS — G4452 New daily persistent headache (NDPH): Secondary | ICD-10-CM

## 2017-08-01 MED ORDER — GADOBENATE DIMEGLUMINE 529 MG/ML IV SOLN
10.0000 mL | Freq: Once | INTRAVENOUS | Status: AC | PRN
  Administered 2017-08-01: 10 mL via INTRAVENOUS

## 2017-08-17 ENCOUNTER — Ambulatory Visit: Payer: Medicare Other | Admitting: Internal Medicine

## 2017-08-27 DIAGNOSIS — H16143 Punctate keratitis, bilateral: Secondary | ICD-10-CM | POA: Diagnosis not present

## 2017-09-23 DIAGNOSIS — M85851 Other specified disorders of bone density and structure, right thigh: Secondary | ICD-10-CM | POA: Diagnosis not present

## 2017-09-28 DIAGNOSIS — N898 Other specified noninflammatory disorders of vagina: Secondary | ICD-10-CM | POA: Diagnosis not present

## 2017-11-16 ENCOUNTER — Ambulatory Visit: Payer: Medicare Other

## 2017-11-16 ENCOUNTER — Ambulatory Visit: Payer: Self-pay

## 2017-11-16 NOTE — Telephone Encounter (Signed)
Pt. Will try home remedies. If no better in 4-7 days will call back.States she thinks she is starting to get better from this.  Reason for Disposition . MILD-MODERATE diarrhea (e.g., 1-6 times / day more than normal)  Answer Assessment - Initial Assessment Questions 1. DIARRHEA SEVERITY: "How bad is the diarrhea?" "How many extra stools have you had in the past 24 hours than normal?"    - MILD: Few loose or mushy BMs; increase of 1-3 stools over normal daily number of stools; mild increase in ostomy output.   - MODERATE: Increase of 4-6 stools daily over normal; moderate increase in ostomy output.   - SEVERE (or Worst Possible): Increase of 7 or more stools daily over normal; moderate increase in ostomy output; incontinence.     3 2. ONSET: "When did the diarrhea begin?"      Yesterday 3. BM CONSISTENCY: "How loose or watery is the diarrhea?"      Loose 4. VOMITING: "Are you also vomiting?" If so, ask: "How many times in the past 24 hours?"      No 5. ABDOMINAL PAIN: "Are you having any abdominal pain?" If yes: "What does it feel like?" (e.g., crampy, dull, intermittent, constant)      Cramping 6. ABDOMINAL PAIN SEVERITY: If present, ask: "How bad is the pain?"  (e.g., Scale 1-10; mild, moderate, or severe)    - MILD (1-3): doesn't interfere with normal activities, abdomen soft and not tender to touch     - MODERATE (4-7): interferes with normal activities or awakens from sleep, tender to touch     - SEVERE (8-10): excruciating pain, doubled over, unable to do any normal activities       Moderate 7. ORAL INTAKE: If vomiting, "Have you been able to drink liquids?" "How much fluids have you had in the past 24 hours?"     Keeping fluids 8. HYDRATION: "Any signs of dehydration?" (e.g., dry mouth [not just dry lips], too weak to stand, dizziness, new weight loss) "When did you last urinate?"     No 9. EXPOSURE: "Have you traveled to a foreign country recently?" "Have you been exposed to anyone  with diarrhea?" "Could you have eaten any food that was spoiled?"     No 10. OTHER SYMPTOMS: "Do you have any other symptoms?" (e.g., fever, blood in stool)       Fever 11. PREGNANCY: "Is there any chance you are pregnant?" "When was your last menstrual period?"       No  Protocols used: DIARRHEA-A-AH

## 2017-12-14 DIAGNOSIS — Z1231 Encounter for screening mammogram for malignant neoplasm of breast: Secondary | ICD-10-CM | POA: Diagnosis not present

## 2017-12-14 LAB — HM MAMMOGRAPHY

## 2017-12-17 ENCOUNTER — Encounter: Payer: Self-pay | Admitting: Internal Medicine

## 2017-12-28 DIAGNOSIS — D1801 Hemangioma of skin and subcutaneous tissue: Secondary | ICD-10-CM | POA: Diagnosis not present

## 2017-12-28 DIAGNOSIS — D225 Melanocytic nevi of trunk: Secondary | ICD-10-CM | POA: Diagnosis not present

## 2017-12-28 DIAGNOSIS — L821 Other seborrheic keratosis: Secondary | ICD-10-CM | POA: Diagnosis not present

## 2017-12-28 DIAGNOSIS — L570 Actinic keratosis: Secondary | ICD-10-CM | POA: Diagnosis not present

## 2017-12-28 DIAGNOSIS — Z85828 Personal history of other malignant neoplasm of skin: Secondary | ICD-10-CM | POA: Diagnosis not present

## 2017-12-28 DIAGNOSIS — L812 Freckles: Secondary | ICD-10-CM | POA: Diagnosis not present

## 2018-01-04 ENCOUNTER — Telehealth: Payer: Self-pay | Admitting: Emergency Medicine

## 2018-01-04 NOTE — Telephone Encounter (Signed)
Called patient to schedule AWV. Patient stated she would give office a call back.

## 2018-03-05 ENCOUNTER — Ambulatory Visit (INDEPENDENT_AMBULATORY_CARE_PROVIDER_SITE_OTHER): Payer: Medicare Other | Admitting: *Deleted

## 2018-03-05 VITALS — BP 102/52 | HR 63 | Resp 17 | Ht 64.0 in | Wt 129.0 lb

## 2018-03-05 DIAGNOSIS — Z23 Encounter for immunization: Secondary | ICD-10-CM

## 2018-03-05 DIAGNOSIS — Z0000 Encounter for general adult medical examination without abnormal findings: Secondary | ICD-10-CM | POA: Diagnosis not present

## 2018-03-05 MED ORDER — LORAZEPAM 0.5 MG PO TABS: ORAL_TABLET | ORAL | 2 refills | Status: DC

## 2018-03-05 MED ORDER — EPINEPHRINE 0.3 MG/0.3ML IJ SOAJ: 0.3000 mg | INTRAMUSCULAR | 2 refills | Status: DC | PRN

## 2018-03-05 NOTE — Progress Notes (Addendum)
Subjective:   Brittany Myers is a 67 y.o. female who presents for an Initial Medicare Annual Wellness Visit.  Review of Systems    No ROS.  Medicare Wellness Visit. Additional risk factors are reflected in the social history.  Cardiac Risk Factors include: advanced age (>10men, >64 women) Sleep patterns: has restless sleep, has frequent nighttime awakenings, gets up 1 times nightly to void and sleeps 5-6 hours nightly.  Patient reports insomnia issues, discussed recommended sleep tips and stress reduction tips.   Home Safety/Smoke Alarms: Feels safe in home. Smoke alarms in place.  Living environment; residence and Firearm Safety: 1-story house/ trailer, no firearms. Lives with husband, no needs for DME, good support system Seat Belt Safety/Bike Helmet: Wears seat belt.      Objective:    Today's Vitals   03/05/18 1634  BP: (!) 102/52  Pulse: 63  Resp: 17  SpO2: 98%  Weight: 129 lb (58.5 kg)  Height: 5\' 4"  (1.626 m)   Body mass index is 22.14 kg/m.  Advanced Directives 03/05/2018  Does Patient Have a Medical Advance Directive? No  Does patient want to make changes to medical advance directive? Yes (ED - Information included in AVS)    Current Medications (verified) Outpatient Encounter Medications as of 03/05/2018  Medication Sig  . clobetasol cream (TEMOVATE) 4.16 % Apply 1 application topically as needed.  Marland Kitchen EPINEPHrine 0.3 mg/0.3 mL IJ SOAJ injection Inject 0.3 mLs (0.3 mg total) into the muscle as needed.  Marland Kitchen estradiol (ESTRACE) 0.1 MG/GM vaginal cream Place 1 Applicatorful vaginally 2 (two) times a week.   Marland Kitchen LORazepam (ATIVAN) 0.5 MG tablet take 1 tablet by mouth every 8 hours if needed for anxiety  . ranitidine (ZANTAC) 150 MG tablet Take 1 tablet (150 mg total) by mouth at bedtime.  . [DISCONTINUED] EPINEPHrine 0.3 mg/0.3 mL IJ SOAJ injection Inject 0.3 mLs (0.3 mg total) into the muscle as needed.  . [DISCONTINUED] LORazepam (ATIVAN) 0.5 MG tablet take 1 tablet by  mouth every 8 hours if needed for anxiety  . [DISCONTINUED] triamterene-hydrochlorothiazide (DYAZIDE) 37.5-25 MG capsule Take 1 capsule by mouth daily.  . [DISCONTINUED] valACYclovir (VALTREX) 1000 MG tablet Take 1 tablet (1,000 mg total) by mouth 3 (three) times daily. (Patient not taking: Reported on 03/05/2018)   No facility-administered encounter medications on file as of 03/05/2018.     Allergies (verified) Ciprofloxacin; Penicillins; Terbinafine hcl; Erythromycin; Minocycline hcl; and Zolpidem tartrate   History: Past Medical History:  Diagnosis Date  . 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 Endometriosis   . Increased homocysteine (HCC)    high-normal  . Osteopenia    T score ; -2.1  @ L femoral neck in 2006  . Plantar fasciitis    Past Surgical History:  Procedure Laterality Date  . COLONOSCOPY  2013   Dr Sharlett Iles  . HEMORRHOID SURGERY    . LAPAROSCOPIC ENDOMETRIOSIS FULGURATION     5 surgeries; Dr Irven [place], Concha [place]  . POLYPECTOMY  2001    Dr.Sam Hardin    Family History  Problem Relation Age of Onset  . Hyperlipidemia Brother   . Heart failure Father        heart attack @ 54 after CVA  . Stroke Father 57       hemorrhagic  after MI  . Diabetes Father   . Endometriosis Mother   . Hyperlipidemia Mother   . Osteopenia Mother   . Heart attack Maternal Grandfather 65  . Liver cancer  Maternal Aunt   . Colon cancer Paternal Grandmother   . Alzheimer's disease Maternal Grandmother    Social History   Socioeconomic History  . Marital status: Married    Spouse name: Not on file  . Number of children: Not on file  . Years of education: Not on file  . Highest education level: Not on file  Occupational History  . Occupation: Human resources officer  Social Needs  . Financial resource strain: Not hard at all  . Food insecurity:    Worry: Never true    Inability: Never true  . Transportation needs:    Medical: No    Non-medical: No  Tobacco Use  . Smoking  status: Never Smoker  . Smokeless tobacco: Never Used  Substance and Sexual Activity  . Alcohol use: Yes    Alcohol/week: 0.0 oz    Comment:   3/ week  . Drug use: No  . Sexual activity: Yes  Lifestyle  . Physical activity:    Days per week: 3 days    Minutes per session: 30 min  . Stress: Very much  Relationships  . Social connections:    Talks on phone: More than three times a week    Gets together: More than three times a week    Attends religious service: More than 4 times per year    Active member of club or organization: Not on file    Attends meetings of clubs or organizations: Not on file    Relationship status: Married  Other Topics Concern  . Not on file  Social History Narrative   Heart healthy diet   Regular Exercise-no    Tobacco Counseling Counseling given: Not Answered  Activities of Daily Living In your present state of health, do you have any difficulty performing the following activities: 03/05/2018  Hearing? N  Vision? N  Difficulty concentrating or making decisions? N  Walking or climbing stairs? N  Dressing or bathing? N  Doing errands, shopping? N  Preparing Food and eating ? N  Using the Toilet? N  In the past six months, have you accidently leaked urine? N  Do you have problems with loss of bowel control? N  Managing your Medications? N  Managing your Finances? N  Housekeeping or managing your Housekeeping? N  Some recent data might be hidden     Immunizations and Health Maintenance Immunization History  Administered Date(s) Administered  . Influenza Split 07/31/2011, 07/14/2012  . Influenza Whole 07/18/2008, 10/10/2009, 06/18/2010  . Influenza, High Dose Seasonal PF 07/28/2016  . Influenza,inj,Quad PF,6+ Mos 07/20/2013, 07/20/2014, 07/24/2015  . Pneumococcal Conjugate-13 12/05/2016  . Tdap 11/25/2006, 01/16/2017  . Zoster 03/26/2016   Health Maintenance Due  Topic Date Due  . PNA vac Low Risk Adult (2 of 2 - PPSV23) 12/05/2017     Patient Care Team: Binnie Rail, MD as PCP - General (Internal Medicine)  Indicate any recent Medical Services you may have received from other than [facility] providers in the past year (date may be approximate).     Assessment:   This is a routine wellness examination for Brittany Myers. Physical assessment deferred to PCP.   Hearing/Vision screen Hearing Screening Comments: Able to hear conversational tones. Tinnitus  Passed whisper test Vision Screening Comments: appointment yearly Dr. Sabra Heck  Dietary issues and exercise activities discussed: Current Exercise Habits: The patient has a physically strenous job, but has no regular exercise apart from work., Exercise limited by: None identified  Diet (meal preparation, eat out,  water intake, caffeinated beverages, dairy products, fruits and vegetables): in general, a "healthy" diet  , well balanced,  eats a variety of fruits and vegetables daily, limits salt, fat/cholesterol, sugar,carbohydrates,caffeine, drinks 6-8 glasses of water daily.  Goals    None     Depression Screen PHQ 2/9 Scores 03/05/2018 07/28/2016 07/19/2013  PHQ - 2 Score 1 0 0    Fall Risk Fall Risk  03/05/2018 07/28/2016 07/19/2013  Falls in the past year? No Yes Yes  Number falls in past yr: - 1 1  [place] working  Injury with Fall? - No (No Data)  Comment - - Left hand pain   Cognitive Function:       Ad8 score reviewed for issues:  Issues making decisions: no  Less interest in hobbies / activities: no  Repeats questions, stories (family complaining): no  Trouble using ordinary gadgets (microwave, computer, phone):no  Forgets the month or year: no  Mismanaging finances: no  Remembering appts: no  Daily problems with thinking and/or memory: no Ad8 score is= 0  Screening Tests Health Maintenance  Topic Date Due  . PNA vac Low Risk Adult (2 of 2 - PPSV23) 12/05/2017  . INFLUENZA VACCINE  04/29/2018  . MAMMOGRAM  12/15/2019  . DEXA  SCAN  09/23/2020  . COLONOSCOPY  03/24/2022  . TETANUS/TDAP  01/17/2027  . Hepatitis C Screening  Completed     Plan:    Continue doing brain stimulating activities (puzzles, reading, adult coloring books, staying active) to keep memory sharp.   Continue to eat heart healthy diet (full of fruits, vegetables, whole grains, lean protein, water--limit salt, fat, and sugar intake) and increase physical activity as tolerated.  I have personally reviewed and noted the following in the patient's chart:   . Medical and social history . Use of alcohol, tobacco or illicit drugs  . Current medications and supplements . Functional ability and status . Nutritional status . Physical activity . Advanced directives . List of other physicians . Vitals . Screenings to include cognitive, depression, and falls . Referrals and appointments  In addition, I have reviewed and discussed with patient certain preventive protocols, quality metrics, and best practice recommendations. A written personalized care plan for preventive services as well as general preventive health recommendations were provided to patient.     Michiel Cowboy, RN   03/05/2018    Medical screening examination/treatment/procedure(s) were performed by non-physician practitioner and as supervising physician I was immediately available for consultation/collaboration. I agree with above. Binnie Rail, MD

## 2018-03-05 NOTE — Patient Instructions (Signed)
Continue doing brain stimulating activities (puzzles, reading, adult coloring books, staying active) to keep memory sharp.   Continue to eat heart healthy diet (full of fruits, vegetables, whole grains, lean protein, water--limit salt, fat, and sugar intake) and increase physical activity as tolerated.  Health Maintenance, Female Adopting a healthy lifestyle and getting preventive care can go a long way to promote health and wellness. Talk with your health care provider about what schedule of regular examinations is right for you. This is a good chance for you to check in with your provider about disease prevention and staying healthy. In between checkups, there are plenty of things you can do on your own. Experts have done a lot of research about which lifestyle changes and preventive measures are most likely to keep you healthy. Ask your health care provider for more information. Weight and diet Eat a healthy diet  Be sure to include plenty of vegetables, fruits, low-fat dairy products, and lean protein.  Do not eat a lot of foods high in solid fats, added sugars, or salt.  Get regular exercise. This is one of the most important things you can do for your health. ? Most adults should exercise for at least 150 minutes each week. The exercise should increase your heart rate and make you sweat (moderate-intensity exercise). ? Most adults should also do strengthening exercises at least twice a week. This is in addition to the moderate-intensity exercise.  Maintain a healthy weight  Body mass index (BMI) is a measurement that can be used to identify possible weight problems. It estimates body fat based on height and weight. Your health care provider can help determine your BMI and help you achieve or maintain a healthy weight.  For females 40 years of age and older: ? A BMI below 18.5 is considered underweight. ? A BMI of 18.5 to 24.9 is normal. ? A BMI of 25 to 29.9 is considered  overweight. ? A BMI of 30 and above is considered obese.  Watch levels of cholesterol and blood lipids  You should start having your blood tested for lipids and cholesterol at 67 years of age, then have this test every 5 years.  You may need to have your cholesterol levels checked more often if: ? Your lipid or cholesterol levels are high. ? You are older than 67 years of age. ? You are at high risk for heart disease.  Cancer screening Lung Cancer  Lung cancer screening is recommended for adults 53-15 years old who are at high risk for lung cancer because of a history of smoking.  A yearly low-dose CT scan of the lungs is recommended for people who: ? Currently smoke. ? Have quit within the past 15 years. ? Have at least a 30-pack-year history of smoking. A pack year is smoking an average of one pack of cigarettes a day for 1 year.  Yearly screening should continue until it has been 15 years since you quit.  Yearly screening should stop if you develop a health problem that would prevent you from having lung cancer treatment.  Breast Cancer  Practice breast self-awareness. This means understanding how your breasts normally appear and feel.  It also means doing regular breast self-exams. Let your health care provider know about any changes, no matter how small.  If you are in your 20s or 30s, you should have a clinical breast exam (CBE) by a health care provider every 1-3 years as part of a regular health exam.  If  you are 40 or older, have a CBE every year. Also consider having a breast X-ray (mammogram) every year.  If you have a family history of breast cancer, talk to your health care provider about genetic screening.  If you are at high risk for breast cancer, talk to your health care provider about having an MRI and a mammogram every year.  Breast cancer gene (BRCA) assessment is recommended for women who have family members with BRCA-related cancers. BRCA-related cancers  include: ? Breast. ? Ovarian. ? Tubal. ? Peritoneal cancers.  Results of the assessment will determine the need for genetic counseling and BRCA1 and BRCA2 testing.  Cervical Cancer Your health care provider may recommend that you be screened regularly for cancer of the pelvic organs (ovaries, uterus, and vagina). This screening involves a pelvic examination, including checking for microscopic changes to the surface of your cervix (Pap test). You may be encouraged to have this screening done every 3 years, beginning at age 34.  For women ages 5-65, health care providers may recommend pelvic exams and Pap testing every 3 years, or they may recommend the Pap and pelvic exam, combined with testing for human papilloma virus (HPV), every 5 years. Some types of HPV increase your risk of cervical cancer. Testing for HPV may also be done on women of any age with unclear Pap test results.  Other health care providers may not recommend any screening for nonpregnant women who are considered low risk for pelvic cancer and who do not have symptoms. Ask your health care provider if a screening pelvic exam is right for you.  If you have had past treatment for cervical cancer or a condition that could lead to cancer, you need Pap tests and screening for cancer for at least 20 years after your treatment. If Pap tests have been discontinued, your risk factors (such as having a new sexual partner) need to be reassessed to determine if screening should resume. Some women have medical problems that increase the chance of getting cervical cancer. In these cases, your health care provider may recommend more frequent screening and Pap tests.  Colorectal Cancer  This type of cancer can be detected and often prevented.  Routine colorectal cancer screening usually begins at 67 years of age and continues through 67 years of age.  Your health care provider may recommend screening at an earlier age if you have risk factors  for colon cancer.  Your health care provider may also recommend using home test kits to check for hidden blood in the stool.  A small camera at the end of a tube can be used to examine your colon directly (sigmoidoscopy or colonoscopy). This is done to check for the earliest forms of colorectal cancer.  Routine screening usually begins at age 70.  Direct examination of the colon should be repeated every 5-10 years through 67 years of age. However, you may need to be screened more often if early forms of precancerous polyps or small growths are found.  Skin Cancer  Check your skin from head to toe regularly.  Tell your health care provider about any new moles or changes in moles, especially if there is a change in a mole's shape or color.  Also tell your health care provider if you have a mole that is larger than the size of a pencil eraser.  Always use sunscreen. Apply sunscreen liberally and repeatedly throughout the day.  Protect yourself by wearing long sleeves, pants, a wide-brimmed hat, and sunglasses  whenever you are outside.  Heart disease, diabetes, and high blood pressure  High blood pressure causes heart disease and increases the risk of stroke. High blood pressure is more likely to develop in: ? People who have blood pressure in the high end of the normal range (130-139/85-89 mm Hg). ? People who are overweight or obese. ? People who are African American.  If you are 64-28 years of age, have your blood pressure checked every 3-5 years. If you are 58 years of age or older, have your blood pressure checked every year. You should have your blood pressure measured twice-once when you are at a hospital or clinic, and once when you are not at a hospital or clinic. Record the average of the two measurements. To check your blood pressure when you are not at a hospital or clinic, you can use: ? An automated blood pressure machine at a pharmacy. ? A home blood pressure monitor.  If  you are between 46 years and 42 years old, ask your health care provider if you should take aspirin to prevent strokes.  Have regular diabetes screenings. This involves taking a blood sample to check your fasting blood sugar level. ? If you are at a normal weight and have a low risk for diabetes, have this test once every three years after 67 years of age. ? If you are overweight and have a high risk for diabetes, consider being tested at a younger age or more often. Preventing infection Hepatitis B  If you have a higher risk for hepatitis B, you should be screened for this virus. You are considered at high risk for hepatitis B if: ? You were born in a country where hepatitis B is common. Ask your health care provider which countries are considered high risk. ? Your parents were born in a high-risk country, and you have not been immunized against hepatitis B (hepatitis B vaccine). ? You have HIV or AIDS. ? You use needles to inject street drugs. ? You live with someone who has hepatitis B. ? You have had sex with someone who has hepatitis B. ? You get hemodialysis treatment. ? You take certain medicines for conditions, including cancer, organ transplantation, and autoimmune conditions.  Hepatitis C  Blood testing is recommended for: ? Everyone born from [birth year] through 1965. ? Anyone with known risk factors for hepatitis C.  Sexually transmitted infections (STIs)  You should be screened for sexually transmitted infections (STIs) including gonorrhea and chlamydia if: ? You are sexually active and are younger than 67 years of age. ? You are older than 67 years of age and your health care provider tells you that you are at risk for this type of infection. ? Your sexual activity has changed since you were last screened and you are at an increased risk for chlamydia or gonorrhea. Ask your health care provider if you are at risk.  If you do not have HIV, but are at risk, it may be recommended  that you take a prescription medicine daily to prevent HIV infection. This is called pre-exposure prophylaxis (PrEP). You are considered at risk if: ? You are sexually active and do not regularly use condoms or know the HIV status of your partner(s). ? You take drugs by injection. ? You are sexually active with a partner who has HIV.  Talk with your health care provider about whether you are at high risk of being infected with HIV. If you choose to begin PrEP, you should  first be tested for HIV. You should then be tested every 3 months for as long as you are taking PrEP. Pregnancy  If you are premenopausal and you may become pregnant, ask your health care provider about preconception counseling.  If you may become pregnant, take 400 to 800 micrograms (mcg) of folic acid every day.  If you want to prevent pregnancy, talk to your health care provider about birth control (contraception). Osteoporosis and menopause  Osteoporosis is a disease in which the bones lose minerals and strength with aging. This can result in serious bone fractures. Your risk for osteoporosis can be identified using a bone density scan.  If you are 38 years of age or older, or if you are at risk for osteoporosis and fractures, ask your health care provider if you should be screened.  Ask your health care provider whether you should take a calcium or vitamin D supplement to lower your risk for osteoporosis.  Menopause may have certain physical symptoms and risks.  Hormone replacement therapy may reduce some of these symptoms and risks. Talk to your health care provider about whether hormone replacement therapy is right for you. Follow these instructions at home:  Schedule regular health, dental, and eye exams.  Stay current with your immunizations.  Do not use any tobacco products including cigarettes, chewing tobacco, or electronic cigarettes.  If you are pregnant, do not drink alcohol.  If you are  breastfeeding, limit how much and how often you drink alcohol.  Limit alcohol intake to no more than 1 drink per day for nonpregnant women. One drink equals 12 ounces of beer, 5 ounces of Jaidin Richison, or 1 ounces of hard liquor.  Do not use street drugs.  Do not share needles.  Ask your health care provider for help if you need support or information about quitting drugs.  Tell your health care provider if you often feel depressed.  Tell your health care provider if you have ever been abused or do not feel safe at home. This information is not intended to replace advice given to you by your health care provider. Make sure you discuss any questions you have with your health care provider. Document Released: 03/31/2011 Document Revised: 02/21/2016 Document Reviewed: 06/19/2015 Elsevier Interactive Patient Education  [employer].

## 2018-03-11 DIAGNOSIS — H524 Presbyopia: Secondary | ICD-10-CM | POA: Diagnosis not present

## 2018-03-11 DIAGNOSIS — H2513 Age-related nuclear cataract, bilateral: Secondary | ICD-10-CM | POA: Diagnosis not present

## 2018-03-11 DIAGNOSIS — H04123 Dry eye syndrome of bilateral lacrimal glands: Secondary | ICD-10-CM | POA: Diagnosis not present

## 2018-03-11 DIAGNOSIS — H52223 Regular astigmatism, bilateral: Secondary | ICD-10-CM | POA: Diagnosis not present

## 2018-03-11 DIAGNOSIS — H5203 Hypermetropia, bilateral: Secondary | ICD-10-CM | POA: Diagnosis not present

## 2018-04-09 ENCOUNTER — Ambulatory Visit: Payer: Medicare Other

## 2018-04-29 ENCOUNTER — Other Ambulatory Visit: Payer: Self-pay

## 2018-05-20 NOTE — Patient Instructions (Addendum)
  Test(s) ordered today. Your results will be released to [place] (or called to you) after review, usually within 72hours after test completion. If any changes need to be made, you will be notified at that same time.   Medications reviewed and updated.  No changes recommended at this time.  Your prescription(s) have been submitted to your pharmacy. Please take as directed and contact our office if you believe you are having problem(s) with the medication(s).   Please followup in one year

## 2018-05-20 NOTE — Progress Notes (Signed)
Subjective:    Patient ID: Brittany Myers, female    DOB: [birth year], 67 y.o.   MRN: [id number]  HPI The patient is here for follow up.  Fatigue:  She feel very fatigued at times.  She feels it is not normal.  She does not always sleep well.  She can not turn the mind off.  She lives here and has a house at [employer]. 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  She often goes to [place] due to her son in law having ALS.  She takes ativan at night a few times a month only.  She does not want to take it more.  It does help when she takes it.    Occasionally has "heart attack" symptoms.  She has had these for years.  She will be fine and she will get sudden left sided chest pain and tightness and it radiates up to the jaw.  If she stretches out it goes away.  It is very painful for a few minutes.  Dr Linna Darner diagnosed her with silent reflux - she will then start taking zantac for a while.  She only takes the zantac for a few weeks a year.  She has about two episodes a year.   Medications and allergies reviewed with patient and updated if appropriate.  Patient Active Problem List   Diagnosis Date Noted  . Hyperlipidemia 05/21/2018  . GERD (gastroesophageal reflux disease) 07/28/2016  . Hyperglycemia 07/28/2016  . Anxiety 07/28/2016  . Nonspecific abnormal electrocardiogram (ECG) (EKG) 03/11/2016  . Shingles rash 02/13/2016  . 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 Family history of ischemic heart disease 05/20/2011  . Osteopenia 05/20/2011  . 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 TINNITUS, RIGHT 12/20/2009  . COLONIC POLYPS, BENIGN 02/11/2008    Current Outpatient Medications on File Prior to Visit  Medication Sig Dispense Refill  . clobetasol cream (TEMOVATE) 5.36 % Apply 1 application topically as needed.    Marland Kitchen estradiol (ESTRACE) 0.1 MG/GM vaginal cream Place 1  Applicatorful vaginally 2 (two) times a week.     . ranitidine (ZANTAC) 150 MG tablet Take 1 tablet (150 mg total) by mouth at bedtime. 30 tablet 2   No current facility-administered medications on file prior to visit.     Past Medical History:  Diagnosis Date  . 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 Endometriosis   . Increased homocysteine (HCC)    high-normal  . Osteopenia    T score ; -2.1  @ L femoral neck in 2006  . Plantar fasciitis     Past Surgical History:  Procedure Laterality Date  . COLONOSCOPY  2013   Dr Sharlett Iles  . HEMORRHOID SURGERY    . LAPAROSCOPIC ENDOMETRIOSIS FULGURATION     5 surgeries; Dr Irven [place], [place]  . POLYPECTOMY  2001    Dr.[place]     Social History   Socioeconomic History  . Marital status: Married    Spouse name: Not on file  . Number of children: Not on file  . Years of education: Not on file  . Highest education level: Not on file  Occupational History  . Occupation: Human resources officer  Social Needs  . Financial resource strain: Not hard at all  . Food insecurity:    Worry: Never true    Inability: Never true  .  Transportation needs:    Medical: No    Non-medical: No  Tobacco Use  . Smoking status: Never Smoker  . Smokeless tobacco: Never Used  Substance and Sexual Activity  . Alcohol use: Yes    Comment:   3/ week  . Drug use: No  . Sexual activity: Yes  Lifestyle  . Physical activity:    Days per week: 3 days    Minutes per session: 30 min  . Stress: Very much  Relationships  . Social connections:    Talks on phone: More than three times a week    Gets together: More than three times a week    Attends religious service: More than 4 times per year    Active member of club or organization: Not on file    Attends meetings of clubs or organizations: Not on file    Relationship status: Married  Other Topics Concern  . Not on file  Social History Narrative   Heart healthy diet   Regular Exercise-no     Family History  Problem Relation Age of Onset  . Hyperlipidemia Brother   . Heart failure Father        heart attack @ 48 after CVA  . Stroke Father 56       hemorrhagic  after MI  . Diabetes Father   . Endometriosis Mother   . Hyperlipidemia Mother   . Osteopenia Mother   . Heart attack Maternal Grandfather 65  . Liver cancer Maternal Aunt   . Colon cancer Paternal Grandmother   . Alzheimer's disease Maternal Grandmother     Review of Systems  Constitutional: Positive for fatigue. Negative for appetite change, chills and fever.  Respiratory: Negative for cough, shortness of breath and wheezing.   Cardiovascular: Positive for chest pain (silent reflux 2/year). Negative for palpitations and leg swelling.  Gastrointestinal: Negative for abdominal pain, blood in stool, constipation, diarrhea and nausea.  Genitourinary: Negative for dysuria and hematuria.  Musculoskeletal: Positive for arthralgias (stiffness if she does not do yoga).  Neurological: Negative for light-headedness and headaches.  Psychiatric/Behavioral: Positive for sleep disturbance. Negative for dysphoric mood. The patient is nervous/anxious.        Objective:   Vitals:   05/21/18 0831  BP: 124/82  Pulse: 65  Resp: 16  Temp: 98.6 F (37 C)  SpO2: 98%   BP Readings from Last 3 Encounters:  05/21/18 124/82  03/05/18 (!) 102/52  04/07/17 110/70   Wt Readings from Last 3 Encounters:  05/21/18 129 lb (58.5 kg)  03/05/18 129 lb (58.5 kg)  04/07/17 127 lb (57.6 kg)   Body mass index is 22.14 kg/m.   Physical Exam    Constitutional: Appears well-developed and well-nourished. No distress.  HENT:  Head: Normocephalic and atraumatic.  Neck: Neck supple. No tracheal deviation present. No thyromegaly present.  No cervical lymphadenopathy Cardiovascular: Normal rate, regular rhythm and normal heart sounds.   No murmur heard. No carotid bruit .  No edema Pulmonary/Chest: Effort normal and breath sounds  normal. No respiratory distress. No has no wheezes. No rales.  Abdomen:  Soft, NT, ND Skin: Skin is warm and dry. Not diaphoretic.  Psychiatric: Normal mood and affect. Behavior is normal.      Assessment & Plan:    See Problem List for Assessment and Plan of chronic medical problems.

## 2018-05-21 ENCOUNTER — Encounter

## 2018-05-21 ENCOUNTER — Ambulatory Visit (INDEPENDENT_AMBULATORY_CARE_PROVIDER_SITE_OTHER): Payer: Medicare Other | Admitting: Internal Medicine

## 2018-05-21 ENCOUNTER — Encounter: Payer: Self-pay | Admitting: Internal Medicine

## 2018-05-21 ENCOUNTER — Other Ambulatory Visit (INDEPENDENT_AMBULATORY_CARE_PROVIDER_SITE_OTHER): Payer: Medicare Other

## 2018-05-21 VITALS — BP 124/82 | HR 65 | Temp 98.6°F | Resp 16 | Ht 64.0 in | Wt 129.0 lb

## 2018-05-21 DIAGNOSIS — R739 Hyperglycemia, unspecified: Secondary | ICD-10-CM | POA: Diagnosis not present

## 2018-05-21 DIAGNOSIS — F419 Anxiety disorder, unspecified: Secondary | ICD-10-CM | POA: Diagnosis not present

## 2018-05-21 DIAGNOSIS — E782 Mixed hyperlipidemia: Secondary | ICD-10-CM

## 2018-05-21 DIAGNOSIS — K219 Gastro-esophageal reflux disease without esophagitis: Secondary | ICD-10-CM

## 2018-05-21 DIAGNOSIS — G479 Sleep disorder, unspecified: Secondary | ICD-10-CM

## 2018-05-21 DIAGNOSIS — R5383 Other fatigue: Secondary | ICD-10-CM | POA: Insufficient documentation

## 2018-05-21 DIAGNOSIS — E785 Hyperlipidemia, unspecified: Secondary | ICD-10-CM | POA: Insufficient documentation

## 2018-05-21 LAB — HEMOGLOBIN A1C: HEMOGLOBIN A1C: 5.5 % (ref 4.6–6.5)

## 2018-05-21 LAB — COMPREHENSIVE METABOLIC PANEL
ALBUMIN: 4.4 g/dL (ref 3.5–5.2)
ALT: 20 U/L (ref 0–35)
AST: 20 U/L (ref 0–37)
Alkaline Phosphatase: 65 U/L (ref 39–117)
BILIRUBIN TOTAL: 0.9 mg/dL (ref 0.2–1.2)
BUN: 14 mg/dL (ref 6–23)
CALCIUM: 9.8 mg/dL (ref 8.4–10.5)
CHLORIDE: 103 meq/L (ref 96–112)
CO2: 29 mEq/L (ref 19–32)
CREATININE: 0.75 mg/dL (ref 0.40–1.20)
GFR: 81.89 mL/min (ref >60.00)
Glucose, Bld: 104 mg/dL — ABNORMAL HIGH (ref 70–99)
Potassium: 4.3 mEq/L (ref 3.5–5.1)
Sodium: 139 mEq/L (ref 135–145)
Total Protein: 7 g/dL (ref 6.0–8.3)

## 2018-05-21 LAB — CBC WITH DIFFERENTIAL/PLATELET
BASOS ABS: 0 10*3/uL (ref 0.0–0.1)
Basophils Relative: 0.6 % (ref 0.0–3.0)
EOS ABS: 0.2 10*3/uL (ref 0.0–0.7)
EOS PCT: 2.8 % (ref 0.0–5.0)
HCT: 44.7 % (ref 36.0–46.0)
HEMOGLOBIN: 15.5 g/dL — AB (ref 12.0–15.0)
Lymphocytes Relative: 28.9 % (ref 12.0–46.0)
Lymphs Abs: 1.6 10*3/uL (ref 0.7–4.0)
MCHC: 34.7 g/dL (ref 30.0–36.0)
MCV: 91.9 fl (ref 78.0–100.0)
MONO ABS: 0.5 10*3/uL (ref 0.1–1.0)
Monocytes Relative: 9.8 % (ref 3.0–12.0)
Neutro Abs: 3.2 10*3/uL (ref 1.4–7.7)
Neutrophils Relative %: 57.9 % (ref 43.0–77.0)
Platelets: 221 10*3/uL (ref 150.0–400.0)
RBC: 4.87 Mil/uL (ref 3.87–5.11)
RDW: 12.5 % (ref 11.5–15.5)
WBC: 5.5 10*3/uL (ref 4.0–10.5)

## 2018-05-21 LAB — LIPID PANEL
CHOL/HDL RATIO: 3
Cholesterol: 202 mg/dL — ABNORMAL HIGH (ref 0–200)
HDL: 68.5 mg/dL (ref >39.00)
LDL Cholesterol: 115 mg/dL — ABNORMAL HIGH (ref 0–99)
NONHDL: 133.16
Triglycerides: 90 mg/dL (ref 0.0–149.0)
VLDL: 18 mg/dL (ref 0.0–40.0)

## 2018-05-21 LAB — TSH: TSH: 2.97 u[IU]/mL (ref 0.35–4.50)

## 2018-05-21 MED ORDER — EPINEPHRINE 0.3 MG/0.3ML IJ SOAJ: 0.3000 mg | INTRAMUSCULAR | 2 refills | Status: DC | PRN

## 2018-05-21 MED ORDER — LORAZEPAM 0.5 MG PO TABS
ORAL_TABLET | ORAL | 2 refills | Status: DC
Start: 2018-05-21 — End: 2019-06-28

## 2018-05-21 NOTE — Assessment & Plan Note (Signed)
Has two episodes a year of some heartburn presents possibly like a heart attack.  Stretching out often relieves his symptoms. She would then take Zantac for a while after that she does not have another recurrence-typically gets to episodes a year Only takes Zantac as needed

## 2018-05-21 NOTE — Assessment & Plan Note (Signed)
Mildly elevated lipid panel in the past Has been able to control it with lifestyle Check lipid panel, CMP TSH

## 2018-05-21 NOTE — Assessment & Plan Note (Signed)
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

## 2018-05-21 NOTE — Assessment & Plan Note (Signed)
History of elevated sugar Check A1c 

## 2018-05-21 NOTE — Assessment & Plan Note (Signed)
She feels very fatigued at times Likely related to increased stress in a very busy lifestyle We will check blood work to rule out other causes including CBC, CMP, TSH

## 2018-05-21 NOTE — Assessment & Plan Note (Signed)
Not ideally controlled She is a lot of stress from work and her son-in-law being sick Denies depression Anxiety is affecting her sleep, energy level, positive heartburn She would like to avoid a daily medication She takes lorazepam as needed for sleep, but does not take it often Advised that she can take it a little bit more often so she is getting good sleep at least Continue regular exercise and other stress management techniques

## 2018-05-22 ENCOUNTER — Encounter: Payer: Self-pay | Admitting: Internal Medicine

## 2018-05-28 DIAGNOSIS — L900 Lichen sclerosus et atrophicus: Secondary | ICD-10-CM | POA: Diagnosis not present

## 2018-05-28 DIAGNOSIS — Z779 Other contact with and (suspected) exposures hazardous to health: Secondary | ICD-10-CM | POA: Diagnosis not present

## 2018-05-28 DIAGNOSIS — N952 Postmenopausal atrophic vaginitis: Secondary | ICD-10-CM | POA: Diagnosis not present

## 2018-05-28 DIAGNOSIS — Z01419 Encounter for gynecological examination (general) (routine) without abnormal findings: Secondary | ICD-10-CM | POA: Diagnosis not present

## 2018-06-23 NOTE — Progress Notes (Signed)
Subjective:    Patient ID: Brittany Myers, female    DOB: [birth year], 67 y.o.   MRN: [id number]  HPI The patient is here for an acute visit.   2 weeks ago she had severe pain on right side of her ribs. She had been coughing and thought that was the cause.  It felt like a charlie horse.  She could not take a deep breath.  She thoguht she cracked something.  She could not sleep. She did yoga in the morning and it went away. She had two other episodes - one last week and one two nights ago.  She denies any unusual activities.  She does some yoga, but has not been going to the gym to doing anything too strenuous.  She is worried about the cause.   The pain starts in the right lower posterior- lateral ribs and radiates around to front.  She does not have any pain now, except some point tenderness on the posterior lateral lower ribs.     Medications and allergies reviewed with patient and updated if appropriate.  Patient Active Problem List   Diagnosis Date Noted  . Hyperlipidemia 05/21/2018  . Fatigue 05/21/2018  . GERD (gastroesophageal reflux disease) 07/28/2016  . Hyperglycemia 07/28/2016  . Anxiety 07/28/2016  . Nonspecific abnormal electrocardiogram (ECG) (EKG) 03/11/2016  . Shingles rash 02/13/2016  . 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 Family history of ischemic heart disease 05/20/2011  . Osteopenia 05/20/2011  . 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 TINNITUS, RIGHT 12/20/2009  . COLONIC POLYPS, BENIGN 02/11/2008    Current Outpatient Medications on File Prior to Visit  Medication Sig Dispense Refill  . clobetasol cream (TEMOVATE) 3.71 % Apply 1 application topically as needed.    Marland Kitchen EPINEPHrine 0.3 mg/0.3 mL IJ SOAJ injection Inject 0.3 mLs (0.3 mg total) into the muscle as needed. 2 Device 2  . estradiol (ESTRACE) 0.1 MG/GM vaginal cream Place 1 Applicatorful vaginally 2 (two) times a week.      Marland Kitchen LORazepam (ATIVAN) 0.5 MG tablet take 1 tablet by mouth every 8 hours if needed for anxiety 30 tablet 2  . ranitidine (ZANTAC) 150 MG tablet Take 1 tablet (150 mg total) by mouth at bedtime. 30 tablet 2   No current facility-administered medications on file prior to visit.     Past Medical History:  Diagnosis Date  . 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 Endometriosis   . Increased homocysteine (HCC)    high-normal  . Osteopenia    T score ; -2.1  @ L femoral neck in 2006  . Plantar fasciitis     Past Surgical History:  Procedure Laterality Date  . COLONOSCOPY  2013   Dr Sharlett Iles  . HEMORRHOID SURGERY    . LAPAROSCOPIC ENDOMETRIOSIS FULGURATION     5 surgeries; Dr Irven [place], Concha [place]  . POLYPECTOMY  2001    Dr.Sam [place]     Social History   Socioeconomic History  . Marital status: Married    Spouse name: Not on file  . Number of children: Not on file  . Years of education: Not on file  . Highest education level: Not on file  Occupational History  . Occupation: Human resources officer  Social Needs  . Financial resource strain: Not hard at all  . Food insecurity:    Worry: Never true    Inability: Never true  . Transportation  needs:    Medical: No    Non-medical: No  Tobacco Use  . Smoking status: Never Smoker  . Smokeless tobacco: Never Used  Substance and Sexual Activity  . Alcohol use: Yes    Comment:   3/ week  . Drug use: No  . Sexual activity: Yes  Lifestyle  . Physical activity:    Days per week: 3 days    Minutes per session: 30 min  . Stress: Very much  Relationships  . Social connections:    Talks on phone: More than three times a week    Gets together: More than three times a week    Attends religious service: More than 4 times per year    Active member of club or organization: Not on file    Attends meetings of clubs or organizations: Not on file    Relationship status: Married  Other Topics Concern  . Not on file  Social History  Narrative   Heart healthy diet   Regular Exercise-no    Family History  Problem Relation Age of Onset  . Hyperlipidemia Brother   . Heart failure Father        heart attack @ 2 after CVA  . Stroke Father 17       hemorrhagic  after MI  . Diabetes Father   . Endometriosis Mother   . Hyperlipidemia Mother   . Osteopenia Mother   . Heart attack Maternal Grandfather 65  . Liver cancer Maternal Aunt   . Colon cancer Paternal Grandmother   . Alzheimer's disease Maternal Grandmother     Review of Systems  Constitutional: Negative for chills and fever.  Respiratory: Negative for cough, shortness of breath and wheezing.   Cardiovascular: Negative for chest pain.       Objective:   Vitals:   06/24/18 1015  BP: 122/82  Pulse: 63  Resp: 16  Temp: 98.3 F (36.8 C)  SpO2: 96%   BP Readings from Last 3 Encounters:  06/24/18 122/82  05/21/18 124/82  03/05/18 (!) 102/52   Wt Readings from Last 3 Encounters:  06/24/18 128 lb 12.8 oz (58.4 kg)  05/21/18 129 lb (58.5 kg)  03/05/18 129 lb (58.5 kg)   Body mass index is 22.11 kg/m.   Physical Exam  Constitutional: She appears well-developed and well-nourished. No distress.  HENT:  Head: Normocephalic and atraumatic.  Cardiovascular: Normal rate and regular rhythm.  Pulmonary/Chest: Effort normal and breath sounds normal. No respiratory distress. She has no wheezes. She has no rales.  Musculoskeletal:  Right lower ribs - tenderness lateral-posterior ribs  - one area of tenderness, no other tenderness, no deformity  Skin: She is not diaphoretic.           Assessment & Plan:    See Problem List for Assessment and Plan of chronic medical problems.

## 2018-06-24 ENCOUNTER — Ambulatory Visit (INDEPENDENT_AMBULATORY_CARE_PROVIDER_SITE_OTHER): Payer: Medicare Other | Admitting: Internal Medicine

## 2018-06-24 ENCOUNTER — Ambulatory Visit (INDEPENDENT_AMBULATORY_CARE_PROVIDER_SITE_OTHER)
Admission: RE | Admit: 2018-06-24 | Discharge: 2018-06-24 | Disposition: A | Discharge status: Home / Self Care | Payer: Medicare Other | Source: Ambulatory Visit | Attending: Internal Medicine | Admitting: Internal Medicine

## 2018-06-24 ENCOUNTER — Encounter: Payer: Self-pay | Admitting: Internal Medicine

## 2018-06-24 VITALS — BP 122/82 | HR 63 | Temp 98.3°F | Resp 16 | Ht 64.0 in | Wt 128.8 lb

## 2018-06-24 DIAGNOSIS — R0781 Pleurodynia: Secondary | ICD-10-CM | POA: Insufficient documentation

## 2018-06-24 DIAGNOSIS — J449 Chronic obstructive pulmonary disease, unspecified: Secondary | ICD-10-CM | POA: Diagnosis not present

## 2018-06-24 DIAGNOSIS — Z23 Encounter for immunization: Secondary | ICD-10-CM

## 2018-06-24 NOTE — Assessment & Plan Note (Signed)
Area of tenderness - no deformity Doubt fracture Pain sounds like muscle spasms -? Cause No obvious need for an adjustment Will check rib/cxr  Can see sports med if symptoms continue

## 2018-06-24 NOTE — Patient Instructions (Signed)
Have an x-ray today and we will call you with the results.

## 2018-06-25 ENCOUNTER — Encounter: Payer: Self-pay | Admitting: Internal Medicine

## 2018-07-06 DIAGNOSIS — D485 Neoplasm of uncertain behavior of skin: Secondary | ICD-10-CM | POA: Diagnosis not present

## 2018-07-06 DIAGNOSIS — L245 Irritant contact dermatitis due to other chemical products: Secondary | ICD-10-CM | POA: Diagnosis not present

## 2018-07-06 DIAGNOSIS — L570 Actinic keratosis: Secondary | ICD-10-CM | POA: Diagnosis not present

## 2018-07-06 DIAGNOSIS — L430 Hypertrophic lichen planus: Secondary | ICD-10-CM | POA: Diagnosis not present

## 2018-07-06 DIAGNOSIS — Z85828 Personal history of other malignant neoplasm of skin: Secondary | ICD-10-CM | POA: Diagnosis not present

## 2018-07-06 DIAGNOSIS — L820 Inflamed seborrheic keratosis: Secondary | ICD-10-CM | POA: Diagnosis not present

## 2018-08-12 DIAGNOSIS — L570 Actinic keratosis: Secondary | ICD-10-CM | POA: Diagnosis not present

## 2018-08-12 DIAGNOSIS — D225 Melanocytic nevi of trunk: Secondary | ICD-10-CM | POA: Diagnosis not present

## 2018-08-12 DIAGNOSIS — D1801 Hemangioma of skin and subcutaneous tissue: Secondary | ICD-10-CM | POA: Diagnosis not present

## 2018-08-12 DIAGNOSIS — L812 Freckles: Secondary | ICD-10-CM | POA: Diagnosis not present

## 2018-08-12 DIAGNOSIS — Z85828 Personal history of other malignant neoplasm of skin: Secondary | ICD-10-CM | POA: Diagnosis not present

## 2018-08-12 DIAGNOSIS — D2272 Melanocytic nevi of left lower limb, including hip: Secondary | ICD-10-CM | POA: Diagnosis not present

## 2018-08-12 DIAGNOSIS — L821 Other seborrheic keratosis: Secondary | ICD-10-CM | POA: Diagnosis not present

## 2018-08-16 DIAGNOSIS — H16143 Punctate keratitis, bilateral: Secondary | ICD-10-CM | POA: Diagnosis not present

## 2018-08-16 DIAGNOSIS — H5713 Ocular pain, bilateral: Secondary | ICD-10-CM | POA: Diagnosis not present

## 2018-10-06 ENCOUNTER — Ambulatory Visit (INDEPENDENT_AMBULATORY_CARE_PROVIDER_SITE_OTHER): Payer: Medicare Other | Admitting: Internal Medicine

## 2018-10-06 ENCOUNTER — Encounter: Payer: Self-pay | Admitting: Internal Medicine

## 2018-10-06 VITALS — BP 104/64 | HR 90 | Temp 97.8°F | Resp 16 | Ht 64.0 in | Wt 132.0 lb

## 2018-10-06 DIAGNOSIS — J069 Acute upper respiratory infection, unspecified: Secondary | ICD-10-CM | POA: Insufficient documentation

## 2018-10-06 DIAGNOSIS — B9789 Other viral agents as the cause of diseases classified elsewhere: Secondary | ICD-10-CM | POA: Diagnosis not present

## 2018-10-06 MED ORDER — HYDROCODONE-HOMATROPINE 5-1.5 MG/5ML PO SYRP: 5.0000 mL | ORAL_SOLUTION | Freq: Three times a day (TID) | ORAL | 0 refills | Status: DC | PRN

## 2018-10-06 NOTE — Progress Notes (Signed)
Subjective:  Patient ID: Brittany Myers, female    DOB: 1951/06/17  Age: 68 y.o. MRN: [id number]  CC: URI   HPI Alyza Artiaga Kallen presents for a 3-day history of nonproductive cough and laryngitis.  Outpatient Medications Prior to Visit  Medication Sig Dispense Refill  . clobetasol cream (TEMOVATE) 6.20 % Apply 1 application topically as needed.    Marland Kitchen EPINEPHrine 0.3 mg/0.3 mL IJ SOAJ injection Inject 0.3 mLs (0.3 mg total) into the muscle as needed. 2 Device 2  . estradiol (ESTRACE) 0.1 MG/GM vaginal cream Place 1 Applicatorful vaginally 2 (two) times a week.     Marland Kitchen LORazepam (ATIVAN) 0.5 MG tablet take 1 tablet by mouth every 8 hours if needed for anxiety 30 tablet 2  . ranitidine (ZANTAC) 150 MG tablet Take 1 tablet (150 mg total) by mouth at bedtime. 30 tablet 2   No facility-administered medications prior to visit.     ROS Review of Systems  Constitutional: Negative for chills, diaphoresis, fatigue and fever.  HENT: Positive for voice change. Negative for facial swelling, sinus pressure, sore throat and trouble swallowing.   Respiratory: Positive for cough. Negative for chest tightness, shortness of breath and wheezing.   Cardiovascular: Negative for chest pain, palpitations and leg swelling.  Gastrointestinal: Negative for abdominal pain, diarrhea, nausea and vomiting.  Genitourinary: Negative.  Negative for difficulty urinating.  Musculoskeletal: Negative.  Negative for arthralgias and myalgias.  Skin: Negative.  Negative for rash.  Neurological: Negative.  Negative for headaches.  Hematological: Negative for adenopathy. Does not bruise/bleed easily.  Psychiatric/Behavioral: Negative.     Objective:  BP 104/64   Pulse 90   Temp 97.8 F (36.6 C) (Oral)   Resp 16   Ht 5\' 4"  (1.626 m)   Wt 132 lb (59.9 kg)   SpO2 98%   BMI 22.66 kg/m   BP Readings from Last 3 Encounters:  10/06/18 104/64  06/24/18 122/82  05/21/18 124/82    Wt Readings from Last 3 Encounters:    10/06/18 132 lb (59.9 kg)  06/24/18 128 lb 12.8 oz (58.4 kg)  05/21/18 129 lb (58.5 kg)    Physical Exam Constitutional:      General: She is not in acute distress.    Appearance: Normal appearance. She is not toxic-appearing or diaphoretic.  HENT:     Nose: Nose normal. No congestion or rhinorrhea.     Mouth/Throat:     Pharynx: Oropharynx is clear. No oropharyngeal exudate or posterior oropharyngeal erythema.  Eyes:     General: No scleral icterus.    Conjunctiva/sclera: Conjunctivae normal.  Neck:     Musculoskeletal: Normal range of motion and neck supple.  Cardiovascular:     Rate and Rhythm: Normal rate and regular rhythm.     Heart sounds: No murmur. No gallop.   Pulmonary:     Effort: Pulmonary effort is normal.     Breath sounds: Normal breath sounds. No stridor. No wheezing, rhonchi or rales.  Abdominal:     General: Bowel sounds are normal.     Palpations: There is no hepatomegaly, splenomegaly or mass.     Tenderness: There is no abdominal tenderness.  Musculoskeletal: Normal range of motion.        General: No swelling.     Right lower leg: No edema.     Left lower leg: No edema.  Lymphadenopathy:     Cervical: No cervical adenopathy.  Skin:    General: Skin is warm and dry.  Findings: No erythema or rash.  Neurological:     General: No focal deficit present.     Mental Status: She is oriented to person, place, and time. Mental status is at baseline.     Lab Results  Component Value Date   WBC 5.5 05/21/2018   HGB 15.5 (H) 05/21/2018   HCT 44.7 05/21/2018   PLT 221.0 05/21/2018   GLUCOSE 104 (H) 05/21/2018   CHOL 202 (H) 05/21/2018   TRIG 90.0 05/21/2018   HDL 68.50 05/21/2018   LDLDIRECT 116.2 05/20/2012   LDLCALC 115 (H) 05/21/2018   ALT 20 05/21/2018   AST 20 05/21/2018   NA 139 05/21/2018   K 4.3 05/21/2018   CL 103 05/21/2018   CREATININE 0.75 05/21/2018   BUN 14 05/21/2018   CO2 29 05/21/2018   TSH 2.97 05/21/2018   HGBA1C 5.5  05/21/2018    Dg Ribs Unilateral W/chest Right  Result Date: 06/24/2018 CLINICAL DATA:  Right posterior lower rib pain and cough for the past 3 weeks. EXAM: RIGHT RIBS AND CHEST - 3+ VIEW COMPARISON:  12/06/2013. FINDINGS: Normal sized heart. Clear lungs. The lungs remain hyperexpanded with stable mild diffuse prominence of the interstitial markings. 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 No rib fracture or pneumothorax seen. IMPRESSION: 1. No rib fracture or pneumothorax. 2. Stable changes of COPD. Electronically Signed   By: Claudie Revering M.D.   On: 06/24/2018 15:52    Assessment & Plan:   Ragen was seen today for uri.  Diagnoses and all orders for this visit:  Viral URI with cough- Based on her symptoms and exam she has a viral URI that does not require antibacterial therapy.  I have asked her to rest her voice and drink plenty of liquids and to take Hycodan as needed for her symptoms. -     Discontinue: HYDROcodone-homatropine (HYCODAN) 5-1.5 MG/5ML syrup; Take 5 mLs by mouth every 8 (eight) hours as needed for cough. -     HYDROcodone-homatropine (HYCODAN) 5-1.5 MG/5ML syrup; Take 5 mLs by mouth every 8 (eight) hours as needed for cough.   I am having Rosene A. Arrellano maintain her estradiol, clobetasol cream, ranitidine, EPINEPHrine, LORazepam, and HYDROcodone-homatropine.  Meds ordered this encounter  Medications  . DISCONTD: HYDROcodone-homatropine (HYCODAN) 5-1.5 MG/5ML syrup    Sig: Take 5 mLs by mouth every 8 (eight) hours as needed for cough.    Dispense:  120 mL    Refill:  0  . HYDROcodone-homatropine (HYCODAN) 5-1.5 MG/5ML syrup    Sig: Take 5 mLs by mouth every 8 (eight) hours as needed for cough.    Dispense:  120 mL    Refill:  0     Follow-up: Return if symptoms worsen or fail to improve.  Scarlette Calico, MD

## 2018-10-06 NOTE — Patient Instructions (Signed)
Viral Respiratory Infection A respiratory infection is an illness that affects part of the respiratory system, such as the lungs, nose, or throat. A respiratory infection that is caused by a virus is called a viral respiratory infection. Common types of viral respiratory infections include:  A cold.  The flu (influenza).  A respiratory syncytial virus (RSV) infection. What are the causes? This condition is caused by a virus. What are the signs or symptoms? Symptoms of this condition include:  A stuffy or runny nose.  Yellow or green nasal discharge.  A cough.  Sneezing.  Fatigue.  Achy muscles.  A sore throat.  Sweating or chills.  A fever.  A headache. How is this diagnosed? This condition may be diagnosed based on:  Your symptoms.  A physical exam.  Testing of nasal swabs. How is this treated? This condition may be treated with medicines, such as:  Antiviral medicine. This may shorten the length of time a person has symptoms.  Expectorants. These make it easier to cough up mucus.  Decongestant nasal sprays.  Acetaminophen or NSAIDs to relieve fever and pain. Antibiotic medicines are not prescribed for viral infections. This is because antibiotics are designed to kill bacteria. They are not effective against viruses. Follow these instructions at home:  Managing pain and congestion  Take over-the-counter and prescription medicines only as told by your health care provider.  If you have a sore throat, gargle with a salt-water mixture 3-4 times a day or as needed. To make a salt-water mixture, completely dissolve -1 tsp of salt in 1 cup of warm water.  Use nose drops made from salt water to ease congestion and soften raw skin around your nose.  Drink enough fluid to keep your urine pale yellow. This helps prevent dehydration and helps loosen up mucus. General instructions  Rest as much as possible.  Do not drink alcohol.  Do not use any products  that contain nicotine or tobacco, such as cigarettes and e-cigarettes. If you need help quitting, ask your health care provider.  Keep all follow-up visits as told by your health care provider. This is important. How is this prevented?   Get an annual flu shot. You may get the flu shot in late summer, fall, or winter. Ask your health care provider when you should get your flu shot.  Avoid exposing others to your respiratory infection. ? Stay home from work or school as told by your health care provider. ? Wash your hands with soap and water often, especially after you cough or sneeze. If soap and water are not available, use alcohol-based hand sanitizer.  Avoid contact with people who are sick during cold and flu season. This is generally fall and winter. Contact a health care provider if:  Your symptoms last for 10 days or longer.  Your symptoms get worse over time.  You have a fever.  You have severe sinus pain in your face or forehead.  The glands in your jaw or neck become very swollen. Get help right away if you:  Feel pain or pressure in your chest.  Have shortness of breath.  Faint or feel like you will faint.  Have severe and persistent vomiting.  Feel confused or disoriented. Summary  A respiratory infection is an illness that affects part of the respiratory system, such as the lungs, nose, or throat. A respiratory infection that is caused by a virus is called a viral respiratory infection.  Common types of viral respiratory infections are a   you will faint.  · Have severe and persistent vomiting.  · Feel confused or disoriented.  Summary  · A respiratory infection is an illness that affects part of the respiratory system, such as the lungs, nose, or throat. A respiratory infection that is caused by a virus is called a viral respiratory infection.  · Common types of viral respiratory infections are a cold, influenza, and respiratory syncytial virus (RSV) infection.  · Symptoms of this condition include a stuffy or runny nose, cough, sneezing, fatigue, achy muscles, sore throat, and fevers or chills.  · Antibiotic medicines are not prescribed for viral infections. This is because antibiotics are designed to kill bacteria. They are not effective against viruses.  This information is not intended to replace advice given to you by  your health care provider. Make sure you discuss any questions you have with your health care provider.  Document Released: 06/25/2005 Document Revised: 10/26/2017 Document Reviewed: 10/26/2017  Elsevier Interactive Patient Education © 2019 [employer]

## 2018-10-07 ENCOUNTER — Ambulatory Visit: Payer: Self-pay

## 2018-10-07 ENCOUNTER — Encounter: Payer: Self-pay | Admitting: Internal Medicine

## 2018-10-07 NOTE — Telephone Encounter (Signed)
Patient called in and says that Dr. Ronnald Ramp prescribed her Hydrocodone-homatropine cough syrup and it made her have nausea, diarrhea and vomiting. She says she only took 1/2 dose and got nauseated last night, then today had diarrhea and vomiting. She says all have stopped, but she still needs something for coughing. She says her husband came in at the same time and was prescribed Benzonatate 100 mg and wonders would it be ok to take one of those tonight. I advised not to take another person's medication and that I will send to Dr. Ronnald Ramp, someone will call back with his recommendation.  Reason for Disposition . Caller has NON-URGENT medication question about med that PCP prescribed and triager unable to answer question  Protocols used: MEDICATION QUESTION CALL-A-AH

## 2018-10-09 ENCOUNTER — Telehealth: Payer: Self-pay

## 2018-10-09 MED ORDER — BENZONATATE 100 MG PO CAPS: 200.0000 mg | ORAL_CAPSULE | Freq: Three times a day (TID) | ORAL | 0 refills | Status: DC | PRN

## 2018-10-09 NOTE — Telephone Encounter (Signed)
Received a telephone call during Sat clinic. Pt stated that she was seen my Dr. Ronnald Ramp for a cough/URI and was treated with hydrocodone cough syrup but it made her queasy with vomiting and diarrhea. She wanted a different medication called in. I spoke to Dr. Alain Marion and he aggree to send in [place] for the pt. I have informed her.

## 2018-10-11 NOTE — Telephone Encounter (Signed)
Pt stated that the hycodan cough syrup makes her nauseous and is requesting the benzonatate rx instead. Please advise

## 2018-10-11 NOTE — Telephone Encounter (Signed)
Please disregard this note.  Rx was sent on Saturday.

## 2018-10-12 NOTE — Progress Notes (Signed)
Subjective:    Patient ID: Brittany Myers, female    DOB: July 18, 1951, 68 y.o.   MRN: [id number]  HPI She is here for an acute visit for cold symptoms.  She was seen 1/8 by Dr Ronnald Ramp for cold symptoms.  She had symptoms for three days and her cough was dry.  She had laryngitis.  She was diagnosed with a viral URI and prescribed cough syrup.   She is not able to take the cough syrup because it caused side effects.  She has been taking [facility].  She does not cough during the day.  She coughs at night and she gags and has stress incontinence.  She has not been able to sleep well.  She has runny nose with clear mucus and laryngitis.  She denies any fevers, chills, nasal congestion, ear pain, sinus pain, shortness of breath, wheeze, lightheadedness and headaches.  Medications and allergies reviewed with patient and updated if appropriate.  Patient Active Problem List   Diagnosis Date Noted  . Viral URI with cough 10/06/2018  . Rib pain on right side 06/24/2018  . Hyperlipidemia 05/21/2018  . Fatigue 05/21/2018  . GERD (gastroesophageal reflux disease) 07/28/2016  . Hyperglycemia 07/28/2016  . Anxiety 07/28/2016  . Nonspecific abnormal electrocardiogram (ECG) (EKG) 03/11/2016  . Shingles rash 02/13/2016  . 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 Family history of ischemic heart disease 05/20/2011  . Osteopenia 05/20/2011  . 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 TINNITUS, RIGHT 12/20/2009  . COLONIC POLYPS, BENIGN 02/11/2008    Current Outpatient Medications on File Prior to Visit  Medication Sig Dispense Refill  . benzonatate (TESSALON) 100 MG capsule Take 2 capsules (200 mg total) by mouth 3 (three) times daily as needed for cough. 30 capsule 0  . clobetasol cream (TEMOVATE) 8.10 % Apply 1 application topically as needed.    Marland Kitchen EPINEPHrine 0.3 mg/0.3 mL IJ SOAJ injection Inject 0.3 mLs (0.3 mg total)  into the muscle as needed. 2 Device 2  . estradiol (ESTRACE) 0.1 MG/GM vaginal cream Place 1 Applicatorful vaginally 2 (two) times a week.     Marland Kitchen LORazepam (ATIVAN) 0.5 MG tablet take 1 tablet by mouth every 8 hours if needed for anxiety 30 tablet 2   No current facility-administered medications on file prior to visit.     Past Medical History:  Diagnosis Date  . 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 Endometriosis   . Increased homocysteine (HCC)    high-normal  . Osteopenia    T score ; -2.1  @ L femoral neck in 2006  . Plantar fasciitis     Past Surgical History:  Procedure Laterality Date  . COLONOSCOPY  2013   Dr Sharlett Iles  . HEMORRHOID SURGERY    . LAPAROSCOPIC ENDOMETRIOSIS FULGURATION     5 surgeries; Dr Irven [place], [place]  . POLYPECTOMY  2001    Dr.Sam Harrod     Social History   Socioeconomic History  . Marital status: Married    Spouse name: Not on file  . Number of children: Not on file  . Years of education: Not on file  . Highest education level: Not on file  Occupational History  . Occupation: Human resources officer  Social Needs  . Financial resource strain: Not hard at all  . Food insecurity:    Worry: Never true    Inability: Never true  . Transportation needs:  Medical: No    Non-medical: No  Tobacco Use  . Smoking status: Never Smoker  . Smokeless tobacco: Never Used  Substance and Sexual Activity  . Alcohol use: Yes    Comment:   3/ week  . Drug use: No  . Sexual activity: Yes  Lifestyle  . Physical activity:    Days per week: 3 days    Minutes per session: 30 min  . Stress: Very much  Relationships  . Social connections:    Talks on phone: More than three times a week    Gets together: More than three times a week    Attends religious service: More than 4 times per year    Active member of club or organization: Not on file    Attends meetings of clubs or organizations: Not on file    Relationship status: Married  Other Topics  Concern  . Not on file  Social History Narrative   Heart healthy diet   Regular Exercise-no    Family History  Problem Relation Age of Onset  . Hyperlipidemia Brother   . Heart failure Father        heart attack @ 75 after CVA  . Stroke Father 75       hemorrhagic  after MI  . Diabetes Father   . Endometriosis Mother   . Hyperlipidemia Mother   . Osteopenia Mother   . Heart attack Maternal Grandfather 65  . Liver cancer Maternal Aunt   . Colon cancer Paternal Grandmother   . Alzheimer's disease Maternal Grandmother     Review of Systems  Constitutional: Negative for chills and fever.  HENT: Positive for rhinorrhea (clear) and voice change. Negative for congestion, ear pain and sinus pain.   Respiratory: Positive for cough (more at night, dry - starting to bring sputum up). Negative for shortness of breath and wheezing.   Neurological: Negative for light-headedness and headaches.       Objective:   Vitals:   10/13/18 1040  BP: 134/78  Pulse: 94  Resp: 16  Temp: 98.9 F (37.2 C)  SpO2: 95%   Filed Weights   10/13/18 1040  Weight: 129 lb (58.5 kg)   Body mass index is 22.14 kg/m.  Wt Readings from Last 3 Encounters:  10/13/18 129 lb (58.5 kg)  10/06/18 132 lb (59.9 kg)  06/24/18 128 lb 12.8 oz (58.4 kg)     Physical Exam GENERAL APPEARANCE: Appears stated age, well appearing, NAD EYES: conjunctiva clear, no icterus HEENT: bilateral tympanic membranes and ear canals normal, oropharynx with no erythema, no thyromegaly, trachea midline, no cervical or supraclavicular lymphadenopathy LUNGS: Clear to auscultation without wheeze or crackles, unlabored breathing, good air entry bilaterally CARDIOVASCULAR: Normal S1,S2 without murmurs, no edema SKIN: warm, dry        Assessment & Plan:   See Problem List for Assessment and Plan of chronic medical problems.

## 2018-10-13 ENCOUNTER — Other Ambulatory Visit: Payer: Self-pay | Admitting: Emergency Medicine

## 2018-10-13 ENCOUNTER — Ambulatory Visit (INDEPENDENT_AMBULATORY_CARE_PROVIDER_SITE_OTHER): Payer: Medicare Other | Admitting: Internal Medicine

## 2018-10-13 ENCOUNTER — Encounter: Payer: Self-pay | Admitting: Internal Medicine

## 2018-10-13 VITALS — BP 134/78 | HR 94 | Temp 98.9°F | Resp 16 | Ht 64.0 in | Wt 129.0 lb

## 2018-10-13 DIAGNOSIS — R05 Cough: Secondary | ICD-10-CM | POA: Diagnosis not present

## 2018-10-13 DIAGNOSIS — R059 Cough, unspecified: Secondary | ICD-10-CM | POA: Insufficient documentation

## 2018-10-13 MED ORDER — PROMETHAZINE-DM 6.25-15 MG/5ML PO SYRP: 5.0000 mL | ORAL_SOLUTION | Freq: Four times a day (QID) | ORAL | 0 refills | Status: DC | PRN

## 2018-10-13 MED ORDER — FLUTICASONE PROPIONATE 50 MCG/ACT NA SUSP: 2.0000 | Freq: Every day | NASAL | 0 refills | Status: DC

## 2018-10-13 MED ORDER — BENZONATATE 100 MG PO CAPS: 200.0000 mg | ORAL_CAPSULE | Freq: Three times a day (TID) | ORAL | 1 refills | Status: DC | PRN

## 2018-10-13 NOTE — Patient Instructions (Signed)
Take the lorazepam at night with the cough syrup.   Take tessalon perles of the cough syrup during the day.    Try a nasal spray, flonase, saline nasal spray, and an allergy medication to help the decrease the drainage.    Call if no improvement

## 2018-10-13 NOTE — Assessment & Plan Note (Signed)
Cough is likely viral in nature No need for antibiotic Continue Tessalon Perles-renewed Can try promethazine-dextromethorphan cough syrup Start Flonase, Allegra or Claritin and saline nasal spray Okay to take lorazepam at night, which will help her sleep Call if no improvement

## 2018-12-31 ENCOUNTER — Encounter: Payer: Self-pay | Admitting: Internal Medicine

## 2019-02-02 ENCOUNTER — Encounter: Payer: Self-pay | Admitting: Internal Medicine

## 2019-03-10 ENCOUNTER — Ambulatory Visit: Payer: Self-pay | Admitting: *Deleted

## 2019-03-10 NOTE — Telephone Encounter (Signed)
Scheduled pt with Raeford Razor for 6/12

## 2019-03-10 NOTE — Telephone Encounter (Signed)
Pt called stating that she fell on 03/09/2019; she slipped on a rug; she complains of right shoulder pain, and her ring and pink fingers were tingling last night, and are "kinda numb" today; she unable to brush her hair due to her shoulder; her pain is rated 5 out of 10; recommendations made per nurse triage protocol; the pt verbalized understanding; pt transferred to [place] at [facility] for scheduling; she normally sees Dr Billey Gosling; will route to office for notification.   Reason for Disposition . Can't move injured shoulder normally (e.g., full range of motion, able to touch top of head)  Answer Assessment - Initial Assessment Questions 1. MECHANISM: "How did the injury happen?"     Pt slipped on rug 2. ONSET: "When did the injury happen?" (Minutes or hours ago)     03/09/2019 3. APPEARANCE of INJURY: "What does the injury look like?"     No broken skin, no bruises 4. SEVERITY: "Can you move the shoulder normally?"     no 5. SIZE: For cuts, bruises, or swelling, ask: "How large is it?" (e.g., inches or centimeters;  entire joint)     no 6. PAIN: "Is there pain?" If so, ask: "How bad is the pain?"   (e.g., Scale 1-10; or mild, moderate, severe)     5 out of 10 7. TETANUS: For any breaks in the skin, ask: "When was the last tetanus booster?"     n/a 8. OTHER SYMPTOMS: "Do you have any other symptoms?" (e.g., loss of sensation)     Fingers are numb; bruise on hip 9. PREGNANCY: "Is there any chance you are pregnant?" "When was your last menstrual period?"     no  Protocols used: SHOULDER INJURY-A-AH

## 2019-03-11 ENCOUNTER — Other Ambulatory Visit: Payer: Self-pay

## 2019-03-11 ENCOUNTER — Ambulatory Visit (INDEPENDENT_AMBULATORY_CARE_PROVIDER_SITE_OTHER)
Admission: RE | Admit: 2019-03-11 | Discharge: 2019-03-11 | Disposition: A | Discharge status: Home / Self Care | Payer: Medicare Other | Source: Ambulatory Visit | Attending: Family Medicine | Admitting: Family Medicine

## 2019-03-11 ENCOUNTER — Ambulatory Visit (INDEPENDENT_AMBULATORY_CARE_PROVIDER_SITE_OTHER): Payer: Medicare Other | Admitting: Family Medicine

## 2019-03-11 ENCOUNTER — Encounter: Payer: Self-pay | Admitting: Family Medicine

## 2019-03-11 ENCOUNTER — Ambulatory Visit: Payer: Self-pay

## 2019-03-11 VITALS — BP 106/68 | HR 79 | Ht 64.0 in | Wt 126.4 lb

## 2019-03-11 DIAGNOSIS — S4991XA Unspecified injury of right shoulder and upper arm, initial encounter: Secondary | ICD-10-CM | POA: Diagnosis not present

## 2019-03-11 DIAGNOSIS — M25562 Pain in left knee: Secondary | ICD-10-CM | POA: Insufficient documentation

## 2019-03-11 DIAGNOSIS — M25511 Pain in right shoulder: Secondary | ICD-10-CM

## 2019-03-11 NOTE — Assessment & Plan Note (Signed)
Had a fall onto her arm.  Has good motion and strength today.  Has a history of osteopenia. -Counseled on home exercise therapy and supportive care. - X-ray.

## 2019-03-11 NOTE — Assessment & Plan Note (Signed)
No structural changes observed on ultrasound.  Does not appear to be associated with patellar tendon.  Could be nerve irritation.  She notices it with specific movements and yoga. -Counseled on supportive care. -If no improvement he consider imaging or nitro patches.

## 2019-03-11 NOTE — Progress Notes (Addendum)
Brittany Myers - 67 y.o. female MRN [id number]  Date of birth: [birth year]  SUBJECTIVE:  Including CC & ROS.  Chief Complaint  Patient presents with  . Initial Assessment    R shoulder pain   I, Brittany Myers, LAT, ATC am serving as scribe for Brittany Myers.  MALEEHA Myers is a 68 y.o. female that is c/o R shoulder pain w/ R hand (4th and 5th fingers) N/T.  She states that she slipped on a rug and fell on an outstretched R arm/hand.  Pt reports that she had immediate pain and continues to have pain.  However, her pain has decreased over the past 2 days but notes that she had a hard time using her R arm yesterday to brush her hair.  She states she has been using Tylenol and reports intermittent icing of her R shoulder.  She rates her pain as a 2/10 aching pain.  She is also c/o L ant knee pain x 2-3 weeks.  She reports burning pain in the L ant knee just lateral to the patellar tendon and tibial tuberosity.  Denies any specific inciting event.  Is intermittent in nature.  It is localized to this area.  She feels it when the area is touched.  Denies any history of surgery.  Aggravating factors: R shoulder overhead motion and lifting her purse  Alleviating factors: Tylenol and ice     Review of Systems  Constitutional: Negative for fever.  HENT: Negative for congestion.   Respiratory: Negative for cough.   Cardiovascular: Negative for chest pain.  Gastrointestinal: Negative for abdominal pain.  Musculoskeletal: Negative for gait problem.  Skin: Positive for color change.  Neurological: Negative for weakness.  Hematological: Negative for adenopathy.    HISTORY: Past Medical, Surgical, Social, and Family History Reviewed & Updated per EMR.   Pertinent Historical Findings include:  Past Medical History:  Diagnosis Date  . 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 Endometriosis   . Increased homocysteine (HCC)    high-normal  . Osteopenia    T score ; -2.1  @ L femoral neck in 2006  . Plantar  fasciitis     Past Surgical History:  Procedure Laterality Date  . COLONOSCOPY  2013   Brittany Myers  . HEMORRHOID SURGERY    . LAPAROSCOPIC ENDOMETRIOSIS FULGURATION     5 surgeries; Brittany Myers, Brittany Myers  . POLYPECTOMY  2001    Brittany Myers     Allergies  Allergen Reactions  . Ciprofloxacin     REACTION: red ,itchy feet, "furry throat" Because of a history of documented adverse serious drug reaction;Medi Alert bracelet  is recommended  . Penicillins     REACTION: angioedema Because of a history of documented adverse serious drug reaction;Medi Alert bracelet  is recommended  . Terbinafine Hcl     Angioedema & shock 1999 from oral Lamisil  Because of a history of documented adverse serious drug reaction;Medi Alert bracelet  is recommended  . Erythromycin     REACTION: headaches  . Minocycline Hcl     REACTION: photosensitivity post sun exposure  . Zolpidem Tartrate     REACTION: "weepy"    Family History  Problem Relation Age of Onset  . Hyperlipidemia Brother   . Heart failure Father        heart attack @ 16 after CVA  . Stroke Father 60       hemorrhagic  after MI  . Diabetes Father   . Endometriosis  Mother   . Hyperlipidemia Mother   . Osteopenia Mother   . Heart attack Maternal Grandfather 65  . Liver cancer Maternal Aunt   . Colon cancer Paternal Grandmother   . Alzheimer's disease Maternal Grandmother      Social History   Socioeconomic History  . Marital status: Married    Spouse name: Not on file  . Number of children: Not on file  . Years of education: Not on file  . Highest education level: Not on file  Occupational History  . Occupation: Human resources officer  Social Needs  . Financial resource strain: Not hard at all  . Food insecurity    Worry: Never true    Inability: Never true  . Transportation needs    Medical: No    Non-medical: No  Tobacco Use  . Smoking status: Never Smoker  . Smokeless tobacco: Never Used   Substance and Sexual Activity  . Alcohol use: Yes    Comment:   3/ week  . Drug use: No  . Sexual activity: Yes  Lifestyle  . Physical activity    Days per week: 3 days    Minutes per session: 30 min  . Stress: Very much  Relationships  . Social connections    Talks on phone: More than three times a week    Gets together: More than three times a week    Attends religious service: More than 4 times per year    Active member of club or organization: Not on file    Attends meetings of clubs or organizations: Not on file    Relationship status: Married  . Intimate partner violence    Fear of current or ex partner: No    Emotionally abused: No    Physically abused: No    Forced sexual activity: No  Other Topics Concern  . Not on file  Social History Narrative   Heart healthy diet   Regular Exercise-no     PHYSICAL EXAM:  VS: BP 106/68 (BP Location: Left Arm, Patient Position: Sitting, Cuff Size: Normal)   Pulse 79   Ht 5\' 4"  (1.626 m)   Wt 126 lb 6.4 oz (57.3 kg)   SpO2 97%   BMI 21.70 kg/m  Physical Exam Gen: NAD, alert, cooperative with exam, well-appearing ENT: normal lips, normal nasal mucosa,  Eye: normal EOM, normal conjunctiva and lids CV:  no edema, +2 pedal pulses   Resp: no accessory muscle use, non-labored,  Skin: no rashes, no areas of induration  Neuro: normal tone, normal sensation to touch Psych:  normal insight, alert and oriented MSK:  Right shoulder: Normal range of motion. Normal strength resistance. Normal external rotation. Normal strength resistance with internal and external rotation. Normal empty can testing. Normal grip strength. Left knee. No effusion. Tenderness palpation just lateral to the insertion of the patellar tendon on the proximal tibia. Normal range of motion Normal strength resistance. No instability. No redness or swelling. Neurovascular intact  Limited ultrasound: Left knee:  Normal-appearing patellar tendon.  Normal-appearing proximal anterior tibia. No joint effusion. No significant loss of the lateral joint line.  Summary: No specific identifying source to her pain.  Ultrasound and interpretation by Brittany Coots, MD      ASSESSMENT & PLAN:   Acute pain of left knee No structural changes observed on ultrasound.  Does not appear to be associated with patellar tendon.  Could be nerve irritation.  She notices it with specific movements and yoga. -Counseled on supportive  care. -If no improvement he consider imaging or nitro patches.  Acute pain of right shoulder Had a fall onto her arm.  Has good motion and strength today.  Has a history of osteopenia. -Counseled on home exercise therapy and supportive care. - X-ray.  The above documentation has been reviewed and is accurate and complete. Brittany Coots, MD 03/11/2019, 2:08 PM>

## 2019-03-11 NOTE — Patient Instructions (Signed)
Nice to meet you Please try voltaren gel  Please use ice  Please continue with yoga  I will call you with the results from today   Please send me a message in [place] with any questions or updates.  Please see me back in 4 weeks if needed.   --Dr. Raeford Razor

## 2019-03-15 ENCOUNTER — Telehealth: Payer: Self-pay | Admitting: Family Medicine

## 2019-03-15 NOTE — Telephone Encounter (Signed)
Left VM for patient. If she calls back please have her speak with a nurse/CMA and inform that her results are normal. The [facility] can report results to patient.   If any questions then please take the best time and phone number to call and I will try to call her back.   Rosemarie Ax, MD [facility] 03/15/2019, 8:29 AM

## 2019-03-30 DIAGNOSIS — H52223 Regular astigmatism, bilateral: Secondary | ICD-10-CM | POA: Diagnosis not present

## 2019-03-30 DIAGNOSIS — H04123 Dry eye syndrome of bilateral lacrimal glands: Secondary | ICD-10-CM | POA: Diagnosis not present

## 2019-03-30 DIAGNOSIS — H5203 Hypermetropia, bilateral: Secondary | ICD-10-CM | POA: Diagnosis not present

## 2019-03-30 DIAGNOSIS — H524 Presbyopia: Secondary | ICD-10-CM | POA: Diagnosis not present

## 2019-03-30 DIAGNOSIS — H2513 Age-related nuclear cataract, bilateral: Secondary | ICD-10-CM | POA: Diagnosis not present

## 2019-04-20 DIAGNOSIS — Z1231 Encounter for screening mammogram for malignant neoplasm of breast: Secondary | ICD-10-CM | POA: Diagnosis not present

## 2019-04-20 LAB — HM MAMMOGRAPHY

## 2019-05-04 ENCOUNTER — Encounter: Payer: Self-pay | Admitting: Internal Medicine

## 2019-05-12 DIAGNOSIS — M7061 Trochanteric bursitis, right hip: Secondary | ICD-10-CM | POA: Diagnosis not present

## 2019-05-12 DIAGNOSIS — M25521 Pain in right elbow: Secondary | ICD-10-CM | POA: Diagnosis not present

## 2019-05-12 DIAGNOSIS — M25551 Pain in right hip: Secondary | ICD-10-CM | POA: Diagnosis not present

## 2019-05-12 DIAGNOSIS — M67911 Unspecified disorder of synovium and tendon, right shoulder: Secondary | ICD-10-CM | POA: Diagnosis not present

## 2019-05-12 DIAGNOSIS — M25511 Pain in right shoulder: Secondary | ICD-10-CM | POA: Diagnosis not present

## 2019-06-13 DIAGNOSIS — M7541 Impingement syndrome of right shoulder: Secondary | ICD-10-CM | POA: Diagnosis not present

## 2019-06-13 DIAGNOSIS — M7061 Trochanteric bursitis, right hip: Secondary | ICD-10-CM | POA: Diagnosis not present

## 2019-06-13 DIAGNOSIS — Z9181 History of falling: Secondary | ICD-10-CM | POA: Diagnosis not present

## 2019-06-20 NOTE — Progress Notes (Addendum)
Subjective:   Brittany Myers is a 68 y.o. female who presents for Medicare Annual (Subsequent) preventive examination.  Review of Systems:   Cardiac Risk Factors include: advanced age (>33men, >74 women);dyslipidemia Sleep patterns: gets up 1 times nightly to void and sleeps hours varies nightly. Patient reports insomnia issues, discussed recommended sleep tips and stress reduction tips. Home Safety/Smoke Alarms: Feels safe in home. Smoke alarms in place.  Living environment; residence and Firearm Safety: 2-story house. Lives with husband, no needs for DME, good support system Seat Belt Safety/Bike Helmet: Wears seat belt.     Objective:     Vitals: BP 104/68   Pulse 71   Resp 17   Ht 5\' 4"  (1.626 m)   Wt 127 lb (57.6 kg)   SpO2 99%   BMI 21.80 kg/m   Body mass index is 21.8 kg/m.  Advanced Directives 06/21/2019 03/05/2018  Does Patient Have a Medical Advance Directive? No No  Does patient want to make changes to medical advance directive? - Yes (ED - Information included in AVS)  Would patient like information on creating a medical advance directive? Yes (ED - Information included in AVS) -    Tobacco Social History   Tobacco Use  Smoking Status Never Smoker  Smokeless Tobacco Never Used     Counseling given: Not Answered  Past Medical History:  Diagnosis Date  . 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 Endometriosis   . Increased homocysteine (HCC)    high-normal  . Osteopenia    T score ; -2.1  @ L femoral neck in 2006  . Plantar fasciitis    Past Surgical History:  Procedure Laterality Date  . COLONOSCOPY  2013   Dr Sharlett Iles  . HEMORRHOID SURGERY    . LAPAROSCOPIC ENDOMETRIOSIS FULGURATION     5 surgeries; Dr Irven [place], Concha [place]  . POLYPECTOMY  2001    Dr.Sam [place]    Family History  Problem Relation Age of Onset  . Hyperlipidemia Brother   . Heart failure Father        heart attack @ 62 after CVA  . Stroke Father 13       hemorrhagic  after MI  . Diabetes  Father   . Endometriosis Mother   . Hyperlipidemia Mother   . Osteopenia Mother   . Heart attack Maternal Grandfather 65  . Liver cancer Maternal Aunt   . Colon cancer Paternal Grandmother   . Alzheimer's disease Maternal Grandmother    Social History   Socioeconomic History  . Marital status: Married    Spouse name: Not on file  . Number of children: 0  . Years of education: Not on file  . Highest education level: Not on file  Occupational History  . Occupation: Human resources officer  Social Needs  . Financial resource strain: Not hard at all  . Food insecurity    Worry: Never true    Inability: Never true  . Transportation needs    Medical: No    Non-medical: No  Tobacco Use  . Smoking status: Never Smoker  . Smokeless tobacco: Never Used  Substance and Sexual Activity  . Alcohol use: Yes    Comment:   3/ week  . Drug use: No  . Sexual activity: Yes  Lifestyle  . Physical activity    Days per week: 5 days    Minutes per session: 50 min  . Stress: Only a little  Relationships  . Social connections  Talks on phone: More than three times a week    Gets together: More than three times a week    Attends religious service: More than 4 times per year    Active member of club or organization: Not on file    Attends meetings of clubs or organizations: Not on file    Relationship status: Married  Other Topics Concern  . Not on file  Social History Narrative   Heart healthy diet   Regular Exercise-no    Outpatient Encounter Medications as of 06/21/2019  Medication Sig  . acetaminophen (TYLENOL) 500 MG tablet Take 500 mg by mouth every 6 (six) hours as needed.  . clobetasol cream (TEMOVATE) [id number] % Apply 1 application topically as needed.  Marland Kitchen EPINEPHrine 0.3 mg/0.3 mL IJ SOAJ injection Inject 0.3 mLs (0.3 mg total) into the muscle as needed.  Marland Kitchen estradiol (ESTRACE) 0.1 MG/GM vaginal cream Place 1 Applicatorful vaginally 2 (two) times a week.   Marland Kitchen  LORazepam (ATIVAN) 0.5 MG tablet take 1 tablet by mouth every 8 hours if needed for anxiety  . [DISCONTINUED] benzonatate (TESSALON) 100 MG capsule Take 2 capsules (200 mg total) by mouth 3 (three) times daily as needed for cough. (Patient not taking: Reported on 06/21/2019)  . [DISCONTINUED] fluticasone (FLONASE) 50 MCG/ACT nasal spray Place 2 sprays into both nostrils daily. (Patient not taking: Reported on 06/21/2019)  . [DISCONTINUED] promethazine-dextromethorphan (PROMETHAZINE-DM) 6.25-15 MG/5ML syrup Take 5 mLs by mouth 4 (four) times daily as needed for cough. (Patient not taking: Reported on 06/21/2019)   No facility-administered encounter medications on file as of 06/21/2019.     Activities of Daily Living In your present state of health, do you have any difficulty performing the following activities: 06/21/2019  Hearing? N  Vision? N  Difficulty concentrating or making decisions? N  Walking or climbing stairs? N  Dressing or bathing? N  Doing errands, shopping? N  Preparing Food and eating ? N  Using the Toilet? N  In the past six months, have you accidently leaked urine? N  Do you have problems with loss of bowel control? N  Managing your Medications? N  Managing your Finances? N  Housekeeping or managing your Housekeeping? N  Some recent data might be hidden    Patient Care Team: Binnie Rail, MD as PCP - General (Internal Medicine)    Assessment:   This is a routine wellness examination for Brittany Myers. Physical assessment deferred to PCP.  Exercise Activities and Dietary recommendations Current Exercise Habits: Home exercise routine, Type of exercise: yoga;walking, Time (Minutes): 45, Frequency (Times/Week): 5, Weekly Exercise (Minutes/Week): 225, Intensity: Mild, Exercise limited by: orthopedic condition(s)  Diet (meal preparation, eat out, water intake, caffeinated beverages, dairy products, fruits and vegetables): in general, a "healthy" diet  , well balanced. eats a  variety of fruits and vegetables daily, limits salt, fat/cholesterol, sugar,carbohydrates,caffeine, drinks 6-8 glasses of water daily.    Goals    . Patient Stated     Increase my physical activity by doing more yoga and get back to church as soon as possible.       Fall Risk Fall Risk  06/21/2019 04/29/2018 03/05/2018 07/28/2016 07/19/2013  Falls in the past year? 1 No No Yes Yes  Comment - Emmi Telephone Survey: data to providers prior to load - - -  Number falls in past yr: 1 - - 1 1  Comment - - - - Fell working  Injury with Fall? 1 - - No (No Data)  Comment - - - - Left hand pain    Depression Screen PHQ 2/9 Scores 06/21/2019 03/05/2018 07/28/2016 07/19/2013  PHQ - 2 Score 1 1 0 0  PHQ- 9 Score 4 - - -     Cognitive Function       Ad8 score reviewed for issues:  Issues making decisions: no  Less interest in hobbies / activities: no  Repeats questions, stories (family complaining): no  Trouble using ordinary gadgets (microwave, computer, phone):no  Forgets the month or year: no  Mismanaging finances: no  Remembering appts: no  Daily problems with thinking and/or memory: no Ad8 score is= 0  Immunization History  Administered Date(s) Administered  . Influenza Split 07/31/2011, 07/14/2012  . Influenza Whole 07/18/2008, 10/10/2009, 06/18/2010  . Influenza, High Dose Seasonal PF 07/28/2016, 07/17/2017, 06/24/2018  . Influenza,inj,Quad PF,6+ Mos 07/20/2013, 07/20/2014, 07/24/2015  . Pneumococcal Conjugate-13 12/05/2016  . Pneumococcal Polysaccharide-23 03/05/2018  . Tdap 11/25/2006, 01/16/2017  . Zoster 03/26/2016   Screening Tests Health Maintenance  Topic Date Due  . INFLUENZA VACCINE  04/30/2019  . DEXA SCAN  09/23/2020  . MAMMOGRAM  04/19/2021  . COLONOSCOPY  03/24/2022  . TETANUS/TDAP  01/17/2027  . Hepatitis C Screening  Completed  . PNA vac Low Risk Adult  Completed      Plan:    Reviewed health maintenance screenings with patient today and  relevant education, vaccines, and/or referrals were provided.   I have personally reviewed and noted the following in the patient's chart:   . Medical and social history . Use of alcohol, tobacco or illicit drugs  . Current medications and supplements . Functional ability and status . Nutritional status . Physical activity . Advanced directives . List of other physicians . Vitals . Screenings to include cognitive, depression, and falls . Referrals and appointments  In addition, I have reviewed and discussed with patient certain preventive protocols, quality metrics, and best practice recommendations. A written personalized care plan for preventive services as well as general preventive health recommendations were provided to patient.     Michiel Cowboy, RN  06/21/2019    Medical screening examination/treatment/procedure(s) were performed by non-physician practitioner and as supervising physician I was immediately available for consultation/collaboration. I agree with above. Binnie Rail, MD

## 2019-06-21 ENCOUNTER — Ambulatory Visit (INDEPENDENT_AMBULATORY_CARE_PROVIDER_SITE_OTHER): Payer: Medicare Other | Admitting: *Deleted

## 2019-06-21 ENCOUNTER — Other Ambulatory Visit: Payer: Self-pay

## 2019-06-21 VITALS — BP 104/68 | HR 71 | Resp 17 | Ht 64.0 in | Wt 127.0 lb

## 2019-06-21 DIAGNOSIS — Z0000 Encounter for general adult medical examination without abnormal findings: Secondary | ICD-10-CM

## 2019-06-21 DIAGNOSIS — Z23 Encounter for immunization: Secondary | ICD-10-CM

## 2019-06-21 DIAGNOSIS — Z9181 History of falling: Secondary | ICD-10-CM | POA: Diagnosis not present

## 2019-06-21 DIAGNOSIS — M7541 Impingement syndrome of right shoulder: Secondary | ICD-10-CM | POA: Diagnosis not present

## 2019-06-21 DIAGNOSIS — M7061 Trochanteric bursitis, right hip: Secondary | ICD-10-CM | POA: Diagnosis not present

## 2019-06-21 NOTE — Patient Instructions (Addendum)
Continue doing brain stimulating activities (puzzles, reading, adult coloring books, staying active) to keep memory sharp.   Continue to eat heart healthy diet (full of fruits, vegetables, whole grains, lean protein, water--limit salt, fat, and sugar intake) and increase physical activity as tolerated.   Brittany Myers , Thank you for taking time to come for your Medicare Wellness Visit. I appreciate your ongoing commitment to your health goals. Please review the following plan we discussed and let me know if I can assist you in the future.   These are the goals we discussed: Goals    . Patient Stated     Increase my physical activity by doing more yoga and get back to church as soon as possible.       This is a list of the screening recommended for you and due dates:  Health Maintenance  Topic Date Due  . Flu Shot  04/30/2019  . DEXA scan (bone density measurement)  09/23/2020  . Mammogram  04/19/2021  . Colon Cancer Screening  03/24/2022  . Tetanus Vaccine  01/17/2027  .  Hepatitis C: One time screening is recommended by Center for Disease Control  (CDC) for  adults born from [birth year] through 1965.   Completed  . Pneumonia vaccines  Completed    Preventive Care 68 Years and Older, Female Preventive care refers to lifestyle choices and visits with your health care provider that can promote health and wellness. This includes:  A yearly physical exam. This is also called an annual well check.  Regular dental and eye exams.  Immunizations.  Screening for certain conditions.  Healthy lifestyle choices, such as diet and exercise. What can I expect for my preventive care visit? Physical exam Your health care provider will check:  Height and weight. These may be used to calculate body mass index (BMI), which is a measurement that tells if you are at a healthy weight.  Heart rate and blood pressure.  Your skin for abnormal spots. Counseling Your health care provider may ask you  questions about:  Alcohol, tobacco, and drug use.  Emotional well-being.  Home and relationship well-being.  Sexual activity.  Eating habits.  History of falls.  Memory and ability to understand (cognition).  Work and work Statistician.  Pregnancy and menstrual history. What immunizations do I need?  Influenza (flu) vaccine  This is recommended every year. Tetanus, diphtheria, and pertussis (Tdap) vaccine  You may need a Td booster every 10 years. Varicella (chickenpox) vaccine  You may need this vaccine if you have not already been vaccinated. Zoster (shingles) vaccine  You may need this after age 68. Pneumococcal conjugate (PCV13) vaccine  One dose is recommended after age 68. Pneumococcal polysaccharide (PPSV23) vaccine  One dose is recommended after age 68. Measles, mumps, and rubella (MMR) vaccine  You may need at least one dose of MMR if you were born in 1957 or later. You may also need a second dose. Meningococcal conjugate (MenACWY) vaccine  You may need this if you have certain conditions. Hepatitis A vaccine  You may need this if you have certain conditions or if you travel or work in places where you may be exposed to hepatitis A. Hepatitis B vaccine  You may need this if you have certain conditions or if you travel or work in places where you may be exposed to hepatitis B. Haemophilus influenzae type b (Hib) vaccine  You may need this if you have certain conditions. You may receive vaccines as individual doses  or as more than one vaccine together in one shot (combination vaccines). Talk with your health care provider about the risks and benefits of combination vaccines. What tests do I need? Blood tests  Lipid and cholesterol levels. These may be checked every 5 years, or more frequently depending on your overall health.  Hepatitis C test.  Hepatitis B test. Screening  Lung cancer screening. You may have this screening every year starting at  age 68 if you have a 30-pack-year history of smoking and currently smoke or have quit within the past 15 years.  Colorectal cancer screening. All adults should have this screening starting at age 68 and continuing until age 22. Your health care provider may recommend screening at age 68 if you are at increased risk. You will have tests every 1-10 years, depending on your results and the type of screening test.  Diabetes screening. This is done by checking your blood sugar (glucose) after you have not eaten for a while (fasting). You may have this done every 1-3 years.  Mammogram. This may be done every 1-2 years. Talk with your health care provider about how often you should have regular mammograms.  BRCA-related cancer screening. This may be done if you have a family history of breast, ovarian, tubal, or peritoneal cancers. Other tests  Sexually transmitted disease (STD) testing.  Bone density scan. This is done to screen for osteoporosis. You may have this done starting at age 68 Follow these instructions at home: Eating and drinking  Eat a diet that includes fresh fruits and vegetables, whole grains, lean protein, and low-fat dairy products. Limit your intake of foods with high amounts of sugar, saturated fats, and salt.  Take vitamin and mineral supplements as recommended by your health care provider.  Do not drink alcohol if your health care provider tells you not to drink.  If you drink alcohol: ? Limit how much you have to 0-1 drink a day. ? Be aware of how much alcohol is in your drink. In the [place] one drink equals one 12 oz bottle of beer (355 mL), one 5 oz glass of  (148 mL), or one 1 oz glass of hard liquor (44 mL). Lifestyle  Take daily care of your teeth and gums.  Stay active. Exercise for at least 30 minutes on 5 or more days each week.  Do not use any products that contain nicotine or tobacco, such as cigarettes, e-cigarettes, and chewing tobacco. If you need  help quitting, ask your health care provider.  If you are sexually active, practice safe sex. Use a condom or other form of protection in order to prevent STIs (sexually transmitted infections).  Talk with your health care provider about taking a low-dose aspirin or statin. What's next?  Go to your health care provider once a year for a well check visit.  Ask your health care provider how often you should have your eyes and teeth checked.  Stay up to date on all vaccines. This information is not intended to replace advice given to you by your health care provider. Make sure you discuss any questions you have with your health care provider. Document Released: 10/12/2015 Document Revised: 09/09/2018 Document Reviewed: 09/09/2018 Elsevier Patient Education  2020 [employer].

## 2019-06-27 NOTE — Progress Notes (Signed)
Subjective:    Patient ID: Brittany Myers, female    DOB: 1951-05-02, 68 y.o.   MRN: [id number]  HPI The patient is here for fatigue.      Fatigue:  She has been feeling more fatigued and wants to make sure it not something more concerning.  She would like to have blood work to rule out some things.  She does not sleep well but that is not new.  She uses lorazepam every third night and when she takes it she sleeps well.  She does not want to take it nightly.  When she does not take it she only sleeps about 4 hours.  She would drink one glass of wine, but stopped doing that, but her sleep is not necessarily better.    She may have very low grade anxiety, but does not feel it is anything significant.  She is more thrown off by the turmoil in the world and her routine.  She has not been going to the gym and that she thinks has made a difference.     Medications and allergies reviewed with patient and updated if appropriate.  Patient Active Problem List   Diagnosis Date Noted  . Acute pain of left knee 03/11/2019  . Acute pain of right shoulder 03/11/2019  . Cough 10/13/2018  . Viral URI with cough 10/06/2018  . Rib pain on right side 06/24/2018  . Hyperlipidemia 05/21/2018  . Fatigue 05/21/2018  . GERD (gastroesophageal reflux disease) 07/28/2016  . Hyperglycemia 07/28/2016  . Anxiety 07/28/2016  . Nonspecific abnormal electrocardiogram (ECG) (EKG) 03/11/2016  . Shingles rash 02/13/2016  . 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 Family history of ischemic heart disease 05/20/2011  . Osteopenia 05/20/2011  . 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 TINNITUS, RIGHT 12/20/2009  . COLONIC POLYPS, BENIGN 02/11/2008    Current Outpatient Medications on File Prior to Visit  Medication Sig Dispense Refill  . acetaminophen (TYLENOL) 500 MG tablet Take 500 mg by mouth every 6 (six) hours as needed.    .  clobetasol cream (TEMOVATE) [id number] % Apply 1 application topically as needed.    Marland Kitchen EPINEPHrine 0.3 mg/0.3 mL IJ SOAJ injection Inject 0.3 mLs (0.3 mg total) into the muscle as needed. 2 Device 2  . estradiol (ESTRACE) 0.1 MG/GM vaginal cream Place 1 Applicatorful vaginally 2 (two) times a week.     Marland Kitchen LORazepam (ATIVAN) 0.5 MG tablet take 1 tablet by mouth every 8 hours if needed for anxiety 30 tablet 2   No current facility-administered medications on file prior to visit.     Past Medical History:  Diagnosis Date  . 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 Endometriosis   . Increased homocysteine (HCC)    high-normal  . Osteopenia    T score ; -2.1  @ L femoral neck in 2006  . Plantar fasciitis     Past Surgical History:  Procedure Laterality Date  . COLONOSCOPY  2013   Dr Sharlett Iles  . HEMORRHOID SURGERY    . LAPAROSCOPIC ENDOMETRIOSIS FULGURATION     5 surgeries; Dr Irven [place], Concha [place]  . POLYPECTOMY  2001    Dr.Sam [place]     Social History   Socioeconomic History  . Marital status: Married    Spouse name: Not on file  . Number of children: 0  . Years of education: Not on file  . Highest education level:  Not on file  Occupational History  . Occupation: Human resources officer  Social Needs  . Financial resource strain: Not hard at all  . Food insecurity    Worry: Never true    Inability: Never true  . Transportation needs    Medical: No    Non-medical: No  Tobacco Use  . Smoking status: Never Smoker  . Smokeless tobacco: Never Used  Substance and Sexual Activity  . Alcohol use: Yes    Comment:   3/ week  . Drug use: No  . Sexual activity: Yes  Lifestyle  . Physical activity    Days per week: 5 days    Minutes per session: 50 min  . Stress: Only a little  Relationships  . Social connections    Talks on phone: More than three times a week    Gets together: More than three times a week    Attends religious service: More than 4 times per year    Active member  of club or organization: Not on file    Attends meetings of clubs or organizations: Not on file    Relationship status: Married  Other Topics Concern  . Not on file  Social History Narrative   Heart healthy diet   Regular Exercise-no    Family History  Problem Relation Age of Onset  . Hyperlipidemia Brother   . Heart failure Father        heart attack @ 59 after CVA  . Stroke Father 87       hemorrhagic  after MI  . Diabetes Father   . Endometriosis Mother   . Hyperlipidemia Mother   . Osteopenia Mother   . Heart attack Maternal Grandfather 65  . Liver cancer Maternal Aunt   . Colon cancer Paternal Grandmother   . Alzheimer's disease Maternal Grandmother     Review of Systems  Constitutional: Positive for fatigue. Negative for chills and fever.  Respiratory: Negative for cough, shortness of breath and wheezing.   Cardiovascular: Negative for chest pain, palpitations and leg swelling.  Gastrointestinal: Negative for abdominal pain, blood in stool, constipation, diarrhea and nausea.       No gerd  Genitourinary: Negative for dysuria and hematuria.  Neurological: Positive for headaches. Negative for dizziness, weakness, light-headedness and numbness.  Psychiatric/Behavioral: Positive for sleep disturbance. Negative for dysphoric mood. The patient is nervous/anxious (low level).        Objective:   Vitals:   06/28/19 0850  BP: 126/74  Pulse: 70  Resp: 16  Temp: 98.4 F (36.9 C)  SpO2: 97%   BP Readings from Last 3 Encounters:  06/28/19 126/74  06/21/19 104/68  03/11/19 106/68   Wt Readings from Last 3 Encounters:  06/28/19 125 lb 12.8 oz (57.1 kg)  06/21/19 127 lb (57.6 kg)  03/11/19 126 lb 6.4 oz (57.3 kg)   Body mass index is 21.59 kg/m.   Physical Exam    Constitutional: Appears well-developed and well-nourished. No distress.  HENT:  Head: Normocephalic and atraumatic.  Neck: Neck supple. No tracheal deviation present. No thyromegaly present.  No  cervical lymphadenopathy Cardiovascular: Normal rate, regular rhythm and normal heart sounds.   No murmur heard. No carotid bruit .  No edema Pulmonary/Chest: Effort normal and breath sounds normal. No respiratory distress. No has no wheezes. No rales.  Abdomen: soft, NT, ND Skin: Skin is warm and dry. Not diaphoretic.  Psychiatric: Normal mood and affect. Behavior is normal.      Assessment &  Plan:    See Problem List for Assessment and Plan of chronic medical problems.

## 2019-06-28 ENCOUNTER — Other Ambulatory Visit: Payer: Self-pay

## 2019-06-28 ENCOUNTER — Encounter: Payer: Self-pay | Admitting: Internal Medicine

## 2019-06-28 ENCOUNTER — Ambulatory Visit (INDEPENDENT_AMBULATORY_CARE_PROVIDER_SITE_OTHER): Payer: Medicare Other | Admitting: Internal Medicine

## 2019-06-28 ENCOUNTER — Other Ambulatory Visit (INDEPENDENT_AMBULATORY_CARE_PROVIDER_SITE_OTHER): Payer: Medicare Other

## 2019-06-28 VITALS — BP 126/74 | HR 70 | Temp 98.4°F | Resp 16 | Ht 64.0 in | Wt 125.8 lb

## 2019-06-28 DIAGNOSIS — R5383 Other fatigue: Secondary | ICD-10-CM

## 2019-06-28 DIAGNOSIS — F419 Anxiety disorder, unspecified: Secondary | ICD-10-CM | POA: Diagnosis not present

## 2019-06-28 DIAGNOSIS — G479 Sleep disorder, unspecified: Secondary | ICD-10-CM | POA: Diagnosis not present

## 2019-06-28 LAB — CBC WITH DIFFERENTIAL/PLATELET
Basophils Absolute: 0 10*3/uL (ref 0.0–0.1)
Basophils Relative: 0.6 % (ref 0.0–3.0)
Eosinophils Absolute: 0.1 10*3/uL (ref 0.0–0.7)
Eosinophils Relative: 2 % (ref 0.0–5.0)
HCT: 44.9 % (ref 36.0–46.0)
Hemoglobin: 15.4 g/dL — ABNORMAL HIGH (ref 12.0–15.0)
Lymphocytes Relative: 27.9 % (ref 12.0–46.0)
Lymphs Abs: 1.7 10*3/uL (ref 0.7–4.0)
MCHC: 34.3 g/dL (ref 30.0–36.0)
MCV: 93.9 fl (ref 78.0–100.0)
Monocytes Absolute: 0.5 10*3/uL (ref 0.1–1.0)
Monocytes Relative: 7.7 % (ref 3.0–12.0)
Neutro Abs: 3.8 10*3/uL (ref 1.4–7.7)
Neutrophils Relative %: 61.8 % (ref 43.0–77.0)
Platelets: 214 10*3/uL (ref 150.0–400.0)
RBC: 4.78 Mil/uL (ref 3.87–5.11)
RDW: 12.4 % (ref 11.5–15.5)
WBC: 6.1 10*3/uL (ref 4.0–10.5)

## 2019-06-28 LAB — COMPREHENSIVE METABOLIC PANEL
ALT: 14 U/L (ref 0–35)
AST: 18 U/L (ref 0–37)
Albumin: 4.4 g/dL (ref 3.5–5.2)
Alkaline Phosphatase: 59 U/L (ref 39–117)
BUN: 19 mg/dL (ref 6–23)
CO2: 25 mEq/L (ref 19–32)
Calcium: 9.5 mg/dL (ref 8.4–10.5)
Chloride: 105 mEq/L (ref 96–112)
Creatinine, Ser: 0.7 mg/dL (ref 0.40–1.20)
GFR: 83.16 mL/min (ref >60.00)
Glucose, Bld: 102 mg/dL — ABNORMAL HIGH (ref 70–99)
Potassium: 4.3 mEq/L (ref 3.5–5.1)
Sodium: 139 mEq/L (ref 135–145)
Total Bilirubin: 0.8 mg/dL (ref 0.2–1.2)
Total Protein: 6.9 g/dL (ref 6.0–8.3)

## 2019-06-28 LAB — TSH: TSH: 1.61 u[IU]/mL (ref 0.35–4.50)

## 2019-06-28 MED ORDER — LORAZEPAM 0.5 MG PO TABS: ORAL_TABLET | ORAL | 2 refills | Status: DC

## 2019-06-28 MED ORDER — CALCIUM CITRATE + PO TABS: ORAL_TABLET | ORAL | 0 refills | Status: AC

## 2019-06-28 MED ORDER — MULTIVITAMINS PO CAPS: 1.0000 | ORAL_CAPSULE | Freq: Every day | ORAL | Status: AC

## 2019-06-28 NOTE — Assessment & Plan Note (Signed)
Taking lorazepam every third night - works well when she takes it and wants to avoid it nightly Nights she does not taking it she does not sleep well She does have fatigue Will try to start more regular exercise to see if that helps her sleep Can try trazodone nightly  Can consider low dose anti-anxiety medication

## 2019-06-28 NOTE — Assessment & Plan Note (Signed)
Low level anxiety - may be contributing to her poor sleep If regular exercise does not improve sleep can consider low dose anti-anxiety medication

## 2019-06-28 NOTE — Assessment & Plan Note (Signed)
Her poor sleep is likely contributing, but we want to rule out other contributing factors - no other concerning symptoms  Cbc, cmp, tsh Start more regular exercise

## 2019-06-28 NOTE — Patient Instructions (Addendum)
  Tests ordered today. Your results will be released to [place] (or called to you) after review.  If any changes need to be made, you will be notified at that same time.    Medications reviewed and updated.  Changes include :   none  Your prescription(s) have been submitted to your pharmacy. Please take as directed and contact our office if you believe you are having problem(s) with the medication(s).   We can consider a low dose anti-anxiety medication or low dose trazodone ( sleep medication).

## 2019-07-01 ENCOUNTER — Encounter: Payer: Self-pay | Admitting: Internal Medicine

## 2019-07-01 DIAGNOSIS — J029 Acute pharyngitis, unspecified: Secondary | ICD-10-CM

## 2019-07-03 DIAGNOSIS — Z1159 Encounter for screening for other viral diseases: Secondary | ICD-10-CM | POA: Diagnosis not present

## 2019-07-20 DIAGNOSIS — L570 Actinic keratosis: Secondary | ICD-10-CM | POA: Diagnosis not present

## 2019-07-20 DIAGNOSIS — L814 Other melanin hyperpigmentation: Secondary | ICD-10-CM | POA: Diagnosis not present

## 2019-07-20 DIAGNOSIS — L821 Other seborrheic keratosis: Secondary | ICD-10-CM | POA: Diagnosis not present

## 2019-07-20 DIAGNOSIS — B0089 Other herpesviral infection: Secondary | ICD-10-CM | POA: Diagnosis not present

## 2019-07-20 DIAGNOSIS — D485 Neoplasm of uncertain behavior of skin: Secondary | ICD-10-CM | POA: Diagnosis not present

## 2019-07-20 DIAGNOSIS — L820 Inflamed seborrheic keratosis: Secondary | ICD-10-CM | POA: Diagnosis not present

## 2019-07-20 DIAGNOSIS — L812 Freckles: Secondary | ICD-10-CM | POA: Diagnosis not present

## 2019-07-20 DIAGNOSIS — D225 Melanocytic nevi of trunk: Secondary | ICD-10-CM | POA: Diagnosis not present

## 2019-07-20 DIAGNOSIS — D1801 Hemangioma of skin and subcutaneous tissue: Secondary | ICD-10-CM | POA: Diagnosis not present

## 2019-07-20 DIAGNOSIS — Z85828 Personal history of other malignant neoplasm of skin: Secondary | ICD-10-CM | POA: Diagnosis not present

## 2019-07-20 DIAGNOSIS — L603 Nail dystrophy: Secondary | ICD-10-CM | POA: Diagnosis not present

## 2019-08-09 ENCOUNTER — Other Ambulatory Visit: Payer: Self-pay

## 2019-08-09 ENCOUNTER — Ambulatory Visit: Payer: Self-pay

## 2019-08-09 ENCOUNTER — Encounter: Payer: Self-pay | Admitting: Family Medicine

## 2019-08-09 ENCOUNTER — Ambulatory Visit (INDEPENDENT_AMBULATORY_CARE_PROVIDER_SITE_OTHER): Payer: Medicare Other | Admitting: Family Medicine

## 2019-08-09 VITALS — BP 119/80 | HR 99 | Ht 64.0 in | Wt 122.0 lb

## 2019-08-09 DIAGNOSIS — S92355A Nondisplaced fracture of fifth metatarsal bone, left foot, initial encounter for closed fracture: Secondary | ICD-10-CM | POA: Diagnosis not present

## 2019-08-09 DIAGNOSIS — S92355D Nondisplaced fracture of fifth metatarsal bone, left foot, subsequent encounter for fracture with routine healing: Secondary | ICD-10-CM | POA: Insufficient documentation

## 2019-08-09 DIAGNOSIS — M25572 Pain in left ankle and joints of left foot: Secondary | ICD-10-CM

## 2019-08-09 NOTE — Progress Notes (Signed)
Brittany Myers - 68 y.o. female MRN [id number]  Date of birth: 07/15/1951  SUBJECTIVE:  Including CC & ROS.  Chief Complaint  Patient presents with  . Foot Injury    left foot x 08/09/2019    Brittany Myers is a 68 y.o. female that is presenting with acute left ankle and foot pain.  She fell earlier today and had an inversion injury.  Since that time she is having pain on the lateral aspect of her foot as well as the ankle.  Having significant swelling and ecchymosis.  The pain is constant and severe.  She was walking the dog and the dog pulled her over.  Denies any numbness or tingling.  Has not taken anything for the pain.    Review of Systems  Constitutional: Negative for fever.  HENT: Negative for congestion.   Respiratory: Negative for cough.   Cardiovascular: Negative for chest pain.  Gastrointestinal: Negative for abdominal pain.  Musculoskeletal: Positive for gait problem.  Skin: Positive for color change.  Neurological: Negative for weakness.  Hematological: Negative for adenopathy.    HISTORY: Past Medical, Surgical, Social, and Family History Reviewed & Updated per EMR.   Pertinent Historical Findings include:  Past Medical History:  Diagnosis Date  . 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 Endometriosis   . Increased homocysteine    high-normal  . Osteopenia    T score ; -2.1  @ L femoral neck in 2006  . Plantar fasciitis     Past Surgical History:  Procedure Laterality Date  . COLONOSCOPY  2013   Dr Sharlett Iles  . HEMORRHOID SURGERY    . LAPAROSCOPIC ENDOMETRIOSIS FULGURATION     5 surgeries; Dr Irven [place], Concha [place]  . POLYPECTOMY  2001    Dr.Sam Bartlett     Allergies  Allergen Reactions  . Ciprofloxacin     REACTION: red ,itchy feet, "furry throat" Because of a history of documented adverse serious drug reaction;Medi Alert bracelet  is recommended  . Penicillins     REACTION: angioedema Because of a history of documented adverse serious drug reaction;Medi Alert  bracelet  is recommended  . Terbinafine Hcl     Angioedema & shock 1999 from oral Lamisil  Because of a history of documented adverse serious drug reaction;Medi Alert bracelet  is recommended  . Erythromycin     REACTION: headaches  . Minocycline Hcl     REACTION: photosensitivity post sun exposure  . Zolpidem Tartrate     REACTION: "weepy"    Family History  Problem Relation Age of Onset  . Hyperlipidemia Brother   . Heart failure Father        heart attack @ 32 after CVA  . Stroke Father 46       hemorrhagic  after MI  . Diabetes Father   . Endometriosis Mother   . Hyperlipidemia Mother   . Osteopenia Mother   . Heart attack Maternal Grandfather 65  . Liver cancer Maternal Aunt   . Colon cancer Paternal Grandmother   . Alzheimer's disease Maternal Grandmother      Social History   Socioeconomic History  . Marital status: Married    Spouse name: Not on file  . Number of children: 0  . Years of education: Not on file  . Highest education level: Not on file  Occupational History  . Occupation: Human resources officer  Social Needs  . Financial resource strain: Not hard at all  . Food insecurity  Worry: Never true    Inability: Never true  . Transportation needs    Medical: No    Non-medical: No  Tobacco Use  . Smoking status: Never Smoker  . Smokeless tobacco: Never Used  Substance and Sexual Activity  . Alcohol use: Yes    Comment:   3/ week  . Drug use: No  . Sexual activity: Yes  Lifestyle  . Physical activity    Days per week: 5 days    Minutes per session: 50 min  . Stress: Only a little  Relationships  . Social connections    Talks on phone: More than three times a week    Gets together: More than three times a week    Attends religious service: More than 4 times per year    Active member of club or organization: Not on file    Attends meetings of clubs or organizations: Not on file    Relationship status: Married  . Intimate  partner violence    Fear of current or ex partner: No    Emotionally abused: No    Physically abused: No    Forced sexual activity: No  Other Topics Concern  . Not on file  Social History Narrative   Heart healthy diet   Regular Exercise-no     PHYSICAL EXAM:  VS: BP 119/80   Pulse 99   Ht 5\' 4"  (1.626 m)   Wt 122 lb (55.3 kg)   BMI 20.94 kg/m  Physical Exam Gen: NAD, alert, cooperative with exam, well-appearing ENT: normal lips, normal nasal mucosa,  Eye: normal EOM, normal conjunctiva and lids CV:  no edema, +2 pedal pulses   Resp: no accessory muscle use, non-labored,   Skin: no rashes, no areas of induration  Neuro: normal tone, normal sensation to touch Psych:  normal insight, alert and oriented MSK:  Left ankle/foot:  Swelling and ecchymosis over the dorsal and lateral foot and ankle. Limited flexion extension secondary to pain. Tenderness to palpation over the distal fibula as well as the base of the fifth metatarsal. No tenderness to palpation over the navicular or medial malleolus. Mildly positive anterior drawer Unable to stand without significant pain. Neurovascularly intact  Limited ultrasound: Left ankle/foot:  Possible avulsion fracture at the distal fibula. No effusion appreciated within the ankle joint itself. No significant changes appreciated of the ATFL. Normal-appearing peroneal tendons. Fracture at the base of the fifth metatarsal to suggest a Jones fracture  Summary: Possible avulsion fracture of the distal fibula as well as a Jones fracture at the base of the fifth  Ultrasound and interpretation by Clearance Coots, MD    ASSESSMENT & PLAN:   Nondisplaced fracture of fifth metatarsal bone, left foot, initial encounter for closed fracture Injury occurred earlier today. Has likely a Jones fracture. Has a history of osteopenia.  - CAM walker and crutches  - counseled on supportive care - counseled on vitamin K2  - could consider prolia or  evenity  - f/u and would get imaging at that time.

## 2019-08-09 NOTE — Telephone Encounter (Signed)
Incoming call from Pt.  Who states that she feel of of porch  Fell on left foot.   Onset was this morning.   Beginning to swell cant put pressure on it.  Pain was rated severe until  She took a tylenol. Tetanus is current  .  Reviewed Protocol recommends that Pt.  Go to Ed or Urgent Care.  Pt.  Agreed.           Reason for Disposition . Can't stand (bear weight) or walk  Answer Assessment - Initial Assessment Questions 1. MECHANISM: "How did the injury happen?" (e.g., twisting injury, direct blow)    Fell off lporch 2. ONSET: "When did the injury happen?" (Minutes or hours ago)      This am 3. LOCATION: "Where is the injury located?"      Left foot 4. APPEARANCE of INJURY: "What does the injury look like?"      Swollen beingin to turn cant put pressure 5. WEIGHT-BEARING: "Can you put weight on that foot?" "Can you walk (four steps or more)?"       no 6. SIZE: For cuts, bruises, or swelling, ask: "How large is it?" (e.g., inches or centimeters;  entire joint)      swelling 7. PAIN: "Is there pain?" If so, ask: "How bad is the pain?"    (e.g., Scale 1-10; or mild, moderate, severe)    Severe until took Tylenol 8. TETANUS: For any breaks in the skin, ask: "When was the last tetanus booster?"    Current.  9. OTHER SYMPTOMS: "Do you have any other symptoms?"     denies 10. PREGNANCY: "Is there any chance you are pregnant?" "When was your last menstrual period?"      na  Protocols used: ANKLE AND FOOT INJURY-A-AH

## 2019-08-09 NOTE — Patient Instructions (Signed)
Good to see you Please try ice and elevation  Please use the boot if you are going anywhere  Please talk to Dr. Quay Burow about prolia or evenity   Please send me a message in [place] with any questions or updates.  Please see me back in 10-14 days.   --Dr. Raeford Razor

## 2019-08-09 NOTE — Assessment & Plan Note (Signed)
Injury occurred earlier today. Has likely a Jones fracture. Has a history of osteopenia.  - CAM walker and crutches  - counseled on supportive care - counseled on vitamin K2  - could consider prolia or evenity  - f/u and would get imaging at that time.

## 2019-08-19 ENCOUNTER — Ambulatory Visit (INDEPENDENT_AMBULATORY_CARE_PROVIDER_SITE_OTHER): Payer: Medicare Other | Admitting: Family Medicine

## 2019-08-19 ENCOUNTER — Ambulatory Visit (HOSPITAL_BASED_OUTPATIENT_CLINIC_OR_DEPARTMENT_OTHER)
Admission: RE | Admit: 2019-08-19 | Discharge: 2019-08-19 | Disposition: A | Discharge status: Home / Self Care | Payer: Medicare Other | Source: Ambulatory Visit | Attending: Family Medicine | Admitting: Family Medicine

## 2019-08-19 ENCOUNTER — Encounter: Payer: Self-pay | Admitting: Family Medicine

## 2019-08-19 ENCOUNTER — Other Ambulatory Visit: Payer: Self-pay

## 2019-08-19 VITALS — BP 94/65 | HR 71 | Ht 64.0 in | Wt 122.0 lb

## 2019-08-19 DIAGNOSIS — M79672 Pain in left foot: Secondary | ICD-10-CM | POA: Insufficient documentation

## 2019-08-19 DIAGNOSIS — M25572 Pain in left ankle and joints of left foot: Secondary | ICD-10-CM | POA: Insufficient documentation

## 2019-08-19 DIAGNOSIS — S92355A Nondisplaced fracture of fifth metatarsal bone, left foot, initial encounter for closed fracture: Secondary | ICD-10-CM

## 2019-08-19 DIAGNOSIS — S92352A Displaced fracture of fifth metatarsal bone, left foot, initial encounter for closed fracture: Secondary | ICD-10-CM | POA: Diagnosis not present

## 2019-08-19 DIAGNOSIS — S92355D Nondisplaced fracture of fifth metatarsal bone, left foot, subsequent encounter for fracture with routine healing: Secondary | ICD-10-CM | POA: Diagnosis not present

## 2019-08-19 DIAGNOSIS — M7741 Metatarsalgia, right foot: Secondary | ICD-10-CM | POA: Insufficient documentation

## 2019-08-19 MED ORDER — AMBULATORY NON FORMULARY MEDICATION: 0 refills | Status: DC

## 2019-08-19 NOTE — Assessment & Plan Note (Signed)
Tenderness over the lateral malleolus.  Concern for fracture. -X-ray. -Counseled on supportive care

## 2019-08-19 NOTE — Assessment & Plan Note (Addendum)
Following up after 1 week of 5th metatarsal fracture -Continue cam walker -X-ray -Rolling knee scooter. -Counseled on home exercise therapy and supportive care

## 2019-08-19 NOTE — Patient Instructions (Signed)
Good to see you Please continue elevation  Please try ice  I will call you with the results from today  Please send me a message in MyChart with any questions or updates.  I will let you know about follow up.   --Dr. Raeford Razor

## 2019-08-19 NOTE — Progress Notes (Signed)
Brittany Myers - 68 y.o. female MRN [id number]  Date of birth: 20-Feb-1951  SUBJECTIVE:  Including CC & ROS.  Chief Complaint  Patient presents with  . Follow-up    follow up for left ankle    Brittany Myers is a 68 y.o. female that is following up for her left foot fracture.  Initial injury was on 11/10.  She was placed in a cam walker and provided crutches.  She is try to be nonweightbearing since that time.  She is still having swelling over the dorsum of the foot as well as ankle.  She has pain with any form of ambulation.  Has been taking Tylenol with limited improvement.  Has tried ice and elevate.   Review of Systems  Constitutional: Negative for fever.  HENT: Negative for congestion.   Respiratory: Negative for cough.   Cardiovascular: Negative for chest pain.  Gastrointestinal: Negative for abdominal pain.  Musculoskeletal: Positive for gait problem and joint swelling.  Skin: Negative for color change.  Neurological: Negative for syncope.  Hematological: Negative for adenopathy.  Psychiatric/Behavioral: Negative for agitation.    HISTORY: Past Medical, Surgical, Social, and Family History Reviewed & Updated per EMR.   Pertinent Historical Findings include:  Past Medical History:  Diagnosis Date  . 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 Endometriosis   . Increased homocysteine    high-normal  . Osteopenia    T score ; -2.1  @ L femoral neck in 2006  . Plantar fasciitis     Past Surgical History:  Procedure Laterality Date  . COLONOSCOPY  2013   Dr Sharlett Iles  . HEMORRHOID SURGERY    . LAPAROSCOPIC ENDOMETRIOSIS FULGURATION     5 surgeries; Dr Irven [place], Concha [place]  . POLYPECTOMY  2001    Dr.Sam Barnett     Allergies  Allergen Reactions  . Ciprofloxacin     REACTION: red ,itchy feet, "furry throat" Because of a history of documented adverse serious drug reaction;Medi Alert bracelet  is recommended  . Penicillins     REACTION: angioedema Because of a history of documented  adverse serious drug reaction;Medi Alert bracelet  is recommended  . Terbinafine Hcl     Angioedema & shock 1999 from oral Lamisil  Because of a history of documented adverse serious drug reaction;Medi Alert bracelet  is recommended  . Erythromycin     REACTION: headaches  . Minocycline Hcl     REACTION: photosensitivity post sun exposure  . Zolpidem Tartrate     REACTION: "weepy"    Family History  Problem Relation Age of Onset  . Hyperlipidemia Brother   . Heart failure Father        heart attack @ 47 after CVA  . Stroke Father 63       hemorrhagic  after MI  . Diabetes Father   . Endometriosis Mother   . Hyperlipidemia Mother   . Osteopenia Mother   . Heart attack Maternal Grandfather 65  . Liver cancer Maternal Aunt   . Colon cancer Paternal Grandmother   . Alzheimer's disease Maternal Grandmother      Social History   Socioeconomic History  . Marital status: Married    Spouse name: Not on file  . Number of children: 0  . Years of education: Not on file  . Highest education level: Not on file  Occupational History  . Occupation: Human resources officer  Social Needs  . Financial resource strain: Not hard at all  . Food  insecurity    Worry: Never true    Inability: Never true  . Transportation needs    Medical: No    Non-medical: No  Tobacco Use  . Smoking status: Never Smoker  . Smokeless tobacco: Never Used  Substance and Sexual Activity  . Alcohol use: Yes    Comment:   3/ week  . Drug use: No  . Sexual activity: Yes  Lifestyle  . Physical activity    Days per week: 5 days    Minutes per session: 50 min  . Stress: Only a little  Relationships  . Social connections    Talks on phone: More than three times a week    Gets together: More than three times a week    Attends religious service: More than 4 times per year    Active member of club or organization: Not on file    Attends meetings of clubs or organizations: Not on file     Relationship status: Married  . Intimate partner violence    Fear of current or ex partner: No    Emotionally abused: No    Physically abused: No    Forced sexual activity: No  Other Topics Concern  . Not on file  Social History Narrative   Heart healthy diet   Regular Exercise-no     PHYSICAL EXAM:  VS: BP 94/65   Pulse 71   Ht 5\' 4"  (1.626 m)   Wt 122 lb (55.3 kg)   BMI 20.94 kg/m  Physical Exam Gen: NAD, alert, cooperative with exam, well-appearing ENT: normal lips, normal nasal mucosa,  Eye: normal EOM, normal conjunctiva and lids CV:  no edema, +2 pedal pulses   Resp: no accessory muscle use, non-labored,   Skin: no rashes, no areas of induration  Neuro: normal tone, normal sensation to touch Psych:  normal insight, alert and oriented MSK:  Left ankle /foot: Obvious swelling over the dorsum of the foot and ankle. Tenderness to palpation over lateral malleolus. Tenderness palpation over the base the fifth metatarsal. Limited dorsiflexion and plantarflexion. No obvious ecchymosis. Neurovascularly intact     ASSESSMENT & PLAN:   Nondisplaced fracture of fifth metatarsal bone, left foot, initial encounter for closed fracture Following up after 1 week of 5th metatarsal fracture -Continue cam walker -X-ray -Rolling knee scooter. -Counseled on home exercise therapy and supportive care  Acute left ankle pain Tenderness over the lateral malleolus.  Concern for fracture. -X-ray. -Counseled on supportive care

## 2019-08-22 ENCOUNTER — Telehealth: Payer: Self-pay | Admitting: Family Medicine

## 2019-08-22 NOTE — Telephone Encounter (Signed)
Informed of xray results.   Rosemarie Ax, MD [facility] 08/22/2019, 8:30 AM

## 2019-09-15 ENCOUNTER — Ambulatory Visit (INDEPENDENT_AMBULATORY_CARE_PROVIDER_SITE_OTHER): Payer: Medicare Other | Admitting: Family Medicine

## 2019-09-15 ENCOUNTER — Encounter: Payer: Self-pay | Admitting: Family Medicine

## 2019-09-15 ENCOUNTER — Other Ambulatory Visit: Payer: Self-pay

## 2019-09-15 VITALS — BP 112/71 | HR 83 | Ht 64.0 in | Wt 122.0 lb

## 2019-09-15 DIAGNOSIS — S92902A Unspecified fracture of left foot, initial encounter for closed fracture: Secondary | ICD-10-CM

## 2019-09-15 DIAGNOSIS — S92355D Nondisplaced fracture of fifth metatarsal bone, left foot, subsequent encounter for fracture with routine healing: Secondary | ICD-10-CM | POA: Diagnosis present

## 2019-09-15 DIAGNOSIS — S93622A Sprain of tarsometatarsal ligament of left foot, initial encounter: Secondary | ICD-10-CM | POA: Diagnosis not present

## 2019-09-15 HISTORY — DX: Unspecified fracture of left foot, initial encounter for closed fracture: S92.902A

## 2019-09-15 NOTE — Progress Notes (Signed)
Brittany Myers - 68 y.o. female MRN [id number]  Date of birth: 1951/09/16  SUBJECTIVE:  Including CC & ROS.  Chief Complaint  Patient presents with  . Follow-up    follow up for left ankle    Brittany Myers is a 68 y.o. female that is following up for her left foot fracture.  She is unable to ambulate without significant pain.  She is having to use a rolling scooter.  She still experiences pain in the dorsal midfoot as well as had this new onset ecchymosis on the plantar aspect of the longitudinal arch.  Denies any numbness or tingling.   Review of Systems  Constitutional: Negative for fever.  HENT: Negative for congestion.   Respiratory: Negative for cough.   Cardiovascular: Negative for chest pain.  Gastrointestinal: Negative for abdominal pain.  Musculoskeletal: Positive for gait problem.  Skin: Positive for color change.  Neurological: Negative for weakness.  Hematological: Negative for adenopathy.    HISTORY: Past Medical, Surgical, Social, and Family History Reviewed & Updated per EMR.   Pertinent Historical Findings include:  Past Medical History:  Diagnosis Date  . 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 Endometriosis   . Increased homocysteine    high-normal  . Osteopenia    T score ; -2.1  @ L femoral neck in 2006  . Plantar fasciitis     Past Surgical History:  Procedure Laterality Date  . COLONOSCOPY  2013   Dr Sharlett Iles  . HEMORRHOID SURGERY    . LAPAROSCOPIC ENDOMETRIOSIS FULGURATION     5 surgeries; Dr Irven [place], Concha [place]  . POLYPECTOMY  2001    Dr.Sam Dresser     Allergies  Allergen Reactions  . Ciprofloxacin     REACTION: red ,itchy feet, "furry throat" Because of a history of documented adverse serious drug reaction;Medi Alert bracelet  is recommended  . Penicillins     REACTION: angioedema Because of a history of documented adverse serious drug reaction;Medi Alert bracelet  is recommended  . Terbinafine Hcl     Angioedema & shock 1999 from oral Lamisil    Because of a history of documented adverse serious drug reaction;Medi Alert bracelet  is recommended  . Erythromycin     REACTION: headaches  . Minocycline Hcl     REACTION: photosensitivity post sun exposure  . Zolpidem Tartrate     REACTION: "weepy"    Family History  Problem Relation Age of Onset  . Hyperlipidemia Brother   . Heart failure Father        heart attack @ 34 after CVA  . Stroke Father 53       hemorrhagic  after MI  . Diabetes Father   . Endometriosis Mother   . Hyperlipidemia Mother   . Osteopenia Mother   . Heart attack Maternal Grandfather 65  . Liver cancer Maternal Aunt   . Colon cancer Paternal Grandmother   . Alzheimer's disease Maternal Grandmother      Social History   Socioeconomic History  . Marital status: Married    Spouse name: Not on file  . Number of children: 0  . Years of education: Not on file  . Highest education level: Not on file  Occupational History  . Occupation: Human resources officer  Tobacco Use  . Smoking status: Never Smoker  . Smokeless tobacco: Never Used  Substance and Sexual Activity  . Alcohol use: Yes    Comment:   3/ week  . Drug use: No  .  Sexual activity: Yes  Other Topics Concern  . Not on file  Social History Narrative   Heart healthy diet   Regular Exercise-no   Social Determinants of Health   Financial Resource Strain:   . Difficulty of Paying Living Expenses: Not on file  Food Insecurity:   . Worried About Charity fundraiser in the Last Year: Not on file  . Ran Out of Food in the Last Year: Not on file  Transportation Needs:   . Lack of Transportation (Medical): Not on file  . Lack of Transportation (Non-Medical): Not on file  Physical Activity: Sufficiently Active  . Days of Exercise per Week: 5 days  . Minutes of Exercise per Session: 50 min  Stress: No Stress Concern Present  . Feeling of Stress : Only a little  Social Connections:   . Frequency of Communication with  Friends and Family: Not on file  . Frequency of Social Gatherings with Friends and Family: Not on file  . Attends Religious Services: Not on file  . Active Member of Clubs or Organizations: Not on file  . Attends Archivist Meetings: Not on file  . Marital Status: Not on file  Intimate Partner Violence:   . Fear of Current or Ex-Partner: Not on file  . Emotionally Abused: Not on file  . Physically Abused: Not on file  . Sexually Abused: Not on file     PHYSICAL EXAM:  VS: BP 112/71   Pulse 83   Ht 5\' 4"  (1.626 m)   Wt 122 lb (55.3 kg)   BMI 20.94 kg/m  Physical Exam Gen: NAD, alert, cooperative with exam, well-appearing ENT: normal lips, normal nasal mucosa,  Eye: normal EOM, normal conjunctiva and lids CV:  no edema, +2 pedal pulses   Resp: no accessory muscle use, non-labored,  Skin: no rashes, no areas of induration  Neuro: normal tone, normal sensation to touch Psych:  normal insight, alert and oriented MSK:  Left foot: Obviously swollen left foot. Tenderness to palpation of the base of the fifth. Tenderness palpation over the Lisfranc ligament. Some ecchymosis over the plantar aspect of the foot. Neurovascularly intact   ASSESSMENT & PLAN:   Nondisplaced fracture of fifth metatarsal bone, left foot, subsequent encounter for fracture with routine healing Initial injury is 11/10.  Has been using a rolling scooter as well as the cam walker. -Counseled supportive care. -Follow-up in 1 month.  Lisfranc's sprain, left, initial encounter She has significant tenderness over the Lisfranc ligament as well as ecchymosis over the plantar aspect.  Does have significant swelling of the foot as well.  May have had an injury to the ligament -MRI to evaluate the Lisfranc ligament.

## 2019-09-15 NOTE — Assessment & Plan Note (Signed)
Initial injury is 11/10.  Has been using a rolling scooter as well as the cam walker. -Counseled supportive care. -Follow-up in 1 month.

## 2019-09-15 NOTE — Patient Instructions (Signed)
Good to see you Continue being non weight bearing  Please try ice   Please send me a message in MyChart with any questions or updates.  We will schedule a virtual visit once the MRI is completed.   --Dr. Raeford Razor

## 2019-09-15 NOTE — Assessment & Plan Note (Signed)
She has significant tenderness over the Lisfranc ligament as well as ecchymosis over the plantar aspect.  Does have significant swelling of the foot as well.  May have had an injury to the ligament -MRI to evaluate the Lisfranc ligament.

## 2019-09-17 ENCOUNTER — Other Ambulatory Visit: Payer: Self-pay

## 2019-09-17 ENCOUNTER — Ambulatory Visit
Admission: RE | Admit: 2019-09-17 | Discharge: 2019-09-17 | Disposition: A | Discharge status: Home / Self Care | Payer: Medicare Other | Source: Ambulatory Visit | Attending: Family Medicine | Admitting: Family Medicine

## 2019-09-17 DIAGNOSIS — S93622A Sprain of tarsometatarsal ligament of left foot, initial encounter: Secondary | ICD-10-CM

## 2019-09-19 ENCOUNTER — Ambulatory Visit: Payer: Medicare Other | Admitting: Family Medicine

## 2019-09-19 ENCOUNTER — Telehealth (INDEPENDENT_AMBULATORY_CARE_PROVIDER_SITE_OTHER): Payer: Medicare Other | Admitting: Family Medicine

## 2019-09-19 DIAGNOSIS — S92902D Unspecified fracture of left foot, subsequent encounter for fracture with routine healing: Secondary | ICD-10-CM

## 2019-09-19 DIAGNOSIS — S92355D Nondisplaced fracture of fifth metatarsal bone, left foot, subsequent encounter for fracture with routine healing: Secondary | ICD-10-CM

## 2019-09-19 NOTE — Assessment & Plan Note (Signed)
MRI was unrevealing for Lisfranc injury but there is bone marrow edema of the cuboid, calcaneus and talus which is either bone contusions or occult fractures.  These are nondisplaced. -Counseled on supportive care. -We will repeat x-ray.

## 2019-09-19 NOTE — Assessment & Plan Note (Signed)
Observed on MRI.  - f/u in 4 weeks.

## 2019-09-19 NOTE — Progress Notes (Signed)
Virtual Visit via Video Note  I connected with Brittany Myers on 09/19/19 at  2:50 PM EST by a video enabled telemedicine application and verified that I am speaking with the correct person using two identifiers.   I discussed the limitations of evaluation and management by telemedicine and the availability of in person appointments. The patient expressed understanding and agreed to proceed.  History of Present Illness:  [name] is a 68 year old female who is following up for her left foot pain.  She had been diagnosed with a nondisplaced fracture of the fifth metatarsal but there was concern with her ongoing pain and swelling of another process.  MRI was showing the fracture of the fifth metatarsal.   It is also showing bone marrow edema of the cuboid and calcaneus as well as the talus.  These are nondisplaced fractures.  Observations/Objective:  Gen: NAD, alert, cooperative with exam, well-appearing ENT: normal lips, normal nasal mucosa,  Eye: normal EOM, normal conjunctiva and lids   Resp: no accessory muscle use, non-labored,  Psych:  normal insight, alert and oriented   Assessment and Plan:  Foot fracture left:  MRI was unrevealing for Lisfranc injury but there is bone marrow edema of the cuboid, calcaneus and talus which is either bone contusions or occult fractures.  These are nondisplaced. -Counseled on supportive care. -We will repeat x-ray.  Nondisplaced fracture of fifth metatarsal bone of left foot Observed on MRI.  - f/u in 4 weeks.   Follow Up Instructions:    I discussed the assessment and treatment plan with the patient. The patient was provided an opportunity to ask questions and all were answered. The patient agreed with the plan and demonstrated an understanding of the instructions.   The patient was advised to call back or seek an in-person evaluation if the symptoms worsen or if the condition fails to improve as anticipated.   Clearance Coots, MD

## 2019-10-11 ENCOUNTER — Encounter: Payer: Self-pay | Admitting: Internal Medicine

## 2019-10-11 ENCOUNTER — Encounter: Payer: Self-pay | Admitting: Family Medicine

## 2019-10-17 ENCOUNTER — Ambulatory Visit: Payer: Medicare Other | Admitting: Family Medicine

## 2019-10-17 DIAGNOSIS — M810 Age-related osteoporosis without current pathological fracture: Secondary | ICD-10-CM | POA: Diagnosis not present

## 2019-10-17 DIAGNOSIS — M8589 Other specified disorders of bone density and structure, multiple sites: Secondary | ICD-10-CM | POA: Diagnosis not present

## 2019-10-17 LAB — HM DEXA SCAN

## 2019-10-20 ENCOUNTER — Telehealth: Payer: Self-pay | Admitting: Family Medicine

## 2019-10-20 ENCOUNTER — Ambulatory Visit (HOSPITAL_BASED_OUTPATIENT_CLINIC_OR_DEPARTMENT_OTHER)
Admission: RE | Admit: 2019-10-20 | Discharge: 2019-10-20 | Disposition: A | Discharge status: Home / Self Care | Payer: Medicare Other | Source: Ambulatory Visit | Attending: Family Medicine | Admitting: Family Medicine

## 2019-10-20 ENCOUNTER — Encounter: Payer: Self-pay | Admitting: Internal Medicine

## 2019-10-20 ENCOUNTER — Encounter: Payer: Self-pay | Admitting: Family Medicine

## 2019-10-20 ENCOUNTER — Ambulatory Visit (INDEPENDENT_AMBULATORY_CARE_PROVIDER_SITE_OTHER): Payer: Medicare Other | Admitting: Family Medicine

## 2019-10-20 ENCOUNTER — Other Ambulatory Visit: Payer: Self-pay

## 2019-10-20 VITALS — BP 116/77 | HR 70 | Ht 64.0 in | Wt 125.0 lb

## 2019-10-20 DIAGNOSIS — S92902D Unspecified fracture of left foot, subsequent encounter for fracture with routine healing: Secondary | ICD-10-CM

## 2019-10-20 DIAGNOSIS — S92355D Nondisplaced fracture of fifth metatarsal bone, left foot, subsequent encounter for fracture with routine healing: Secondary | ICD-10-CM | POA: Diagnosis not present

## 2019-10-20 DIAGNOSIS — S92352D Displaced fracture of fifth metatarsal bone, left foot, subsequent encounter for fracture with routine healing: Secondary | ICD-10-CM | POA: Diagnosis not present

## 2019-10-20 NOTE — Telephone Encounter (Signed)
Left VM for patient. If she calls back please have her speak with a nurse/CMA and inform that her xray shows healing. Can still keep trying to stop using the boot.   If any questions then please take the best time and phone number to call and I will try to call her back.   Rosemarie Ax, MD [facility] 10/20/2019, 4:58 PM

## 2019-10-20 NOTE — Patient Instructions (Signed)
Good to see you Please try to come out of the boot if no pain  Please continue ice Physical therapy will call you  I will call about the xray results   Please send me a message in MyChart with any questions or updates.  Please see me back in 4-6 weeks.   --Dr. Raeford Razor

## 2019-10-20 NOTE — Assessment & Plan Note (Signed)
Pain has improved and no significant pain to palpation.  Injury occurred on 11/10. -Counseled on trying to wean out of the cam walker. -Counseled on supportive care. -X-ray.

## 2019-10-20 NOTE — Progress Notes (Signed)
Brittany Myers - 69 y.o. female MRN [id number]  Date of birth: 07-03-1951  SUBJECTIVE:  Including CC & ROS.  Chief Complaint  Patient presents with  . Follow-up    follow up for left foot    Brittany Myers is a 69 y.o. female that is following up for her left foot fracture in bone contusions.  Her pain has improved where she can walk without a boot.  The swelling is slowly improved as well.  No significant ecchymosis any further.  Denies any numbness or tingling.  She is roughly 10 weeks from her injury.   Review of Systems See HPI   HISTORY: Past Medical, Surgical, Social, and Family History Reviewed & Updated per EMR.   Pertinent Historical Findings include:  Past Medical History:  Diagnosis Date  . 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 Endometriosis   . Increased homocysteine    high-normal  . Osteopenia    T score ; -2.1  @ L femoral neck in 2006  . Plantar fasciitis     Past Surgical History:  Procedure Laterality Date  . COLONOSCOPY  2013   Dr Sharlett Iles  . HEMORRHOID SURGERY    . LAPAROSCOPIC ENDOMETRIOSIS FULGURATION     5 surgeries; Dr Irven [place], Concha [place]  . POLYPECTOMY  2001    Dr.Sam [place]     Allergies  Allergen Reactions  . Ciprofloxacin     REACTION: red ,itchy feet, "furry throat" Because of a history of documented adverse serious drug reaction;Medi Alert bracelet  is recommended  . Penicillins     REACTION: angioedema Because of a history of documented adverse serious drug reaction;Medi Alert bracelet  is recommended  . Terbinafine Hcl     Angioedema & shock 1999 from oral Lamisil  Because of a history of documented adverse serious drug reaction;Medi Alert bracelet  is recommended  . Erythromycin     REACTION: headaches  . Minocycline Hcl     REACTION: photosensitivity post sun exposure  . Zolpidem Tartrate     REACTION: "weepy"    Family History  Problem Relation Age of Onset  . Hyperlipidemia Brother   . Heart failure Father        heart attack @ 22  after CVA  . Stroke Father 33       hemorrhagic  after MI  . Diabetes Father   . Endometriosis Mother   . Hyperlipidemia Mother   . Osteopenia Mother   . Heart attack Maternal Grandfather 65  . Liver cancer Maternal Aunt   . Colon cancer Paternal Grandmother   . Alzheimer's disease Maternal Grandmother      Social History   Socioeconomic History  . Marital status: Married    Spouse name: Not on file  . Number of children: 0  . Years of education: Not on file  . Highest education level: Not on file  Occupational History  . Occupation: Human resources officer  Tobacco Use  . Smoking status: Never Smoker  . Smokeless tobacco: Never Used  Substance and Sexual Activity  . Alcohol use: Yes    Comment:   3/ week  . Drug use: No  . Sexual activity: Yes  Other Topics Concern  . Not on file  Social History Narrative   Heart healthy diet   Regular Exercise-no   Social Determinants of Health   Financial Resource Strain:   . Difficulty of Paying Living Expenses: Not on file  Food Insecurity:   . Worried About  Running Out of Food in the Last Year: Not on file  . Ran Out of Food in the Last Year: Not on file  Transportation Needs:   . Lack of Transportation (Medical): Not on file  . Lack of Transportation (Non-Medical): Not on file  Physical Activity: Sufficiently Active  . Days of Exercise per Week: 5 days  . Minutes of Exercise per Session: 50 min  Stress: No Stress Concern Present  . Feeling of Stress : Only a little  Social Connections:   . Frequency of Communication with Friends and Family: Not on file  . Frequency of Social Gatherings with Friends and Family: Not on file  . Attends Religious Services: Not on file  . Active Member of Clubs or Organizations: Not on file  . Attends Archivist Meetings: Not on file  . Marital Status: Not on file  Intimate Partner Violence:   . Fear of Current or Ex-Partner: Not on file  . Emotionally Abused: Not  on file  . Physically Abused: Not on file  . Sexually Abused: Not on file     PHYSICAL EXAM:  VS: BP 116/77   Pulse 70   Ht 5\' 4"  (1.626 m)   Wt 125 lb (56.7 kg)   BMI 21.46 kg/m  Physical Exam Gen: NAD, alert, cooperative with exam, well-appearing ENT: normal lips, normal nasal mucosa,  Eye: normal EOM, normal conjunctiva and lids Skin: no rashes, no areas of induration  Neuro: normal tone, normal sensation to touch Psych:  normal insight, alert and oriented MSK:  Left foot: Edema still present but improved from previously. No significant tenderness to palpation over the base of the fifth metatarsal. No tenderness to palpation over the calcaneus or the cuboid. Neurovascularly intact     ASSESSMENT & PLAN:   Nondisplaced fracture of fifth metatarsal bone, left foot, subsequent encounter for fracture with routine healing Pain has improved and no significant pain to palpation.  Injury occurred on 11/10. -Counseled on trying to wean out of the cam walker. -Counseled on supportive care. -X-ray.   Foot fracture, left Pain has improved throughout the foot. Mobilization would likely help with the multiple trauma.  - referral to physical therapy.  - counseled on supportive care.

## 2019-10-20 NOTE — Assessment & Plan Note (Signed)
Pain has improved throughout the foot. Mobilization would likely help with the multiple trauma.  - referral to physical therapy.  - counseled on supportive care.

## 2019-10-24 ENCOUNTER — Encounter: Payer: Self-pay | Admitting: Internal Medicine

## 2019-10-24 DIAGNOSIS — R6889 Other general symptoms and signs: Secondary | ICD-10-CM

## 2019-10-25 DIAGNOSIS — M810 Age-related osteoporosis without current pathological fracture: Secondary | ICD-10-CM | POA: Diagnosis not present

## 2019-10-25 DIAGNOSIS — L900 Lichen sclerosus et atrophicus: Secondary | ICD-10-CM | POA: Diagnosis not present

## 2019-10-25 DIAGNOSIS — Z779 Other contact with and (suspected) exposures hazardous to health: Secondary | ICD-10-CM | POA: Diagnosis not present

## 2019-10-25 DIAGNOSIS — N952 Postmenopausal atrophic vaginitis: Secondary | ICD-10-CM | POA: Diagnosis not present

## 2019-10-31 ENCOUNTER — Encounter: Payer: Self-pay | Admitting: Internal Medicine

## 2019-10-31 DIAGNOSIS — R269 Unspecified abnormalities of gait and mobility: Secondary | ICD-10-CM | POA: Diagnosis not present

## 2019-11-03 DIAGNOSIS — R269 Unspecified abnormalities of gait and mobility: Secondary | ICD-10-CM | POA: Diagnosis not present

## 2019-11-07 DIAGNOSIS — R269 Unspecified abnormalities of gait and mobility: Secondary | ICD-10-CM | POA: Diagnosis not present

## 2019-11-11 DIAGNOSIS — R269 Unspecified abnormalities of gait and mobility: Secondary | ICD-10-CM | POA: Diagnosis not present

## 2019-11-15 DIAGNOSIS — R269 Unspecified abnormalities of gait and mobility: Secondary | ICD-10-CM | POA: Diagnosis not present

## 2019-11-24 ENCOUNTER — Encounter: Payer: Self-pay | Admitting: Family Medicine

## 2019-11-25 DIAGNOSIS — R269 Unspecified abnormalities of gait and mobility: Secondary | ICD-10-CM | POA: Diagnosis not present

## 2019-11-28 ENCOUNTER — Ambulatory Visit: Payer: Medicare Other | Admitting: Pharmacist

## 2019-11-28 ENCOUNTER — Other Ambulatory Visit: Payer: Self-pay

## 2019-11-28 DIAGNOSIS — G479 Sleep disorder, unspecified: Secondary | ICD-10-CM

## 2019-11-28 DIAGNOSIS — L280 Lichen simplex chronicus: Secondary | ICD-10-CM

## 2019-11-28 DIAGNOSIS — M810 Age-related osteoporosis without current pathological fracture: Secondary | ICD-10-CM

## 2019-11-28 DIAGNOSIS — E782 Mixed hyperlipidemia: Secondary | ICD-10-CM

## 2019-11-28 NOTE — Chronic Care Management (AMB) (Signed)
Chronic Care Management Pharmacy  Name: Brittany Myers  MRN: [id number] DOB: [birth year]    [name] was given information about Chronic Care Management services today including:  1. CCM service includes personalized support from designated clinical staff supervised by her physician, including individualized plan of care and coordination with other care providers 2. 24/7 contact phone numbers for assistance for urgent and routine care needs. 3. Service will only be billed when office clinical staff spend 20 minutes or more in a month to coordinate care. 4. Only one practitioner may furnish and bill the service in a calendar month. 5. The patient may stop CCM services at any time (effective at the end of the month) by phone call to the office staff. 6. The patient will be responsible for cost sharing (co-pay) of up to 20% of the service fee (after annual deductible is met).  Patient agreed to services and verbal consent obtained.    Chief Complaint/ HPI  Brittany Myers,  69 y.o. , female presents for their Initial CCM visit with the clinical pharmacist via telephone due to COVID-19 Pandemic.  PCP : Binnie Rail, MD  Their chronic conditions include: HLD, osteoporosis, GERD, anxiety/sleep disorder  Office Visits: 06/28/19 Dr Quay Burow OV: c/o fatigue, uses lorazepam every 3rd night. Rec'd more regular exercise. May consider trazodone.  Consult Visit: 10/20/19 Dr Raeford Razor (sports medicine): L toe fracture 08/09/19, pain improving, rec'd mobilization and PT.   Medications: Outpatient Encounter Medications as of 11/28/2019  Medication Sig  . acetaminophen (TYLENOL) 500 MG tablet Take 500 mg by mouth every 6 (six) hours as needed.  Marland Kitchen alendronate (FOSAMAX) 70 MG tablet Take 70 mg by mouth once a week. Take with a full glass of water on an empty stomach.  . AMBULATORY NON FORMULARY MEDICATION Rolling Knee Scooter, please take Rx to medical supply store.  . clobetasol cream (TEMOVATE) 8.11 %  Apply 1 application topically as needed.  Marland Kitchen EPINEPHrine 0.3 mg/0.3 mL IJ SOAJ injection Inject 0.3 mLs (0.3 mg total) into the muscle as needed.  Marland Kitchen estradiol (ESTRACE) 0.1 MG/GM vaginal cream Place 1 Applicatorful vaginally 2 (two) times a week.   Marland Kitchen LORazepam (ATIVAN) 0.5 MG tablet take 1 tablet by mouth every 8 hours if needed for anxiety  . Menaquinone-7 (VITAMIN K2) 100 MCG CAPS Take 1 tablet by mouth daily.  . Multiple Minerals-Vitamins (CALCIUM CITRATE +) TABS Taking daily  . Multiple Vitamin (MULTIVITAMIN) capsule Take 1 capsule by mouth daily.   No facility-administered encounter medications on file as of 11/28/2019.    Current Diagnosis/Assessment:  Goals Addressed            This Visit's Progress   . Pharmacy Care Plan       Current Barriers:  . Chronic Disease Management support, education, and care coordination needs related to HLD and osteoporosis, postmenopausal syndrome  Pharmacist Clinical Goal(s):  Marland Kitchen Maintain diet and exercise routine to prevent disease . Ensure safety, efficacy, and affordability of medications . Improve pharmacy experience  Interventions: . Comprehensive medication review performed. . Call [facility] [phone]) to stop automated calls . Call clinical pharmacist with any questions or concerns  Patient Self Care Activities:  . Self administers medications as prescribed, Calls pharmacy for medication refills, and Calls provider office for new concerns or questions  Initial goal documentation       Hyperlipidemia   Lipid Panel     Component Value Date/Time   CHOL 202 (H) 05/21/2018 0923   TRIG 90.0  05/21/2018 0923   HDL 68.50 05/21/2018 0923   CHOLHDL 3 05/21/2018 0923   VLDL 18.0 05/21/2018 0923   LDLCALC 115 (H) 05/21/2018 0923     ASCVD 10-year risk: 6.0%  Patient has failed these meds in past: n/a Patient is currently controlled on the following medications: no meds  We discussed:  diet and exercise extensively.  Diet has worsened a bit due to COVID - not always able to get what she wants at the grocery store. Also broke her foot in November and has not been able to get to grocery store or exercise as much.  Plan  Continue control with diet and exercise    Anxiety/Sleep disorder   Patient has failed these meds in past: doxepin,  Patient is currently controlled on the following medications: lorazepam 0.5 mg q8h prn - patient takes infrequently  We discussed: Pt is sleeping better recently, she was able to link sleep issues to red wine. If she drinks more than 3-4 oz, she wakes up in middle of night sweating. She is scared of lorazepam side effects and dependence, discussed taking it PRN like she does puts her at very low risk of poor outcomes.  Plan  Continue current medications  Osteoporosis   Patient has failed these meds in past: n/a Patient is currently controlled on the following medications: alendronate 70 mg weekly - started about a month ago.  We discussed: proper administration of med, including taking with full glass of water and remaining upright for at least 60 min after. Pt actually takes it with 16 oz of water to be extra careful, she takes it on Saturdays and has not had any issues so far.  Plan  Continue current medications   Postmenopausal symptoms   Patient has failed these meds in past: n/a Patient is currently controlled on the following medications: estradiol 0.1 mg vaginal cream twice a week, clobetasol cream as needed  We discussed:  Estradiol cream is the most expensive med she takes, typically between $50-100 depending on time of year, whether she uses insurance or GoodRx card. Pt is happy to pay for the cream since she fills it a few times a year and she wants to avoid systemic exposure to hormone via cheaper options like pills.  Plan  Continue current medications    Health Maintenance   Patient is currently controlled on the following medications:  multivitamin, calcium+magnesium+vitamin D, Vitamin K2  We discussed:  Pt is doing well on OTC meds. She plans to stop taking K2 once her foot heals.  Plan  Continue current medications   Medication Management   [facility] Does not pill box - does not take any Rx meds on a daily basis Pt endorses 100% compliance  We discussed: Pt is interested in using [facility] for medication delivery, however it is not a preferred pharmacy with her insurance so [facility] will [facility]. Pt does not like the automated phone calls she receives from [facility], provided her with phone number to request removing her number from robo-calls.  Pt used to take triamterene/HCTZ for fluid in her ear, her ENT moved so she has not refilled in many months. She noticed the med did help with her ear/hearing, as well as reduced swelling in her fingers. Pt did receive a call but did not schedule with ENT yet.  Plan  Continue current med management strategy Pt to make ENT appointment    Follow up: 6 month phone visit  Charlene Brooke, PharmD Clinical Pharmacist [facility] at [facility] [phone]

## 2019-11-28 NOTE — Patient Instructions (Addendum)
Visit Information  Thank you for meeting with me to discuss your medications! I look forward to working with you to achieve your health care goals. Below is a summary of what we talked about during the visit:  Goals Addressed            This Visit's Progress   . Pharmacy Care Plan       Current Barriers:  . Chronic Disease Management support, education, and care coordination needs related to HLD and osteoporosis, postmenopausal syndrome  Pharmacist Clinical Goal(s):  Marland Kitchen Maintain diet and exercise routine to prevent disease . Ensure safety, efficacy, and affordability of medications . Improve pharmacy experience  Interventions: . Comprehensive medication review performed. . Call [facility] [phone]) to stop automated calls . Call clinical pharmacist with any questions or concerns . Schedule appointment with [facility]NT for ear issues  Patient Self Care Activities:  . Self administers medications as prescribed, Calls pharmacy for medication refills, and Calls provider office for new concerns or questions  Initial goal documentation      Brittany Myers was given information about Chronic Care Management services today including:  1. CCM service includes personalized support from designated clinical staff supervised by her physician, including individualized plan of care and coordination with other care providers 2. 24/7 contact phone numbers for assistance for urgent and routine care needs. 3. Service will only be billed when office clinical staff spend 20 minutes or more in a month to coordinate care. 4. Only one practitioner may furnish and bill the service in a calendar month. 5. The patient may stop CCM services at any time (effective at the end of the month) by phone call to the office staff. 6. The patient will be responsible for cost sharing (co-pay) of up to 20% of the service fee (after annual deductible is met).  Patient agreed to services and verbal consent  obtained.   The patient verbalized understanding of instructions provided today and agreed to receive a mailed copy of patient instruction and/or educational materials. Telephone follow up appointment with pharmacy team member scheduled for:    Charlene Brooke, PharmD Clinical Pharmacist [facility] [phone]   Preventing High Cholesterol Cholesterol is a white, waxy substance similar to fat that the human body needs to help build cells. The liver makes all the cholesterol that a person's body needs. Having high cholesterol (hypercholesterolemia) increases a person's risk for heart disease and stroke. Extra (excess) cholesterol comes from the food the person eats. High cholesterol can often be prevented with diet and lifestyle changes. If you already have high cholesterol, you can control it with diet and lifestyle changes and with medicine. How can high cholesterol affect me? If you have high cholesterol, deposits (plaques) may build up on the walls of your arteries. The arteries are the blood vessels that carry blood away from your heart. Plaques make the arteries narrower and stiffer. This can limit or block blood flow and cause blood clots to form. Blood clots:  Are tiny balls of cells that form in your blood.  Can move to the heart or brain, causing a heart attack or stroke. Plaques in arteries greatly increase your risk for heart attack and stroke.Making diet and lifestyle changes can reduce your risk for these conditions that may threaten your life. What can increase my risk? This condition is more likely to develop in people who:  Eat foods that are high in saturated fat or cholesterol. Saturated fat is mostly found in: ?  Foods that contain animal fat, such as red meat and some dairy products. ? Certain fatty foods made from plants, such as tropical oils.  Are overweight.  Are not getting enough exercise.  Have a family history of high  cholesterol. What actions can I take to prevent this? Nutrition   Eat less saturated fat.  Avoid trans fats (partially hydrogenated oils). These are often found in margarine and in some baked goods, fried foods, and snacks bought in packages.  Avoid precooked or cured meat, such as sausages or meat loaves.  Avoid foods and drinks that have added sugars.  Eat more fruits, vegetables, and whole grains.  Choose healthy sources of protein, such as fish, poultry, lean cuts of red meat, beans, peas, lentils, and nuts.  Choose healthy sources of fat, such as: ? Nuts. ? Vegetable oils, especially olive oil. ? Fish that have healthy fats (omega-3 fatty acids), such as mackerel or salmon. The items listed above may not be a complete list of recommended foods and beverages. Contact a dietitian for more information. Lifestyle  Lose weight if you are overweight. Losing 5-10 lb (2.3-4.5 kg) can help prevent or control high cholesterol. It can also lower your risk for diabetes and high blood pressure. Ask your health care provider to help you with a diet and exercise plan to lose weight safely.  Do not use any products that contain nicotine or tobacco, such as cigarettes, e-cigarettes, and chewing tobacco. If you need help quitting, ask your health care provider.  Limit your alcohol intake. ? Do not drink alcohol if:  Your health care provider tells you not to drink.  You are pregnant, may be pregnant, or are planning to become pregnant. ? If you drink alcohol:  Limit how much you use to:  0-1 drink a day for women.  0-2 drinks a day for men.  Be aware of how much alcohol is in your drink. In the [place] one drink equals one 12 oz bottle of beer (355 mL), one 5 oz glass of wine (148 mL), or one 1 oz glass of hard liquor (44 mL). Activity   Get enough exercise. Each week, do at least 150 minutes of exercise that takes a medium level of effort (moderate-intensity exercise). ? This is  exercise that:  Makes your heart beat faster and makes you breathe harder than usual.  Allows you to still be able to talk. ? You could exercise in short sessions several times a day or longer sessions a few times a week. For example, on 5 days each week, you could walk fast or ride your bike 3 times a day for 10 minutes each time.  Do exercises as told by your health care provider. Medicines  In addition to diet and lifestyle changes, your health care provider may recommend medicines to help lower cholesterol. This may be a medicine to lower the amount of cholesterol your liver makes. You may need medicine if: ? Diet and lifestyle changes do not lower your cholesterol enough. ? You have high cholesterol and other risk factors for heart disease or stroke.  Take over-the-counter and prescription medicines only as told by your health care provider. General information  Manage your risk factors for high cholesterol. Talk with your health care provider about all your risk factors and how to lower your risk.  Manage other conditions that you have, such as diabetes or high blood pressure (hypertension).  Have blood tests to check your cholesterol levels at regular points  in time as told by your health care provider.  Keep all follow-up visits as told by your health care provider. This is important. Where to find more information  American Heart Association: www.heart.org  National Heart, Lung, and Blood Institute: https://wilson-eaton.com/ Summary  High cholesterol increases your risk for heart disease and stroke. By keeping your cholesterol level low, you can reduce your risk for these conditions.  High cholesterol can often be prevented with diet and lifestyle changes.  Work with your health care provider to manage your risk factors, and have your blood tested regularly. This information is not intended to replace advice given to you by your health care provider. Make sure you discuss any  questions you have with your health care provider. Document Revised: 01/07/2019 Document Reviewed: 05/24/2016 Elsevier Patient Education  2020 [employer].

## 2019-11-28 NOTE — Progress Notes (Signed)
  I have reviewed this encounter including the documentation in this note and/or discussed this patient with the care management provider. I am certifying that I agree with the content of this note as supervising physician.  Binnie Rail, MD   11/28/2019

## 2019-11-29 DIAGNOSIS — R269 Unspecified abnormalities of gait and mobility: Secondary | ICD-10-CM | POA: Diagnosis not present

## 2019-12-03 NOTE — Addendum Note (Signed)
Addended by: CAIRRIKIER DAVIDSON, Doshia Dalia M on: 12/03/2019 10:17 AM   Modules accepted: Orders  

## 2019-12-05 DIAGNOSIS — R269 Unspecified abnormalities of gait and mobility: Secondary | ICD-10-CM | POA: Diagnosis not present

## 2019-12-09 DIAGNOSIS — R269 Unspecified abnormalities of gait and mobility: Secondary | ICD-10-CM | POA: Diagnosis not present

## 2019-12-12 DIAGNOSIS — R269 Unspecified abnormalities of gait and mobility: Secondary | ICD-10-CM | POA: Diagnosis not present

## 2019-12-27 DIAGNOSIS — R269 Unspecified abnormalities of gait and mobility: Secondary | ICD-10-CM | POA: Diagnosis not present

## 2019-12-29 DIAGNOSIS — R269 Unspecified abnormalities of gait and mobility: Secondary | ICD-10-CM | POA: Diagnosis not present

## 2020-01-09 DIAGNOSIS — R269 Unspecified abnormalities of gait and mobility: Secondary | ICD-10-CM | POA: Diagnosis not present

## 2020-01-13 DIAGNOSIS — R269 Unspecified abnormalities of gait and mobility: Secondary | ICD-10-CM | POA: Diagnosis not present

## 2020-01-20 DIAGNOSIS — R269 Unspecified abnormalities of gait and mobility: Secondary | ICD-10-CM | POA: Diagnosis not present

## 2020-01-23 DIAGNOSIS — L821 Other seborrheic keratosis: Secondary | ICD-10-CM | POA: Diagnosis not present

## 2020-01-23 DIAGNOSIS — D0472 Carcinoma in situ of skin of left lower limb, including hip: Secondary | ICD-10-CM | POA: Diagnosis not present

## 2020-01-23 DIAGNOSIS — Z85828 Personal history of other malignant neoplasm of skin: Secondary | ICD-10-CM | POA: Diagnosis not present

## 2020-01-23 DIAGNOSIS — L570 Actinic keratosis: Secondary | ICD-10-CM | POA: Diagnosis not present

## 2020-01-23 DIAGNOSIS — D485 Neoplasm of uncertain behavior of skin: Secondary | ICD-10-CM | POA: Diagnosis not present

## 2020-01-23 DIAGNOSIS — L812 Freckles: Secondary | ICD-10-CM | POA: Diagnosis not present

## 2020-01-23 DIAGNOSIS — R269 Unspecified abnormalities of gait and mobility: Secondary | ICD-10-CM | POA: Diagnosis not present

## 2020-01-23 DIAGNOSIS — D1801 Hemangioma of skin and subcutaneous tissue: Secondary | ICD-10-CM | POA: Diagnosis not present

## 2020-02-06 DIAGNOSIS — R269 Unspecified abnormalities of gait and mobility: Secondary | ICD-10-CM | POA: Diagnosis not present

## 2020-02-13 DIAGNOSIS — R269 Unspecified abnormalities of gait and mobility: Secondary | ICD-10-CM | POA: Diagnosis not present

## 2020-02-20 ENCOUNTER — Encounter: Payer: Self-pay | Admitting: Internal Medicine

## 2020-02-21 DIAGNOSIS — R269 Unspecified abnormalities of gait and mobility: Secondary | ICD-10-CM | POA: Diagnosis not present

## 2020-02-21 MED ORDER — LORAZEPAM 0.5 MG PO TABS: ORAL_TABLET | ORAL | 2 refills | Status: DC

## 2020-02-22 ENCOUNTER — Encounter: Payer: Self-pay | Admitting: Internal Medicine

## 2020-02-29 DIAGNOSIS — R269 Unspecified abnormalities of gait and mobility: Secondary | ICD-10-CM | POA: Diagnosis not present

## 2020-03-01 ENCOUNTER — Encounter: Payer: Self-pay | Admitting: Family Medicine

## 2020-03-02 ENCOUNTER — Other Ambulatory Visit: Payer: Self-pay | Admitting: Family Medicine

## 2020-03-02 DIAGNOSIS — S92902G Unspecified fracture of left foot, subsequent encounter for fracture with delayed healing: Secondary | ICD-10-CM

## 2020-03-02 DIAGNOSIS — S92355D Nondisplaced fracture of fifth metatarsal bone, left foot, subsequent encounter for fracture with routine healing: Secondary | ICD-10-CM

## 2020-03-02 NOTE — Progress Notes (Signed)
Patient with ongoing pain and swelling after fracture.  Concern for complex regional pain syndrome.  Need to rule other possible contributions.   Rosemarie Ax, MD [facility] 03/02/2020, 12:35 PM

## 2020-03-08 ENCOUNTER — Other Ambulatory Visit: Payer: Self-pay

## 2020-03-08 ENCOUNTER — Ambulatory Visit
Admission: RE | Admit: 2020-03-08 | Discharge: 2020-03-08 | Disposition: A | Discharge status: Home / Self Care | Payer: Medicare Other | Source: Ambulatory Visit | Attending: Family Medicine | Admitting: Family Medicine

## 2020-03-08 ENCOUNTER — Other Ambulatory Visit: Payer: Medicare Other

## 2020-03-08 DIAGNOSIS — S92902G Unspecified fracture of left foot, subsequent encounter for fracture with delayed healing: Secondary | ICD-10-CM

## 2020-03-08 DIAGNOSIS — S92355D Nondisplaced fracture of fifth metatarsal bone, left foot, subsequent encounter for fracture with routine healing: Secondary | ICD-10-CM

## 2020-03-08 DIAGNOSIS — M7989 Other specified soft tissue disorders: Secondary | ICD-10-CM | POA: Diagnosis not present

## 2020-03-08 DIAGNOSIS — M79672 Pain in left foot: Secondary | ICD-10-CM | POA: Diagnosis not present

## 2020-03-09 ENCOUNTER — Encounter: Payer: Self-pay | Admitting: Family Medicine

## 2020-03-12 ENCOUNTER — Telehealth (INDEPENDENT_AMBULATORY_CARE_PROVIDER_SITE_OTHER): Payer: Medicare Other | Admitting: Family Medicine

## 2020-03-12 ENCOUNTER — Other Ambulatory Visit: Payer: Self-pay

## 2020-03-12 DIAGNOSIS — S92355D Nondisplaced fracture of fifth metatarsal bone, left foot, subsequent encounter for fracture with routine healing: Secondary | ICD-10-CM

## 2020-03-12 NOTE — Assessment & Plan Note (Signed)
MRI was showing nonspecific findings that could be related to her foot and ankle pain.  She may be having some overuse as to getting back to doing normal things that is contributing to her pain.  Possible for some peroneal brevis irritation. -Counseled on home exercise therapy and supportive care. -Could consider custom orthotics to help with stability and impact

## 2020-03-12 NOTE — Progress Notes (Addendum)
Virtual Visit via Video Note  I connected with Brittany Myers on 03/12/20 at  8:10 AM EDT by a video enabled telemedicine application and verified that I am speaking with the correct person using two identifiers.   I discussed the limitations of evaluation and management by telemedicine and the availability of in person appointments. The patient expressed understanding and agreed to proceed.  Patient: home  Physician: office.   History of Present Illness:  Brittany Myers is a 69 yo F that is presenting with left ankle and foot pain. An MRI of her foot was showing a small amount of fluid overlying the dorsal lateral aspect of the fifth metatarsal.  Possible for early stress-related change versus soft tissue contusion.  It showed a healed nondisplaced avulsion type fracture of the base of the fifth metatarsal.   Observations/Objective:  Gen: NAD, alert, cooperative with exam, well-appearing    Assessment and Plan:  History of nondisplaced fracture of the base of the fifth metatarsal on the left foot: MRI was showing nonspecific findings that could be related to her foot and ankle pain.  She may be having some overuse as to getting back to doing normal things that is contributing to her pain.  Possible for some peroneal brevis irritation. -Counseled on home exercise therapy and supportive care. -Could consider custom orthotics to help with stability and impact  Follow Up Instructions:    I discussed the assessment and treatment plan with the patient. The patient was provided an opportunity to ask questions and all were answered. The patient agreed with the plan and demonstrated an understanding of the instructions.   The patient was advised to call back or seek an in-person evaluation if the symptoms worsen or if the condition fails to improve as anticipated.    Clearance Coots, MD

## 2020-03-15 ENCOUNTER — Ambulatory Visit: Payer: Self-pay

## 2020-03-15 ENCOUNTER — Encounter: Payer: Self-pay | Admitting: Family Medicine

## 2020-03-15 ENCOUNTER — Ambulatory Visit (INDEPENDENT_AMBULATORY_CARE_PROVIDER_SITE_OTHER): Payer: Medicare Other | Admitting: Family Medicine

## 2020-03-15 ENCOUNTER — Other Ambulatory Visit: Payer: Self-pay

## 2020-03-15 VITALS — BP 115/67 | HR 69 | Ht 64.0 in | Wt 123.0 lb

## 2020-03-15 DIAGNOSIS — M79672 Pain in left foot: Secondary | ICD-10-CM

## 2020-03-15 DIAGNOSIS — M25572 Pain in left ankle and joints of left foot: Secondary | ICD-10-CM

## 2020-03-15 NOTE — Progress Notes (Signed)
Brittany Myers - 69 y.o. female MRN [id number]  Date of birth: 08-08-1951  SUBJECTIVE:  Including CC & ROS.  Chief Complaint  Patient presents with  . Foot Orthotics    Brittany Myers is a 69 y.o. female that is following up for her left foot pain.  She had a foot fracture last November.  Has been having pain over the lateral aspect.  Recent MRI did not show recurrence of the fracture.   Review of Systems See HPI   HISTORY: Past Medical, Surgical, Social, and Family History Reviewed & Updated per EMR.   Pertinent Historical Findings include:  Past Medical History:  Diagnosis Date  . 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 Endometriosis   . Increased homocysteine    high-normal  . Osteopenia    T score ; -2.1  @ L femoral neck in 2006  . Plantar fasciitis     Past Surgical History:  Procedure Laterality Date  . COLONOSCOPY  2013   Dr Sharlett Iles  . HEMORRHOID SURGERY    . LAPAROSCOPIC ENDOMETRIOSIS FULGURATION     5 surgeries; Dr Irven [place], [place]  . POLYPECTOMY  2001    Dr.[place]     Family History  Problem Relation Age of Onset  . Hyperlipidemia Brother   . Heart failure Father        heart attack @ 10 after CVA  . Stroke Father 32       hemorrhagic  after MI  . Diabetes Father   . Endometriosis Mother   . Hyperlipidemia Mother   . Osteopenia Mother   . Heart attack Maternal Grandfather 65  . Liver cancer Maternal Aunt   . Colon cancer Paternal Grandmother   . Alzheimer's disease Maternal Grandmother     Social History   Socioeconomic History  . Marital status: Married    Spouse name: Not on file  . Number of children: 0  . Years of education: Not on file  . Highest education level: Not on file  Occupational History  . Occupation: Human resources officer  Tobacco Use  . Smoking status: Never Smoker  . Smokeless tobacco: Never Used  Vaping Use  . Vaping Use: Never used  Substance and Sexual Activity  . Alcohol use: Yes    Comment:   3/ week    . Drug use: No  . Sexual activity: Yes  Other Topics Concern  . Not on file  Social History Narrative   Heart healthy diet   Regular Exercise-no   Social Determinants of Health   Financial Resource Strain:   . Difficulty of Paying Living Expenses:   Food Insecurity:   . Worried About Charity fundraiser in the Last Year:   . Arboriculturist in the Last Year:   Transportation Needs:   . Film/video editor (Medical):   Marland Kitchen Lack of Transportation (Non-Medical):   Physical Activity: Sufficiently Active  . Days of Exercise per Week: 5 days  . Minutes of Exercise per Session: 50 min  Stress: No Stress Concern Present  . Feeling of Stress : Only a little  Social Connections:   . Frequency of Communication with Friends and Family:   . Frequency of Social Gatherings with Friends and Family:   . Attends Religious Services:   . Active Member of Clubs or Organizations:   . Attends Archivist Meetings:   Marland Kitchen Marital Status:   Intimate Partner Violence:   . Fear of Current  or Ex-Partner:   . Emotionally Abused:   Marland Kitchen Physically Abused:   . Sexually Abused:      PHYSICAL EXAM:  VS: BP 115/67   Pulse 69   Ht 5\' 4"  (1.626 m)   Wt 123 lb (55.8 kg)   BMI 21.11 kg/m  Physical Exam Gen: NAD, alert, cooperative with exam, well-appearing MSK:  Left foot: No ecchymosis or swelling. Tenderness with manipulation of the cuboid. No tenderness palpation at the base of the fifth. Normal strength to resistance Normal ankle range of motion Neurovascularly intact  Limited ultrasound: Left foot and ankle:  No effusion ankle joint. Normal-appearing peroneal tendons at the lateral malleolus. Normal insertion of the peroneal brevis into the base of the fifth. No acute fracture at the base of the fifth.  Healed fracture was appreciated with no hyperemia. No changes of the metatarsal shaft of the fifth  Summary: No acute changes appreciated  Ultrasound and interpretation by  Clearance Coots, MD   Patient was fitted for a standard, cushioned, semi-rigid orthotic. The orthotic was heated and afterward the patient stood on the orthotic blank positioned on the orthotic stand. The patient was positioned in subtalar neutral position and 10 degrees of ankle dorsiflexion in a weight bearing stance. After completion of molding, a stable base was applied to the orthotic blank. The blank was ground to a stable position for weight bearing. Size: 9M Pairs: 2 Base: Blue EVA Additional Posting and Padding: None The patient ambulated these, and they were very comfortable.   ASSESSMENT & PLAN:   Left foot pain Pain is occurring over the cuboid.  The tissue in this area seems less responsive than on the right foot.  She may have some fixation of the cuboid from this initial injury.  MRI was reassuring. -Counseled on home exercise therapy and supportive care. -Orthotics. -Could consider injection.

## 2020-03-15 NOTE — Patient Instructions (Signed)
Good to see you Please try the exercises  Please let me know if the orthotics cause any problems. We can make adjustments if needed  Please send me a message in MyChart with any questions or updates.  Please see [place] back as needed.   --Dr. Raeford Razor

## 2020-03-15 NOTE — Assessment & Plan Note (Signed)
Pain is occurring over the cuboid.  The tissue in this area seems less responsive than on the right foot.  She may have some fixation of the cuboid from this initial injury.  MRI was reassuring. -Counseled on home exercise therapy and supportive care. -Orthotics. -Could consider injection.

## 2020-03-26 ENCOUNTER — Ambulatory Visit: Payer: Self-pay | Admitting: Pharmacist

## 2020-03-26 DIAGNOSIS — E782 Mixed hyperlipidemia: Secondary | ICD-10-CM

## 2020-03-26 DIAGNOSIS — M810 Age-related osteoporosis without current pathological fracture: Secondary | ICD-10-CM

## 2020-03-26 NOTE — Chronic Care Management (AMB) (Signed)
Chronic Care Management Pharmacy  Name: Brittany Myers  MRN: [id number] DOB: 03-Dec-1950    Chief Complaint/ HPI  Brittany Myers,  69 y.o. , female presents for their Follow-Up CCM visit with the clinical pharmacist via telephone due to COVID-19 Pandemic.  PCP : Binnie Rail, MD  Their chronic conditions include: HLD, osteoporosis, GERD, anxiety/sleep disorder  Pt lost her mother in March unexpectedly due to aneurysm.  Office Visits: 06/28/19 Dr Quay Burow OV: c/o fatigue, uses lorazepam every 3rd night. Rec'd more regular exercise. May consider trazodone.  Consult Visit: 10/20/19 Dr Raeford Razor (sports medicine): L toe fracture 08/09/19, pain improving, rec'd mobilization and PT.   Medications: Outpatient Encounter Medications as of 03/26/2020  Medication Sig  . acetaminophen (TYLENOL) 500 MG tablet Take 500 mg by mouth every 6 (six) hours as needed.  Marland Kitchen alendronate (FOSAMAX) 70 MG tablet Take 70 mg by mouth once a week. Take with a full glass of water on an empty stomach.  . AMBULATORY NON FORMULARY MEDICATION Rolling Knee Scooter, please take Rx to medical supply store.  . clobetasol cream (TEMOVATE) 8.82 % Apply 1 application topically as needed.  Marland Kitchen EPINEPHrine 0.3 mg/0.3 mL IJ SOAJ injection Inject 0.3 mLs (0.3 mg total) into the muscle as needed.  Marland Kitchen estradiol (ESTRACE) 0.1 MG/GM vaginal cream Place 1 Applicatorful vaginally 2 (two) times a week.   Marland Kitchen LORazepam (ATIVAN) 0.5 MG tablet take 1 tablet by mouth every 8 hours if needed for anxiety  . Menaquinone-7 (VITAMIN K2) 100 MCG CAPS Take 1 tablet by mouth daily.  . Multiple Minerals-Vitamins (CALCIUM CITRATE +) TABS Taking daily  . Multiple Vitamin (MULTIVITAMIN) capsule Take 1 capsule by mouth daily.   No facility-administered encounter medications on file as of 03/26/2020.    Current Diagnosis/Assessment:  SDOH Interventions     Most Recent Value  SDOH Interventions  Financial Strain Interventions Intervention Not Indicated       Goals Addressed            This Visit's Progress   . Pharmacy Care Plan       CARE PLAN ENTRY (see longitudinal plan of care for additional care plan information)  Current Barriers:  . Chronic Disease Management support, education, and care coordination needs related to Hyperlipidemia and Osteoporosis    Hyperlipidemia Lab Results  Component Value Date/Time   LDLCALC 115 (H) 05/21/2018 09:23 AM   LDLCALC 123 (H) 07/20/2014 10:37 AM   LDLDIRECT 116.2 05/20/2012 10:29 AM .  Pharmacist Clinical Goal(s): o Over the next 180 days, patient will work with PharmD and providers to achieve LDL goal < 100 . Current regimen:  o No medications . Interventions: o Discussed cholesterol goals and benefits of diet/exercise for prevention of heart attack / stroke . Patient self care activities - Over the next 180 days, patient will: o Focus on diet and exercise routine to lower cholesterol  Osteoporosis . Pharmacist Clinical Goal(s) o Over the next 180 days, patient will work with PharmD and providers to optimize therapy . Current regimen:  o alendronate 70 mg weekly o Calcium-Vitamin D . Interventions: o Discussed optimal administration of alendronate on empty stomach with full glass of water, separated from other medications or food by at least 30 min . Patient self care activities - Over the next 180 days, patient will: o Begin weight-bearing exercises 3 days a week  Medication management . Pharmacist Clinical Goal(s): o Over the next 180 days, patient will work with PharmD and providers to  maintain optimal medication adherence . [facility] . Interventions o Comprehensive medication review performed. o Continue current medication management strategy . Patient self care activities - Over the next 180 days, patient will: o Focus on medication adherence by fill date o Take medications as prescribed o Report any questions or concerns to PharmD and/or  provider(s)  Please see past updates related to this goal by clicking on the "Past Updates" button in the selected goal        Hyperlipidemia   Lipid Panel     Component Value Date/Time   CHOL 202 (H) 05/21/2018 0923   CHOL 206 (H) 07/20/2014 1037   TRIG 90.0 05/21/2018 0923   TRIG 71 07/20/2014 1037   HDL 68.50 05/21/2018 0923   HDL 69 07/20/2014 1037   CHOLHDL 3 05/21/2018 0923   VLDL 18.0 05/21/2018 0923   LDLCALC 115 (H) 05/21/2018 0923   LDLCALC 123 (H) 07/20/2014 1037   LDLDIRECT 116.2 05/20/2012 1029   The 10-year ASCVD risk score Mikey Bussing DC Jr., et al., 2013) is: 5.9%   Values used to calculate the score:     Age: 54 years     Sex: Female     Is Non-Hispanic African American: No     Diabetic: No     Tobacco smoker: No     Systolic Blood Pressure: 518 mmHg     Is BP treated: No     HDL Cholesterol: 68.5 mg/dL     Total Cholesterol: 202 mg/dL  Patient has failed these meds in past: n/a Patient is currently controlled on the following medications: no meds  We discussed:  diet and exercise extensively; cholesterol goals Plan  Continue control with diet and exercise    Anxiety/Sleep disorder   Patient has failed these meds in past: doxepin,  Patient is currently controlled on the following medications:   lorazepam 0.5 mg q8h prn - patient takes infrequently  We discussed: Pt limits red wine to help with sleep; pt worries about lorazepam dependence so uses it sparingly. Denies issues currently.   Plan  Continue current medications   Osteoporosis   Last DEXA Scan: 10/31/2019  T-Score femoral neck: -1.9  T-Score total hip: -2.6  T-Score lumbar spine: -1.8  T-Score forearm radius: n/a  10-year probability of major osteoporotic fracture: n/a  10-year probability of hip fracture: n/a  VITD  Date Value Ref Range Status  07/20/2014 47.80 30.00 - 100.00 ng/mL Final    Patient is a candidate for pharmacologic treatment due to T-Score < -2.5 in total hip    Patient has failed these meds in past: n/a Patient is currently controlled on the following medications:   alendronate 70 mg weekly - started about a month ago  Calcium-Vitamin D  We discussed:  Recommend [phone] units of vitamin D daily. Recommend 1200 mg of calcium daily from dietary and supplemental sources. Recommend weight-bearing and muscle strengthening exercises for building and maintaining bone density.  Plan  Continue current medications and control with diet and exercise  Postmenopausal symptoms   Patient has failed these meds in past: n/a Patient is currently controlled on the following medications:   estradiol 0.1 mg vaginal cream twice a week,   clobetasol cream as needed ~once a week  We discussed:  Estradiol cream is the most expensive med she takes, typically between $50-100 depending on time of year, whether she uses insurance or GoodRx card. Pt is happy to pay for the cream since she fills it a few  times a year and she wants to avoid systemic exposure to hormone via cheaper options like pills.  Plan  Continue current medications   Cochlear hydrops of R ear   Patient has failed these meds in past: triamterene/HCTZ Patient is currently uncontrolled on the following medications:  . No medications  We discussed:  Pt used to take triamterene/HCTZ for fluid in her ear, her ENT moved so she has not refilled in many months. She noticed the med did help with her ear/hearing, as well as reduced swelling in her fingers. Pt did receive a call but did not schedule with ENT yet.  Plan  F/U with ENT  Medication Management   [facility] Does not pill box - does not take any Rx meds on a daily basis Pt endorses 100% compliance  We discussed: Pt is interested in using [facility] for medication delivery, however it is not a preferred pharmacy with her insurance so [facility] will [facility]. Pt does not like the automated phone calls she  receives from [facility], provided her with phone number to request removing her number from robo-calls.  Plan  Continue current med management strategy    Follow up: 6 month phone visit  Charlene Brooke, PharmD Clinical Pharmacist [facility] [phone]

## 2020-03-26 NOTE — Patient Instructions (Addendum)
Visit Information  Phone number for Pharmacist: [phone]  Goals Addressed            This Visit's Progress   . Pharmacy Care Plan       CARE PLAN ENTRY (see longitudinal plan of care for additional care plan information)  Current Barriers:  . Chronic Disease Management support, education, and care coordination needs related to Hyperlipidemia and Osteoporosis    Hyperlipidemia Lab Results  Component Value Date/Time   LDLCALC 115 (H) 05/21/2018 09:23 AM   LDLCALC 123 (H) 07/20/2014 10:37 AM   LDLDIRECT 116.2 05/20/2012 10:29 AM .  Pharmacist Clinical Goal(s): o Over the next 180 days, patient will work with PharmD and providers to achieve LDL goal < 100 . Current regimen:  o No medications . Interventions: o Discussed cholesterol goals and benefits of diet/exercise for prevention of heart attack / stroke . Patient self care activities - Over the next 180 days, patient will: o Focus on diet and exercise routine to lower cholesterol  Osteoporosis . Pharmacist Clinical Goal(s) o Over the next 180 days, patient will work with PharmD and providers to optimize therapy . Current regimen:  o alendronate 70 mg weekly o Calcium-Vitamin D . Interventions: o Discussed optimal administration of alendronate on empty stomach with full glass of water, separated from other medications or food by at least 30 min . Patient self care activities - Over the next 180 days, patient will: o Begin weight-bearing exercises 3 days a week  Medication management . Pharmacist Clinical Goal(s): o Over the next 180 days, patient will work with PharmD and providers to maintain optimal medication adherence . [facility] . Interventions o Comprehensive medication review performed. o Continue current medication management strategy . Patient self care activities - Over the next 180 days, patient will: o Focus on medication adherence by fill date o Take medications as prescribed o Report  any questions or concerns to PharmD and/or provider(s)  Please see past updates related to this goal by clicking on the "Past Updates" button in the selected goal       Patient verbalizes understanding of instructions provided today.   Telephone follow up appointment with pharmacy team member scheduled for: 6 months  Charlene Brooke, PharmD Clinical Pharmacist [facility] [phone]  Eating Plan for Osteoporosis Osteoporosis causes your bones to become weak and brittle. This puts you at greater risk for bone breaks (fractures) from small bumps or falls. Making changes to your diet and increasing your physical activity can help strengthen your bones and improve your overall health. Calcium and vitamin D are nutrients that play an important role in bone health. Vitamin D helps your body use calcium and strengthen bones. Therefore, it is important to get enough calcium and vitamin D as part of your eating plan for osteoporosis. What are tips for following this plan? Reading food labels  Try to get at least 1,000 milligrams (mg) of calcium each day.  Look for foods that have at least 50 mg of calcium per serving.  Talk with your health care provider about taking a calcium supplement if you do not get enough calcium from food.  Do not have more than 2,500 mg of calcium each day. This is the upper limit for food and nutritional supplements combined. Too much calcium may cause constipation and prevent you from absorbing other important nutrients.  Choose foods that contain vitamin D.  Take a daily vitamin supplement that contains 800-1,000 international units (IU)  of vitamin D. The amount may be different depending on your age, body weight, ethnicity, and where you live. Talk with your dietitian or health care provider about how much vitamin D is right for you.  Avoid foods that have more than 300 mg of sodium per serving. Too much sodium can cause your body to  lose calcium.  Talk with your dietitian or health care provider about how much sodium you are allowed each day. Shopping  Do not buy foods with added salt, including: ? Salted snacks. ? Angie Fava. ? Canned soups. ? Canned meats. ? Processed meats, such as bacon or cold cuts. ? Smoked fish. Meal planning  Eat balanced meals that contain protein foods, fruits and vegetables, and foods rich in calcium and vitamin D.  Eat at least 5 servings of fruits and vegetables each day.  Eat 5-6 oz. of lean meat, poultry, fish, eggs, or beans each day. Lifestyle  Do not use any products that contain nicotine or tobacco, such as cigarettes and e-cigarettes. If you need help quitting, ask your health care provider.  If your health care provider recommends that you lose weight: ? Work with a dietitian to develop an eating plan that will help you reach your desired weight goal. ? Exercise for at least 30 minutes a day, 5 or more days a week, or as told by your health care provider.  Work with a physical therapist to develop an exercise plan that includes flexibility, balance, and strength exercises.  If you drink alcohol, limit how much you have. This means: ? 0-1 drink a day for women. ? 0-2 drinks a day for men. ? Be aware of how much alcohol is in your drink. In the [place] one drink equals one typical bottle of beer (12 oz), one-half glass of wine (5 oz), or one shot of hard liquor (1 oz). What foods should I eat? Foods high in calcium   Yogurt. Yogurt with fruit.  Milk. Evaporated skim milk. Dry milk powder.  Calcium-fortified orange juice.  Parmesan cheese. Part-skim ricotta cheese. Natural hard cheese. Cream cheese. Cottage cheese.  Canned sardines. Canned salmon.  Calcium-treated tofu. Calcium-fortified cereal bar. Calcium-fortified cereal. Calcium-fortified graham crackers.  Cooked collard greens. Turnip greens. Broccoli. Kale.  Almonds.  White beans.  Corn tortilla. Foods  high in vitamin D  Cod liver oil. Fatty fish, such as tuna, mackerel, and salmon.  Milk. Fortified soy milk. Fortified fruit juice.  Yogurt. Margarine.  Egg yolks. Foods high in protein  Beef. Lamb. Pork tenderloin.  Chicken breast.  Tuna (canned). Fish fillet.  Tofu.  Soy beans (cooked). Soy patty. Beans (canned or cooked).  Cottage cheese.  Yogurt.  Peanut butter.  Pumpkin seeds. Nuts. Sunflower seeds.  Hard cheese.  Milk or other milk products, such as soy milk. The items listed above may not be a complete list of foods and beverages you can eat. Contact a dietitian for more options. Summary  Calcium and vitamin D are nutrients that play an important role in bone health and are an important part of your eating plan for osteoporosis.  Eat balanced meals that contain protein foods, fruits and vegetables, and foods rich in calcium and vitamin D.  Avoid foods that have more than 300 mg of sodium per serving. Too much sodium can cause your body to lose calcium.  Exercise is an important part of prevention and treatment of osteoporosis. Aim for at least 30 minutes a day, 5 days a week. This information is not  intended to replace advice given to you by your health care provider. Make sure you discuss any questions you have with your health care provider. Document Revised: 11/23/2017 Document Reviewed: 11/23/2017 Elsevier Patient Education  2020 [employer].

## 2020-04-04 ENCOUNTER — Other Ambulatory Visit: Payer: Medicare Other

## 2020-04-26 DIAGNOSIS — Z1231 Encounter for screening mammogram for malignant neoplasm of breast: Secondary | ICD-10-CM | POA: Diagnosis not present

## 2020-05-02 ENCOUNTER — Encounter: Payer: Self-pay | Admitting: Internal Medicine

## 2020-05-03 NOTE — Progress Notes (Signed)
Subjective:    Patient ID: Brittany Myers, female    DOB: 1950-10-04, 69 y.o.   MRN: [id number]  HPI The patient is here for an acute visit.   ? GERD:  She has a cough when laying down.  She had something similar a long time ago and was diagnosed with silent GERD.  She took medication at that time for 6 weeks and change her diet and it went away.  She was concerned that maybe this recurred.  The palpitations and chest tightness also come at night and she was not sure if that was related to silent heartburn or not.  She has not noticed them during the day.   She is grieving the death of her mother.  She can't sleep.  She is eating.  She has had some chest pain, palpations - she has those symptoms when she lays down at night.  She has been exercising and does not have any concerning symptoms with exercising.  She is not sure if she is anxious, depressed or what.  She has had issues with her brother and they are not talking since the death of her mom.   Medications and allergies reviewed with patient and updated if appropriate.  Patient Active Problem List   Diagnosis Date Noted  . Grieving 05/04/2020  . Foot fracture, left 09/15/2019  . Left foot pain 08/19/2019  . Nondisplaced fracture of fifth metatarsal bone, left foot, subsequent encounter for fracture with routine healing 08/09/2019  . Acute pain of left knee 03/11/2019  . Acute pain of right shoulder 03/11/2019  . Cough 10/13/2018  . Rib pain on right side 06/24/2018  . GERD (gastroesophageal reflux disease) 07/28/2016  . Anxiety 07/28/2016  . Nonspecific abnormal electrocardiogram (ECG) (EKG) 03/11/2016  . Shingles rash 02/13/2016  . 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 Family history of ischemic heart disease 05/20/2011  . Osteoporosis 05/20/2011  . 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 TINNITUS, RIGHT 12/20/2009  . COLONIC POLYPS, BENIGN  02/11/2008    Current Outpatient Medications on File Prior to Visit  Medication Sig Dispense Refill  . acetaminophen (TYLENOL) 500 MG tablet Take 500 mg by mouth every 6 (six) hours as needed.    Marland Kitchen alendronate (FOSAMAX) 70 MG tablet Take 70 mg by mouth once a week. Take with a full glass of water on an empty stomach.    . clobetasol cream (TEMOVATE) 9.48 % Apply 1 application topically as needed.    Marland Kitchen estradiol (ESTRACE) 0.1 MG/GM vaginal cream Place 1 Applicatorful vaginally 2 (two) times a week.     Marland Kitchen LORazepam (ATIVAN) 0.5 MG tablet take 1 tablet by mouth every 8 hours if needed for anxiety 30 tablet 2  . Menaquinone-7 (VITAMIN K2) 100 MCG CAPS Take 1 tablet by mouth daily.    . Multiple Minerals-Vitamins (CALCIUM CITRATE +) TABS Taking daily  0  . Multiple Vitamin (MULTIVITAMIN) capsule Take 1 capsule by mouth daily.     No current facility-administered medications on file prior to visit.    Past Medical History:  Diagnosis Date  . 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 Endometriosis   . Increased homocysteine    high-normal  . Osteopenia    T score ; -2.1  @ L femoral neck in 2006  . Plantar fasciitis     Past Surgical History:  Procedure Laterality Date  . COLONOSCOPY  2013  Dr Sharlett Iles  . HEMORRHOID SURGERY    . LAPAROSCOPIC ENDOMETRIOSIS FULGURATION     5 surgeries; Dr Irven [place], [place]cha [place]  . POLYPECTOMY  2001    Dr.Sam Milbank     Social History   Socioeconomic History  . Marital status: Married    Spouse name: Not on file  . Number of children: 0  . Years of education: Not on file  . Highest education level: Not on file  Occupational History  . Occupation: Human resources officer  Tobacco Use  . Smoking status: Never Smoker  . Smokeless tobacco: Never Used  Vaping Use  . Vaping Use: Never used  Substance and Sexual Activity  . Alcohol use: Yes    Comment:   3/ week  . Drug use: No  . Sexual activity: Yes  Other Topics Concern  . Not on file  Social  History Narrative   Heart healthy diet   Regular Exercise-no   Social Determinants of Health   Financial Resource Strain: Low Risk   . Difficulty of Paying Living Expenses: Not hard at all  Food Insecurity:   . Worried About Charity fundraiser in the Last Year:   . Arboriculturist in the Last Year:   Transportation Needs:   . Film/video editor (Medical):   Marland Kitchen Lack of Transportation (Non-Medical):   Physical Activity: Sufficiently Active  . Days of Exercise per Week: 5 days  . Minutes of Exercise per Session: 50 min  Stress: No Stress Concern Present  . Feeling of Stress : Only a little  Social Connections:   . Frequency of Communication with Friends and Family:   . Frequency of Social Gatherings with Friends and Family:   . Attends Religious Services:   . Active Member of Clubs or Organizations:   . Attends Archivist Meetings:   Marland Kitchen Marital Status:     Family History  Problem Relation Age of Onset  . Hyperlipidemia Brother   . Heart failure Father        heart attack @ 33 after CVA  . Stroke Father 44       hemorrhagic  after MI  . Diabetes Father   . Endometriosis Mother   . Hyperlipidemia Mother   . Osteopenia Mother   . Heart attack Maternal Grandfather 65  . Liver cancer Maternal Aunt   . Colon cancer Paternal Grandmother   . Alzheimer's disease Maternal Grandmother     Review of Systems  Constitutional: Negative for appetite change.  Respiratory: Positive for cough (when laying down). Negative for shortness of breath and wheezing.   Cardiovascular: Positive for chest pain (at night when laying) and palpitations (at night when laying down). Negative for leg swelling.  Gastrointestinal: Negative for abdominal pain and nausea.       No gerd but has h/o silent gerd  Neurological: Positive for headaches. Negative for light-headedness.  Psychiatric/Behavioral: Positive for dysphoric mood and sleep disturbance. The patient is nervous/anxious.          Objective:   Vitals:   05/04/20 0804  BP: 120/78  Pulse: 69  Temp: 97.9 F (36.6 C)  SpO2: 97%   BP Readings from Last 3 Encounters:  05/04/20 120/78  03/15/20 115/67  10/20/19 116/77   Wt Readings from Last 3 Encounters:  05/04/20 126 lb (57.2 kg)  03/15/20 123 lb (55.8 kg)  10/20/19 125 lb (56.7 kg)   Body mass index is 21.63 kg/m.   Physical Exam Constitutional:  General: She is not in acute distress.    Appearance: Normal appearance. She is not ill-appearing.  HENT:     Head: Normocephalic and atraumatic.  Cardiovascular:     Rate and Rhythm: Normal rate and regular rhythm.     Pulses: Normal pulses.  Pulmonary:     Effort: Pulmonary effort is normal. No respiratory distress.     Breath sounds: No wheezing or rales.  Musculoskeletal:     Cervical back: Neck supple. No rigidity or tenderness.     Right lower leg: No edema.     Left lower leg: No edema.  Lymphadenopathy:     Cervical: No cervical adenopathy.  Skin:    General: Skin is warm and dry.  Neurological:     General: No focal deficit present.     Mental Status: She is alert.  Psychiatric:     Comments: Crying intermittently, mixture of anxious and depressed            Assessment & Plan:    See Problem List for Assessment and Plan of chronic medical problems.    This visit occurred during the SARS-CoV-2 public health emergency.  Safety protocols were in place, including screening questions prior to the visit, additional usage of staff PPE, and extensive cleaning of exam room while observing appropriate contact time as indicated for disinfecting solutions.

## 2020-05-04 ENCOUNTER — Ambulatory Visit (INDEPENDENT_AMBULATORY_CARE_PROVIDER_SITE_OTHER): Payer: Medicare Other | Admitting: Internal Medicine

## 2020-05-04 ENCOUNTER — Other Ambulatory Visit: Payer: Self-pay

## 2020-05-04 ENCOUNTER — Encounter: Payer: Self-pay | Admitting: Internal Medicine

## 2020-05-04 VITALS — BP 120/78 | HR 69 | Temp 97.9°F | Ht 64.0 in | Wt 126.0 lb

## 2020-05-04 DIAGNOSIS — F419 Anxiety disorder, unspecified: Secondary | ICD-10-CM | POA: Diagnosis not present

## 2020-05-04 DIAGNOSIS — K219 Gastro-esophageal reflux disease without esophagitis: Secondary | ICD-10-CM

## 2020-05-04 DIAGNOSIS — G479 Sleep disorder, unspecified: Secondary | ICD-10-CM | POA: Diagnosis not present

## 2020-05-04 DIAGNOSIS — F4321 Adjustment disorder with depressed mood: Secondary | ICD-10-CM | POA: Diagnosis not present

## 2020-05-04 MED ORDER — FAMOTIDINE 20 MG PO TABS
20.0000 mg | ORAL_TABLET | Freq: Two times a day (BID) | ORAL | 2 refills | Status: DC
Start: 2020-05-04 — End: 2020-07-10

## 2020-05-04 MED ORDER — EPINEPHRINE 0.3 MG/0.3ML IJ SOAJ: 0.3000 mg | INTRAMUSCULAR | 0 refills | Status: DC | PRN

## 2020-05-04 MED ORDER — TRAZODONE HCL 50 MG PO TABS: 25.0000 mg | ORAL_TABLET | Freq: Every evening | ORAL | 3 refills | Status: DC | PRN

## 2020-05-04 NOTE — Patient Instructions (Addendum)
   Medications reviewed and updated.  Changes include :   Take pepcid 20 mg 1-2 times a day.  Take for 4-6 weeks then try stopping.   Start trazodone at night.    Your prescription(s) have been submitted to your pharmacy. Please take as directed and contact our office if you believe you are having problem(s) with the medication(s).     Please call if there is no improvement in your symptoms.

## 2020-05-04 NOTE — Assessment & Plan Note (Signed)
Acute on chronic Ativan not working as well and she is not sleeping Would benefit from better sleep and she feels this will make everything else better or tolerable Trial of trazodone 25-50 mg nightly-she will let me know if this is not effective

## 2020-05-04 NOTE — Assessment & Plan Note (Signed)
Acute on chronic She is experiencing increased anxiety with probably a mixture of depression and grieving Has been taking lorazepam at night, but it does not help with sleep Feels that if her sleep improves the anxiety will improve Deferred a daily SSRI at this time, but this will be Plan B if her symptoms do not improve

## 2020-05-04 NOTE — Assessment & Plan Note (Signed)
Acute History of silent GERD in the past and having similar symptoms to then She is under increased stress, which could be causing some of the symptoms Start famotidine 20 mg 1-2 times daily for 4-6 weeks and then stop She will let me know if her symptoms do not improve

## 2020-05-04 NOTE — Assessment & Plan Note (Signed)
Acute She is grieving the loss of her mother who died suddenly After her death she also had somewhat of a falling out with her brother All of this is caused some increased anxiety and depression At this time she is not sleeping and feels her not sleeping is the biggest issue-defer to daily SSRI at this time, but if everything does not improve she would benefit from a daily SSRI Will start trazodone for sleep

## 2020-05-09 ENCOUNTER — Other Ambulatory Visit: Payer: Self-pay

## 2020-05-09 ENCOUNTER — Ambulatory Visit (INDEPENDENT_AMBULATORY_CARE_PROVIDER_SITE_OTHER): Payer: Medicare Other | Admitting: Otolaryngology

## 2020-05-09 ENCOUNTER — Encounter (INDEPENDENT_AMBULATORY_CARE_PROVIDER_SITE_OTHER): Payer: Self-pay | Admitting: Otolaryngology

## 2020-05-09 VITALS — Temp 97.2°F

## 2020-05-09 DIAGNOSIS — H9311 Tinnitus, right ear: Secondary | ICD-10-CM | POA: Diagnosis not present

## 2020-05-09 DIAGNOSIS — H8101 Meniere's disease, right ear: Secondary | ICD-10-CM

## 2020-05-09 DIAGNOSIS — H6122 Impacted cerumen, left ear: Secondary | ICD-10-CM
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
## 2020-05-28 ENCOUNTER — Other Ambulatory Visit: Payer: Self-pay | Admitting: Internal Medicine

## 2020-05-30 ENCOUNTER — Ambulatory Visit (INDEPENDENT_AMBULATORY_CARE_PROVIDER_SITE_OTHER): Payer: Medicare Other | Admitting: Otolaryngology

## 2020-06-05 ENCOUNTER — Telehealth: Payer: Medicare Other

## 2020-06-14 DIAGNOSIS — Z20828 Contact with and (suspected) exposure to other viral communicable diseases: Secondary | ICD-10-CM | POA: Diagnosis not present

## 2020-06-18 ENCOUNTER — Ambulatory Visit: Payer: Medicare Other | Admitting: Psychology

## 2020-07-10 ENCOUNTER — Other Ambulatory Visit: Payer: Self-pay | Admitting: Internal Medicine

## 2020-07-19 ENCOUNTER — Other Ambulatory Visit: Payer: Self-pay

## 2020-07-19 ENCOUNTER — Ambulatory Visit (INDEPENDENT_AMBULATORY_CARE_PROVIDER_SITE_OTHER): Payer: Medicare Other

## 2020-07-19 DIAGNOSIS — Z23 Encounter for immunization: Secondary | ICD-10-CM

## 2020-08-15 DIAGNOSIS — Z85828 Personal history of other malignant neoplasm of skin: Secondary | ICD-10-CM | POA: Diagnosis not present

## 2020-08-15 DIAGNOSIS — L812 Freckles: Secondary | ICD-10-CM | POA: Diagnosis not present

## 2020-08-15 DIAGNOSIS — L821 Other seborrheic keratosis: Secondary | ICD-10-CM | POA: Diagnosis not present

## 2020-08-15 DIAGNOSIS — D225 Melanocytic nevi of trunk: Secondary | ICD-10-CM | POA: Diagnosis not present

## 2020-08-15 DIAGNOSIS — L570 Actinic keratosis: Secondary | ICD-10-CM | POA: Diagnosis not present

## 2020-08-15 DIAGNOSIS — L738 Other specified follicular disorders: Secondary | ICD-10-CM | POA: Diagnosis not present

## 2020-08-21 DIAGNOSIS — R269 Unspecified abnormalities of gait and mobility: Secondary | ICD-10-CM | POA: Diagnosis not present

## 2020-08-28 DIAGNOSIS — R269 Unspecified abnormalities of gait and mobility: Secondary | ICD-10-CM | POA: Diagnosis not present

## 2020-08-31 ENCOUNTER — Telehealth: Payer: Medicare Other

## 2020-09-03 ENCOUNTER — Telehealth: Payer: Medicare Other

## 2020-09-03 NOTE — Chronic Care Management (AMB) (Deleted)
Chronic Care Management Pharmacy  Name: Brittany Myers  MRN: [id number] DOB: 27-Sep-1951    Chief Complaint/ HPI  Brittany Myers,  69 y.o. , female presents for their Follow-Up CCM visit with the clinical pharmacist via telephone due to COVID-19 Pandemic.  PCP : Binnie Rail, MD  Their chronic conditions include: HLD, osteoporosis, GERD, anxiety/sleep disorder  Pt lost her mother in March unexpectedly due to aneurysm.  Office Visits: 05/04/20 Dr Quay Burow OV: acute visit for ?GERD, grief, sleep. Added trazodone 25-50 mg HS. Deferred daily SSRI. Start famotidine 20 mg 1-2 times x 4-6 wks.  06/28/19 Dr Quay Burow OV: c/o fatigue, uses lorazepam every 3rd night. Rec'd more regular exercise. May consider trazodone.  Consult Visit: 05/09/20 Dr Lucia Gaskins (ENT): cerumen impaction removal.  10/20/19 Dr Raeford Razor (sports medicine): L toe fracture 08/09/19, pain improving, rec'd mobilization and PT.   Medications: Outpatient Encounter Medications as of 09/03/2020  Medication Sig  . acetaminophen (TYLENOL) 500 MG tablet Take 500 mg by mouth every 6 (six) hours as needed.  Marland Kitchen alendronate (FOSAMAX) 70 MG tablet Take 70 mg by mouth once a week. Take with a full glass of water on an empty stomach.  . clobetasol cream (TEMOVATE) 7.09 % Apply 1 application topically as needed.  Marland Kitchen EPINEPHrine 0.3 mg/0.3 mL IJ SOAJ injection Inject 0.3 mLs (0.3 mg total) into the muscle as needed.  Marland Kitchen estradiol (ESTRACE) 0.1 MG/GM vaginal cream Place 1 Applicatorful vaginally 2 (two) times a week.   . famotidine (PEPCID) 20 MG tablet TAKE 1 TABLET BY MOUTH TWICE A DAY  . LORazepam (ATIVAN) 0.5 MG tablet take 1 tablet by mouth every 8 hours if needed for anxiety  . Menaquinone-7 (VITAMIN K2) 100 MCG CAPS Take 1 tablet by mouth daily.  . Multiple Minerals-Vitamins (CALCIUM CITRATE +) TABS Taking daily  . Multiple Vitamin (MULTIVITAMIN) capsule Take 1 capsule by mouth daily.  . traZODone (DESYREL) 50 MG tablet Take 0.5-1 tablets  (25-50 mg total) by mouth at bedtime as needed for sleep.   No facility-administered encounter medications on file as of 09/03/2020.    Current Diagnosis/Assessment:   Goals Addressed   None     Hyperlipidemia   LDL goal < 130  Last lipids Lab Results  Component Value Date   CHOL 202 (H) 05/21/2018   HDL 68.50 05/21/2018   LDLCALC 115 (H) 05/21/2018   LDLDIRECT 116.2 05/20/2012   TRIG 90.0 05/21/2018   CHOLHDL 3 05/21/2018   The 10-year ASCVD risk score Mikey Bussing DC Jr., et al., 2013) is: 7.2%   Values used to calculate the score:     Age: 80 years     Sex: Female     Is Non-Hispanic African American: No     Diabetic: No     Tobacco smoker: No     Systolic Blood Pressure: 628 mmHg     Is BP treated: No     HDL Cholesterol: 68.5 mg/dL     Total Cholesterol: 202 mg/dL  Patient has failed these meds in past: n/a Patient is currently controlled on the following medications: no meds  We discussed:  diet and exercise extensively; cholesterol goals Plan  Continue control with diet and exercise    Anxiety/Sleep disorder   Patient has failed these meds in past: doxepin,  Patient is currently controlled on the following medications:   lorazepam 0.5 mg q8h prn - patient takes infrequently  Trazodone 25-50 mg HS  We discussed: Pt limits red wine to help  with sleep; pt worries about lorazepam dependence so uses it sparingly. Denies issues currently.   Plan  Continue current medications   GERD   Patient has failed these meds in past: *** Patient is currently {CHL Controlled/Uncontrolled:[phone]} on the following medications:  . Famotidine 20 mg 1-2 times daily  We discussed:  ***  Plan  Continue {CHL [facility] Plans:[phone]}   Osteoporosis   Last DEXA Scan: 10/31/2019  T-Score femoral neck: -1.9  T-Score total hip: -2.6  T-Score lumbar spine: -1.8  T-Score forearm radius: n/a  10-year probability of major osteoporotic fracture:  n/a  10-year probability of hip fracture: n/a  VITD  Date Value Ref Range Status  07/20/2014 47.80 30.00 - 100.00 ng/mL Final    Patient is a candidate for pharmacologic treatment due to T-Score < -2.5 in total hip   Patient has failed these meds in past: n/a Patient is currently controlled on the following medications:   alendronate 70 mg weekly - started Spring 2021  Calcium-Vitamin D  We discussed:  Recommend [phone] units of vitamin D daily. Recommend 1200 mg of calcium daily from dietary and supplemental sources. Recommend weight-bearing and muscle strengthening exercises for building and maintaining bone density.  Plan  Continue current medications and control with diet and exercise  Postmenopausal symptoms   Patient has failed these meds in past: n/a Patient is currently controlled on the following medications:   estradiol 0.1 mg vaginal cream twice a week,   clobetasol cream as needed ~once a week  We discussed:  Estradiol cream is the most expensive med she takes, typically between $50-100 depending on time of year, whether she uses insurance or GoodRx card. Pt is happy to pay for the cream since she fills it a few times a year and she wants to avoid systemic exposure to hormone via cheaper options like pills.  Plan  Continue current medications   Cochlear hydrops of R ear   Patient has failed these meds in past: triamterene/HCTZ Patient is currently uncontrolled on the following medications:  . No medications  We discussed:  Pt used to take triamterene/HCTZ for fluid in her ear, her ENT moved so she has not refilled in many months. She noticed the med did help with her ear/hearing, as well as reduced swelling in her fingers. Pt did receive a call but did not schedule with ENT yet.  Plan  F/U with ENT  Medication Management   Pt [facility] for all medications Does not pill box - does not take any Rx meds on a daily basis Pt endorses 100%  compliance  We discussed: Pt is interested in using [facility] for medication delivery, however it is not a preferred pharmacy with her insurance so she will stay with [facility]. Pt does not like the automated phone calls she receives from [facility], provided her with phone number to request removing her number from robo-calls.  Plan  Continue current med management strategy    Follow up: 6 month phone visit  Charlene Brooke, PharmD, [facility] Clinical Pharmacist [facility] [phone]

## 2020-09-04 ENCOUNTER — Encounter: Payer: Self-pay | Admitting: Internal Medicine

## 2020-09-04 ENCOUNTER — Ambulatory Visit (INDEPENDENT_AMBULATORY_CARE_PROVIDER_SITE_OTHER): Payer: Medicare Other | Admitting: Internal Medicine

## 2020-09-04 ENCOUNTER — Other Ambulatory Visit: Payer: Self-pay

## 2020-09-04 DIAGNOSIS — L03012 Cellulitis of left finger: Secondary | ICD-10-CM

## 2020-09-04 MED ORDER — SULFAMETHOXAZOLE-TRIMETHOPRIM 800-160 MG PO TABS: 1.0000 | ORAL_TABLET | Freq: Two times a day (BID) | ORAL | 0 refills | Status: DC

## 2020-09-04 NOTE — Assessment & Plan Note (Signed)
Acute Lateral aspect-started 8 days ago and not getting better Discussed that this does warrant oral antibiotics-start Bactrim DS twice daily x7 days Warm Epson salt soaks Tetanus up-to-date Call if no improvement

## 2020-09-04 NOTE — Progress Notes (Signed)
Subjective:    Patient ID: Brittany Myers, female    DOB: [birth year], 69 y.o.   MRN: [id number]  HPI The patient is here for an acute visit for an infected cut on her finger.  8 days ago she noticed a cut on the lateral aspect of her left ring finger nail.  She is unsure how she did it-it may have been through working around the house or she did have her nails done.  It has not healed since then and she was very concerned.  The area is slightly swollen, red and painful.  The last 2 nights she had a throbbing-like pain and she was not able to sleep.  During the day the pain is not too bad.  She has not had any fevers, numbness or tingling or pus discharge.  There has been some clear discharge.  She did try pouring and alcohol to see if that would help.  She was concerned about becoming septic from a mild infection.   Medications and allergies reviewed with patient and updated if appropriate.  Patient Active Problem List   Diagnosis Date Noted  . Grieving 05/04/2020  . Foot fracture, left 09/15/2019  . Left foot pain 08/19/2019  . Nondisplaced fracture of fifth metatarsal bone, left foot, subsequent encounter for fracture with routine healing 08/09/2019  . Acute pain of left knee 03/11/2019  . Acute pain of right shoulder 03/11/2019  . Cough 10/13/2018  . Rib pain on right side 06/24/2018  . GERD (gastroesophageal reflux disease) 07/28/2016  . Anxiety 07/28/2016  . Nonspecific abnormal electrocardiogram (ECG) (EKG) 03/11/2016  . Shingles rash 02/13/2016  . 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 Family history of ischemic heart disease 05/20/2011  . Osteoporosis 05/20/2011  . 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 TINNITUS, RIGHT 12/20/2009  . COLONIC POLYPS, BENIGN 02/11/2008    Current Outpatient Medications on File Prior to Visit  Medication Sig Dispense Refill  . acetaminophen (TYLENOL) 500 MG tablet  Take 500 mg by mouth every 6 (six) hours as needed.    Marland Kitchen alendronate (FOSAMAX) 70 MG tablet Take 70 mg by mouth once a week. Take with a full glass of water on an empty stomach.    . clobetasol cream (TEMOVATE) 4.17 % Apply 1 application topically as needed.    Marland Kitchen EPINEPHrine 0.3 mg/0.3 mL IJ SOAJ injection Inject 0.3 mLs (0.3 mg total) into the muscle as needed. 2 each 0  . estradiol (ESTRACE) 0.1 MG/GM vaginal cream Place 1 Applicatorful vaginally 2 (two) times a week.     . famotidine (PEPCID) 20 MG tablet TAKE 1 TABLET BY MOUTH TWICE A DAY 60 tablet 2  . LORazepam (ATIVAN) 0.5 MG tablet take 1 tablet by mouth every 8 hours if needed for anxiety 30 tablet 2  . Menaquinone-7 (VITAMIN K2) 100 MCG CAPS Take 1 tablet by mouth daily.    . metroNIDAZOLE (METROGEL) 0.75 % gel Apply 1 application topically at bedtime.    . Multiple Minerals-Vitamins (CALCIUM CITRATE +) TABS Taking daily  0  . Multiple Vitamin (MULTIVITAMIN) capsule Take 1 capsule by mouth daily.     No current facility-administered medications on file prior to visit.    Past Medical History:  Diagnosis Date  . 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 Endometriosis   . Increased homocysteine    high-normal  . Osteopenia    T score ; -2.1  @  L femoral neck in 2006  . Plantar fasciitis     Past Surgical History:  Procedure Laterality Date  . COLONOSCOPY  2013   Dr Sharlett Iles  . HEMORRHOID SURGERY    . LAPAROSCOPIC ENDOMETRIOSIS FULGURATION     5 surgeries; Dr Irven [place], Concha [place]  . POLYPECTOMY  2001    Dr.Sam Taylors     Social History   Socioeconomic History  . Marital status: Married    Spouse name: Not on file  . Number of children: 0  . Years of education: Not on file  . Highest education level: Not on file  Occupational History  . Occupation: Human resources officer  Tobacco Use  . Smoking status: Never Smoker  . Smokeless tobacco: Never Used  Vaping Use  . Vaping Use: Never used  Substance and Sexual  Activity  . Alcohol use: Yes    Comment:   3/ week  . Drug use: No  . Sexual activity: Yes  Other Topics Concern  . Not on file  Social History Narrative   Heart healthy diet   Regular Exercise-no   Social Determinants of Health   Financial Resource Strain: Low Risk   . Difficulty of Paying Living Expenses: Not hard at all  Food Insecurity:   . Worried About Charity fundraiser in the Last Year: Not on file  . Ran Out of Food in the Last Year: Not on file  Transportation Needs:   . Lack of Transportation (Medical): Not on file  . Lack of Transportation (Non-Medical): Not on file  Physical Activity:   . Days of Exercise per Week: Not on file  . Minutes of Exercise per Session: Not on file  Stress:   . Feeling of Stress : Not on file  Social Connections:   . Frequency of Communication with Friends and Family: Not on file  . Frequency of Social Gatherings with Friends and Family: Not on file  . Attends Religious Services: Not on file  . Active Member of Clubs or Organizations: Not on file  . Attends Archivist Meetings: Not on file  . Marital Status: Not on file    Family History  Problem Relation Age of Onset  . Hyperlipidemia Brother   . Heart failure Father        heart attack @ 87 after CVA  . Stroke Father 52       hemorrhagic  after MI  . Diabetes Father   . Endometriosis Mother   . Hyperlipidemia Mother   . Osteopenia Mother   . Heart attack Maternal Grandfather 65  . Liver cancer Maternal Aunt   . Colon cancer Paternal Grandmother   . Alzheimer's disease Maternal Grandmother     Review of Systems Per HPI    Objective:   Vitals:   09/04/20 1455  BP: 112/72  Pulse: 78  Temp: 98.4 F (36.9 C)  SpO2: 99%   BP Readings from Last 3 Encounters:  09/04/20 112/72  05/04/20 120/78  03/15/20 115/67   Wt Readings from Last 3 Encounters:  09/04/20 131 lb 3.2 oz (59.5 kg)  05/04/20 126 lb (57.2 kg)  03/15/20 123 lb (55.8 kg)   Body mass  index is 22.52 kg/m.   Physical Exam Constitutional:      General: She is not in acute distress.    Appearance: Normal appearance. She is not ill-appearing.  HENT:     Head: Normocephalic and atraumatic.  Skin:    General: Skin is warm and  dry.     Comments:  left ring finger lateral aspect of nail there is swelling, mild erythema and tenderness to palpation, minimal amount of clear fluid coming out of side of nail.  Minimal swelling extending down finger.  Normal sensation.  No significant fluctuance that would allow for an incision and drainage  Neurological:     Mental Status: She is alert.            Assessment & Plan:    See Problem List for Assessment and Plan of chronic medical problems.    This visit occurred during the SARS-CoV-2 public health emergency.  Safety protocols were in place, including screening questions prior to the visit, additional usage of staff PPE, and extensive cleaning of exam room while observing appropriate contact time as indicated for disinfecting solutions.

## 2020-09-04 NOTE — Patient Instructions (Signed)
Take the antibiotic as prescribed.     Paronychia Paronychia is an infection of the skin that surrounds a nail. It usually affects the skin around a fingernail, but it may also occur near a toenail. It often causes pain and swelling around the nail. In some cases, a collection of pus (abscess) can form near or under the nail.  This condition may develop suddenly, or it may develop gradually over a longer period. In most cases, paronychia is not serious, and it will clear up with treatment. What are the causes? This condition may be caused by bacteria or a fungus. These germs can enter the body through an opening in the skin, such as a cut or a hangnail. What increases the risk? This condition is more likely to develop in people who:  Get their hands wet often, such as those who work as Designer, industrial/product, bartenders, or nurses.  Bite their fingernails or suck their thumbs.  Trim their nails very short.  Have hangnails or injured fingertips.  Get manicures.  Have diabetes. What are the signs or symptoms? Symptoms of this condition include:  Redness and swelling of the skin near the nail.  Tenderness around the nail when you touch the area.  Pus-filled bumps under the skin at the base and sides of the nail (cuticle).  Fluid or pus under the nail.  Throbbing pain in the area. How is this diagnosed? This condition is diagnosed with a physical exam. In some cases, a sample of pus may be tested to determine what type of bacteria or fungus is causing the condition. How is this treated? Treatment depends on the cause and severity of your condition. If your condition is mild, it may clear up on its own in a few days or after soaking in warm water. If needed, treatment may include:  Antibiotic medicine, if your infection is caused by bacteria.  Antifungal medicine, if your infection is caused by a fungus.  A procedure to drain pus from an abscess.  Anti-inflammatory medicine  (corticosteroids). Follow these instructions at home: Wound care  Keep the affected area clean.  Soak the affected area in warm water, if told to do so by your health care provider. You may be told to do this for 20 minutes, 2-3 times a day.  Keep the area dry when you are not soaking it.  Do not try to drain an abscess yourself.  Follow instructions from your health care provider about how to take care of the affected area. Make sure you: ? Wash your hands with soap and water before you change your bandage (dressing). If soap and water are not available, use hand sanitizer. ? Change your dressing as told by your health care provider.  If you had an abscess drained, check the area every day for signs of infection. Check for: ? Redness, swelling, or pain. ? Fluid or blood. ? Warmth. ? Pus or a bad smell. Medicines   Take over-the-counter and prescription medicines only as told by your health care provider.  If you were prescribed an antibiotic medicine, take it as told by your health care provider. Do not stop taking the antibiotic even if you start to feel better. General instructions  Avoid contact with harsh chemicals.  Do not pick at the affected area. Prevention  To prevent this condition from happening again: ? Wear rubber gloves when washing dishes or doing other tasks that require your hands to get wet. ? Wear gloves if your hands might come  in contact with cleaners or other chemicals. ? Avoid injuring your nails or fingertips. ? Do not bite your nails or tear hangnails. ? Do not cut your nails very short. ? Do not cut your cuticles. ? Use clean nail clippers or scissors when trimming nails. Contact a health care provider if:  Your symptoms get worse or do not improve with treatment.  You have continued or increased fluid, blood, or pus coming from the affected area.  Your finger or knuckle becomes swollen or difficult to move. Get help right away if you  have:  A fever or chills.  Redness spreading away from the affected area.  Joint or muscle pain. Summary  Paronychia is an infection of the skin that surrounds a nail. It often causes pain and swelling around the nail. In some cases, a collection of pus (abscess) can form near or under the nail.  This condition may be caused by bacteria or a fungus. These germs can enter the body through an opening in the skin, such as a cut or a hangnail.  If your condition is mild, it may clear up on its own in a few days. If needed, treatment may include medicine or a procedure to drain pus from an abscess.  To prevent this condition from happening again, wear gloves if doing tasks that require your hands to get wet or to come in contact with chemicals. Also avoid injuring your nails or fingertips. This information is not intended to replace advice given to you by your health care provider. Make sure you discuss any questions you have with your health care provider. Document Revised: 10/02/2017 Document Reviewed: 09/28/2017 Elsevier Patient Education  2020 [employer].

## 2020-09-06 DIAGNOSIS — R269 Unspecified abnormalities of gait and mobility: Secondary | ICD-10-CM | POA: Diagnosis not present

## 2020-09-13 DIAGNOSIS — R269 Unspecified abnormalities of gait and mobility: Secondary | ICD-10-CM | POA: Diagnosis not present

## 2020-10-06 ENCOUNTER — Ambulatory Visit: Payer: Medicare Other | Attending: Internal Medicine

## 2020-10-06 DIAGNOSIS — Z23 Encounter for immunization: Secondary | ICD-10-CM

## 2020-10-06 NOTE — Progress Notes (Signed)
   Covid-19 Vaccination Clinic  Name:  Brittany Myers    MRN: [id number] DOB: [birth year]  10/06/2020  [name] was observed post Covid-19 immunization for 15 minutes without incident. She was provided with Vaccine Information Sheet and instruction to access the V-Safe system.   [name] was instructed to call 911 with any severe reactions post vaccine: Marland Kitchen Difficulty breathing  . Swelling of face and throat  . A fast heartbeat  . A bad rash all over body  . Dizziness and weakness   Immunizations Administered    Name Date Dose VIS Date Route   Moderna Covid-19 Booster Vaccine 10/06/2020 11:41 AM 0.25 mL 07/18/2020 Intramuscular   Manufacturer: Moderna   Lot: [id number]   [place]: [id number]

## 2020-10-18 DIAGNOSIS — R269 Unspecified abnormalities of gait and mobility: Secondary | ICD-10-CM | POA: Diagnosis not present

## 2020-10-22 ENCOUNTER — Other Ambulatory Visit: Payer: Self-pay

## 2020-10-22 ENCOUNTER — Other Ambulatory Visit: Payer: Medicare Other

## 2020-10-22 DIAGNOSIS — Z20822 Contact with and (suspected) exposure to covid-19: Secondary | ICD-10-CM | POA: Diagnosis not present

## 2020-10-23 ENCOUNTER — Encounter: Payer: Self-pay | Admitting: Internal Medicine

## 2020-10-23 LAB — NOVEL CORONAVIRUS, NAA: SARS-CoV-2, NAA: NOT DETECTED

## 2020-10-23 LAB — SARS-COV-2, NAA 2 DAY TAT

## 2020-10-31 ENCOUNTER — Telehealth: Payer: Self-pay | Admitting: Internal Medicine

## 2020-10-31 NOTE — Telephone Encounter (Signed)
Called to schedule AWV with [facility]. But pt's VMB was full so I was unable to LVM.

## 2020-11-07 DIAGNOSIS — R269 Unspecified abnormalities of gait and mobility: Secondary | ICD-10-CM | POA: Diagnosis not present

## 2020-11-22 DIAGNOSIS — R269 Unspecified abnormalities of gait and mobility: Secondary | ICD-10-CM | POA: Diagnosis not present

## 2020-11-23 ENCOUNTER — Ambulatory Visit: Payer: Medicare Other

## 2020-11-27 ENCOUNTER — Ambulatory Visit (INDEPENDENT_AMBULATORY_CARE_PROVIDER_SITE_OTHER): Payer: Medicare Other

## 2020-11-27 ENCOUNTER — Other Ambulatory Visit: Payer: Self-pay

## 2020-11-27 VITALS — BP 110/70 | HR 71 | Temp 98.3°F | Resp 16 | Ht 64.0 in | Wt 130.2 lb

## 2020-11-27 DIAGNOSIS — Z0000 Encounter for general adult medical examination without abnormal findings: Secondary | ICD-10-CM | POA: Diagnosis not present

## 2020-11-27 NOTE — Progress Notes (Signed)
Subjective:   Brittany Myers is a 70 y.o. female who presents for Medicare Annual (Subsequent) preventive examination.  Review of Systems    No ROS. Medicare Wellness Visit. Additional risk factors are reflected in social history. Cardiac Risk Factors include: advanced age (>45men, >36 women);family history of premature cardiovascular disease     Objective:    Today's Vitals   11/27/20 1226 11/27/20 1233  BP: 110/70   Pulse: 71   Resp: 16   Temp: 98.3 F (36.8 C)   SpO2: 96%   Weight: 130 lb 3.2 oz (59.1 kg)   Height: 5\' 4"  (1.626 m)   PainSc: 0-No pain 0-No pain   Body mass index is 22.35 kg/m.  Advanced Directives 11/27/2020 06/21/2019 03/05/2018  Does Patient Have a Medical Advance Directive? Yes No No  Type of Paramedic of [place];Living will - -  Does patient want to make changes to medical advance directive? No - Patient declined - Yes (ED - Information included in AVS)  Copy of [place] in Chart? No - copy requested - -  Would patient like information on creating a medical advance directive? - Yes (ED - Information included in AVS) -    Current Medications (verified) Outpatient Encounter Medications as of 11/27/2020  Medication Sig   acetaminophen (TYLENOL) 500 MG tablet Take 500 mg by mouth every 6 (six) hours as needed.   alendronate (FOSAMAX) 70 MG tablet Take 70 mg by mouth once a week. Take with a full glass of water on an empty stomach.   clobetasol cream (TEMOVATE) 6.56 % Apply 1 application topically as needed.   EPINEPHrine 0.3 mg/0.3 mL IJ SOAJ injection Inject 0.3 mLs (0.3 mg total) into the muscle as needed.   estradiol (ESTRACE) 0.1 MG/GM vaginal cream Place 1 Applicatorful vaginally 2 (two) times a week.    famotidine (PEPCID) 20 MG tablet TAKE 1 TABLET BY MOUTH TWICE A DAY   LORazepam (ATIVAN) 0.5 MG tablet take 1 tablet by mouth every 8 hours if needed for anxiety   Menaquinone-7 (VITAMIN K2) 100  MCG CAPS Take 1 tablet by mouth daily.   metroNIDAZOLE (METROGEL) 0.75 % gel Apply 1 application topically at bedtime.   Multiple Minerals-Vitamins (CALCIUM CITRATE +) TABS Taking daily   Multiple Vitamin (MULTIVITAMIN) capsule Take 1 capsule by mouth daily.   sulfamethoxazole-trimethoprim (BACTRIM DS) 800-160 MG tablet Take 1 tablet by mouth 2 (two) times daily. (Patient not taking: Reported on 11/27/2020)   No facility-administered encounter medications on file as of 11/27/2020.    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   Endometriosis    Increased homocysteine    high-normal   Osteopenia    T score ; -2.1  @ L femoral neck in 2006   Plantar fasciitis    Past Surgical History:  Procedure Laterality Date   COLONOSCOPY  2013   Dr Sharlett Iles   HEMORRHOID SURGERY     LAPAROSCOPIC ENDOMETRIOSIS FULGURATION     5 surgeries; Dr Irven [place], [place]   POLYPECTOMY  2001    Dr.Sam Greeley    Family History  Problem Relation Age of Onset   Hyperlipidemia Brother    Heart failure Father        heart attack @ 31 after CVA   Stroke Father 50       hemorrhagic  after MI   Diabetes Father    Endometriosis Mother    Hyperlipidemia Mother    Osteopenia Mother    Heart attack Maternal Grandfather 44   Liver cancer Maternal Aunt    Colon cancer Paternal Grandmother    Alzheimer's disease Maternal Grandmother    Social History   Socioeconomic History   Marital status: Married    Spouse name: Not on file   Number of children: 0   Years of education: Not on file   Highest education level: Bachelor's degree (e.g., BA, AB, BS)  Occupational History   Occupation: Human resources officer  Tobacco Use   Smoking status: Never Smoker   Smokeless tobacco: Never Used  Scientific laboratory technician Use:  Never used  Substance and Sexual Activity   Alcohol use: Yes    Comment:   3/ week   Drug use: No   Sexual activity: Yes  Other Topics Concern   Not on file  Social History Narrative   Heart healthy diet   Regular Exercise-yes (gym 3 times a week)   Social Determinants of Health   Financial Resource Strain: Low Risk    Difficulty of Paying Living Expenses: Not hard at all  Food Insecurity: No Food Insecurity   Worried About Charity fundraiser in the Last Year: Never true   Ran Out of Food in the Last Year: Never true  Transportation Needs: No Transportation Needs   Lack of Transportation (Medical): No   Lack of Transportation (Non-Medical): No  Physical Activity: Sufficiently Active   Days of Exercise per Week: 5 days   Minutes of Exercise per Session: 30 min  Stress: Stress Concern Present   Feeling of Stress : Rather much  Social Connections: Socially Integrated   Frequency of Communication with Friends and Family: More than three times a week   Frequency of Social Gatherings with Friends and Family: More than three times a week   Attends Religious Services: More than 4 times per year   Active Member of [employer] or Organizations: Yes   Attends Music therapist: More than 4 times per year   Marital Status: Married    Tobacco Counseling Counseling given: Not Answered   Clinical Intake:  Pre-visit preparation completed: Yes  Pain : No/denies pain Pain Score: 0-No pain     BMI - recorded: 22.35 Nutritional Status: BMI of 19-24  Normal Nutritional Risks: None Diabetes: No  How often do you need to have someone help you when you read instructions, pamphlets, or other written materials from your doctor or pharmacy?: 1 - Never What is the last grade level you completed in school?: High School Graduate; 2 College Degrees  Diabetic? no  Interpreter Needed?: No  Information entered by :: Lisette Abu, LPN.   Activities of Daily  Living In your present state of health, do you have any difficulty performing the following activities: 11/27/2020  Hearing? N  Vision? N  Difficulty concentrating or making decisions? N  Walking or climbing stairs? N  Dressing or bathing? N  Doing errands, shopping? N  Preparing Food and eating ? N  Using the Toilet? N  In the past six months, have you accidently leaked urine? Y  Do you have problems with loss of bowel control? N  Managing your Medications? N  Managing your Finances? N  Housekeeping or managing your Housekeeping? N  Some recent data might be hidden    Patient Care Team: Binnie Rail, MD as  PCP - General (Internal Medicine) Charlton Haws, [facility] as Pharmacist (Pharmacist)  Indicate any recent Medical Services you may have received from other than [facility] providers in the past year (date may be approximate).     Assessment:   This is a routine wellness examination for Tu[place]anda.  Hearing/Vision screen No exam data present  Dietary issues and exercise activities discussed: Current Exercise Habits: Structured exercise class;Home exercise routine, Type of exercise: walking;strength training/weights;stretching;treadmill;Other - see comments (physical therapy exercises), Time (Minutes): 30, Frequency (Times/Week): 5, Weekly Exercise (Minutes/Week): 150, Intensity: Moderate, Exercise limited by: orthopedic condition(s)  Goals     Patient Stated     Increase my physical activity by doing more yoga and get back to church as soon as possible.     Pharmacy Care Plan     CARE PLAN ENTRY (see longitudinal plan of care for additional care plan information)  Current Barriers:   Chronic Disease Management support, education, and care coordination needs related to Hyperlipidemia and Osteoporosis    Hyperlipidemia Lab Results  Component Value Date/Time   LDLCALC 115 (H) 05/21/2018 09:23 AM   LDLCALC 123 (H) 07/20/2014 10:37 AM   LDLDIRECT 116.2 05/20/2012 10:29 AM    Pharmacist Clinical Goal(s): o Over the next 180 days, patient will work with PharmD and providers to achieve LDL goal < 100  Current regimen:  o No medications  Interventions: o Discussed cholesterol goals and benefits of diet/exercise for prevention of heart attack / stroke  Patient self care activities - Over the next 180 days, patient will: o Focus on diet and exercise routine to lower cholesterol  Osteoporosis  Pharmacist Clinical Goal(s) o Over the next 180 days, patient will work with PharmD and providers to optimize therapy  Current regimen:  o alendronate 70 mg weekly o Calcium-Vitamin D  Interventions: o Discussed optimal administration of alendronate on empty stomach with full glass of water, separated from other medications or food by at least 30 min  Patient self care activities - Over the next 180 days, patient will: o Begin weight-bearing exercises 3 days a week  Medication management  Pharmacist Clinical Goal(s): o Over the next 180 days, patient will work with PharmD and providers to maintain optimal medication adherence  Current pharmacy: [facility]  Interventions o Comprehensive medication review performed. o Continue current medication management strategy  Patient self care activities - Over the next 180 days, patient will: o Focus on medication adherence by fill date o Take medications as prescribed o Report any questions or concerns to PharmD and/or provider(s)  Please see past updates related to this goal by clicking on the "Past Updates" button in the selected goal       Depression Screen PHQ 2/9 Scores 11/27/2020 09/04/2020 06/21/2019 03/05/2018 07/28/2016 07/19/2013  PHQ - 2 Score 3 0 1 1 0 0  PHQ- 9 Score - - 4 - - -    Fall Risk Fall Risk  11/27/2020 09/04/2020 06/21/2019 04/29/2018 03/05/2018  Falls in the past year? 0 0 1 No No  Comment - - - Emmi Telephone Survey: data to providers prior to load -  Number falls in past yr: 0 0 1 - -  Comment - -  - - -  Injury with Fall? 0 0 1 - -  Comment - - - - -  Risk for fall due to : No Fall Risks No Fall Risks - - -  Follow up - Falls evaluation completed - - -    FALL RISK  PREVENTION PERTAINING TO THE HOME:  Any stairs in or around the home? Yes  If so, are there any without handrails? No  Home free of loose throw rugs in walkways, pet beds, electrical cords, etc? Yes  Adequate lighting in your home to reduce risk of falls? Yes   ASSISTIVE DEVICES UTILIZED TO PREVENT FALLS:  Life alert? Yes  Use of a cane, walker or w/c? No  Grab bars in the bathroom? No  Shower chair or bench in shower? Yes  Elevated toilet seat or a handicapped toilet? Yes   TIMED UP AND GO:  Was the test performed? No .  Length of time to ambulate 10 feet: 0 sec.   Gait steady and fast without use of assistive device  Cognitive Function: Normal cognitive status assessed by direct observation by this Nurse Health Advisor. No abnormalities found.          Immunizations Immunization History  Administered Date(s) Administered   Fluad Quad(high Dose 65+) 06/21/2019, 07/19/2020   Influenza Split 07/31/2011, 07/14/2012   Influenza Whole 07/18/2008, 10/10/2009, 06/18/2010   Influenza, High Dose Seasonal PF 07/28/2016, 07/17/2017, 06/24/2018   Influenza,inj,Quad PF,6+ Mos 07/20/2013, 07/20/2014, 07/24/2015   Moderna SARS-COV2 Booster Vaccination 10/06/2020   Moderna Sars-Covid-2 Vaccination 11/11/2019, 12/07/2019   Pneumococcal Conjugate-13 12/05/2016   Pneumococcal Polysaccharide-23 03/05/2018   Tdap 11/25/2006, 01/16/2017   Zoster 03/26/2016    TDAP status: Up to date  Flu Vaccine status: Up to date  Pneumococcal vaccine status: Up to date  Covid-19 vaccine status: Completed vaccines  Qualifies for Shingles Vaccine? Yes   Zostavax completed Yes   Shingrix Completed?: No.    Education has been provided regarding the importance of this vaccine. Patient has been advised to call  insurance company to determine out of pocket expense if they have not yet received this vaccine. Advised may also receive vaccine at local pharmacy or Health Dept. Verbalized acceptance and understanding.  Screening Tests Health Maintenance  Topic Date Due   MAMMOGRAM  04/19/2021   DEXA SCAN  10/16/2021   COLONOSCOPY (Pts 45-46yrs Insurance coverage will need to be confirmed)  03/24/2022   TETANUS/TDAP  01/17/2027   INFLUENZA VACCINE  Completed   COVID-19 Vaccine  Completed   Hepatitis C Screening  Completed   PNA vac Low Risk Adult  Completed   HPV VACCINES  Aged Out    Health Maintenance  There are no preventive care reminders to display for this patient.  Colorectal cancer screening: Type of screening: Colonoscopy. Completed 03/24/2012. Repeat every 10 years  Mammogram status: Completed 04/26/2020. Repeat every year  Bone Density status: Completed 10/17/2019. Results reflect: Bone density results: OSTEOPENIA. Repeat every 2 years.  Lung Cancer Screening: (Low Dose CT Chest recommended if Age 56-80 years, 30 pack-year currently smoking OR have quit w/in 15years.) does not qualify.   Lung Cancer Screening Referral: no  Additional Screening:  Hepatitis C Screening: does qualify; Completed yes  Vision Screening: Recommended annual ophthalmology exams for early detection of glaucoma and other disorders of the eye. Is the patient up to date with their annual eye exam?  Yes  Who is the provider or what is the name of the office in which the patient attends annual eye exams? Marica Otter, OD. If pt is not established with a provider, would they like to be referred to a provider to establish care? No .   Dental Screening: Recommended annual dental exams for proper oral hygiene  Community Resource Referral / Chronic Care Management: CRR  required this visit?  No   CCM required this visit?  No      Plan:     I have personally reviewed and noted the following in the  patients chart:    Medical and social history  Use of alcohol, tobacco or illicit drugs   Current medications and supplements  Functional ability and status  Nutritional status  Physical activity  Advanced directives  List of other physicians  Hospitalizations, surgeries, and ER visits in previous 12 months  Vitals  Screenings to include cognitive, depression, and falls  Referrals and appointments  In addition, I have reviewed and discussed with patient certain preventive protocols, quality metrics, and best practice recommendations. A written personalized care plan for preventive services as well as general preventive health recommendations were provided to patient.     Sheral Flow, LPN   01/04/5461   Nurse Notes:  Medications reviewed with patient; no opioid use noted.

## 2020-11-27 NOTE — Patient Instructions (Signed)
[name] , Thank you for taking time to come for your Medicare Wellness Visit. I appreciate your ongoing commitment to your health goals. Please review the following plan we discussed and let me know if I can assist you in the future.   Screening recommendations/referrals: Colonoscopy: 03/24/2012; due every 10 years (2023) Mammogram: 04/26/2020; due every year Bone Density: 10/17/2019; due every 2 years (2023) Recommended yearly ophthalmology/optometry visit for glaucoma screening and checkup Recommended yearly dental visit for hygiene and checkup  Vaccinations: Influenza vaccine: 07/19/2020 Pneumococcal vaccine: 12/05/2016, 03/05/2018 Tdap vaccine: 01/16/2017; due every 10 years (2028) Shingles vaccine: never done Covid-19: Moderna  11/11/2019, 12/07/2019, 10/06/2020  Advanced directives: Please bring a copy of your health care power of attorney and living will to the office at your convenience.  Conditions/risks identified: Yes; Reviewed health maintenance screenings with patient today and relevant education, vaccines, and/or referrals were provided. Please continue to do your personal lifestyle choices by: daily care of teeth and gums, regular physical activity (goal should be 5 days a week for 30 minutes), eat a healthy diet, avoid tobacco and drug use, limiting any alcohol intake, taking a low-dose aspirin (if not allergic or have been advised by your provider otherwise) and taking vitamins and minerals as recommended by your provider. Continue doing brain stimulating activities (puzzles, reading, adult coloring books, staying active) to keep memory sharp. Continue to eat heart healthy diet (full of fruits, vegetables, whole grains, lean protein, water--limit salt, fat, and sugar intake) and increase physical activity as tolerated.  Next appointment: Please schedule your next Medicare Wellness Visit with your Nurse Health Advisor in 1 year by calling [phone].  Preventive Care 70 Years and  Older, Female Preventive care refers to lifestyle choices and visits with your health care provider that can promote health and wellness. What does preventive care include?  A yearly physical exam. This is also called an annual well check.  Dental exams once or twice a year.  Routine eye exams. Ask your health care provider how often you should have your eyes checked.  Personal lifestyle choices, including:  Daily care of your teeth and gums.  Regular physical activity.  Eating a healthy diet.  Avoiding tobacco and drug use.  Limiting alcohol use.  Practicing safe sex.  Taking low-dose aspirin every day.  Taking vitamin and mineral supplements as recommended by your health care provider. What happens during an annual well check? The services and screenings done by your health care provider during your annual well check will depend on your age, overall health, lifestyle risk factors, and family history of disease. Counseling  Your health care provider may ask you questions about your:  Alcohol use.  Tobacco use.  Drug use.  Emotional well-being.  Home and relationship well-being.  Sexual activity.  Eating habits.  History of falls.  Memory and ability to understand (cognition).  Work and work Statistician.  Reproductive health. Screening  You may have the following tests or measurements:  Height, weight, and BMI.  Blood pressure.  Lipid and cholesterol levels. These may be checked every 5 years, or more frequently if you are over 25 years old.  Skin check.  Lung cancer screening. You may have this screening every year starting at age 60 if you have a 30-pack-year history of smoking and currently smoke or have quit within the past 15 years.  Fecal occult blood test (FOBT) of the stool. You may have this test every year starting at age 21.  Flexible sigmoidoscopy or  colonoscopy. You may have a sigmoidoscopy every 5 years or a colonoscopy every 10 years  starting at age 78.  Hepatitis C blood test.  Hepatitis B blood test.  Sexually transmitted disease (STD) testing.  Diabetes screening. This is done by checking your blood sugar (glucose) after you have not eaten for a while (fasting). You may have this done every 1-3 years.  Bone density scan. This is done to screen for osteoporosis. You may have this done starting at age 85.  Mammogram. This may be done every 1-2 years. Talk to your health care provider about how often you should have regular mammograms. Talk with your health care provider about your test results, treatment options, and if necessary, the need for more tests. Vaccines  Your health care provider may recommend certain vaccines, such as:  Influenza vaccine. This is recommended every year.  Tetanus, diphtheria, and acellular pertussis (Tdap, Td) vaccine. You may need a Td booster every 10 years.  Zoster vaccine. You may need this after age 54.  Pneumococcal 13-valent conjugate (PCV13) vaccine. One dose is recommended after age 84.  Pneumococcal polysaccharide (PPSV23) vaccine. One dose is recommended after age 85. Talk to your health care provider about which screenings and vaccines you need and how often you need them. This information is not intended to replace advice given to you by your health care provider. Make sure you discuss any questions you have with your health care provider. Document Released: 10/12/2015 Document Revised: 06/04/2016 Document Reviewed: 07/17/2015 Elsevier Interactive Patient Education  2017 [place] Prevention in the Home Falls can cause injuries. They can happen to people of all ages. There are many things you can do to make your home safe and to help prevent falls. What can I do on the outside of my home?  Regularly fix the edges of walkways and driveways and fix any cracks.  Remove anything that might make you trip as you walk through a door, such as a raised step or  threshold.  Trim any bushes or trees on the path to your home.  Use bright outdoor lighting.  Clear any walking paths of anything that might make someone trip, such as rocks or tools.  Regularly check to see if handrails are loose or broken. Make sure that both sides of any steps have handrails.  Any raised decks and porches should have guardrails on the edges.  Have any leaves, snow, or ice cleared regularly.  Use sand or salt on walking paths during winter.  Clean up any spills in your garage right away. This includes oil or grease spills. What can I do in the bathroom?  Use night lights.  Install grab bars by the toilet and in the tub and shower. Do not use towel bars as grab bars.  Use non-skid mats or decals in the tub or shower.  If you need to sit down in the shower, use a plastic, non-slip stool.  Keep the floor dry. Clean up any water that spills on the floor as soon as it happens.  Remove soap buildup in the tub or shower regularly.  Attach bath mats securely with double-sided non-slip rug tape.  Do not have throw rugs and other things on the floor that can make you trip. What can I do in the bedroom?  Use night lights.  Make sure that you have a light by your bed that is easy to reach.  Do not use any sheets or blankets that are too  big for your bed. They should not hang down onto the floor.  Have a firm chair that has side arms. You can use this for support while you get dressed.  Do not have throw rugs and other things on the floor that can make you trip. What can I do in the kitchen?  Clean up any spills right away.  Avoid walking on wet floors.  Keep items that you use a lot in easy-to-reach places.  If you need to reach something above you, use a strong step stool that has a grab bar.  Keep electrical cords out of the way.  Do not use floor polish or wax that makes floors slippery. If you must use wax, use non-skid floor wax.  Do not have  throw rugs and other things on the floor that can make you trip. What can I do with my stairs?  Do not leave any items on the stairs.  Make sure that there are handrails on both sides of the stairs and use them. Fix handrails that are broken or loose. Make sure that handrails are as long as the stairways.  Check any carpeting to make sure that it is firmly attached to the stairs. Fix any carpet that is loose or worn.  Avoid having throw rugs at the top or bottom of the stairs. If you do have throw rugs, attach them to the floor with carpet tape.  Make sure that you have a light switch at the top of the stairs and the bottom of the stairs. If you do not have them, ask someone to add them for you. What else can I do to help prevent falls?  Wear shoes that:  Do not have high heels.  Have rubber bottoms.  Are comfortable and fit you well.  Are closed at the toe. Do not wear sandals.  If you use a stepladder:  Make sure that it is fully opened. Do not climb a closed stepladder.  Make sure that both sides of the stepladder are locked into place.  Ask someone to hold it for you, if possible.  Clearly mark and make sure that you can see:  Any grab bars or handrails.  First and last steps.  Where the edge of each step is.  Use tools that help you move around (mobility aids) if they are needed. These include:  Canes.  Walkers.  Scooters.  Crutches.  Turn on the lights when you go into a dark area. Replace any light bulbs as soon as they burn out.  Set up your furniture so you have a clear path. Avoid moving your furniture around.  If any of your floors are uneven, fix them.  If there are any pets around you, be aware of where they are.  Review your medicines with your doctor. Some medicines can make you feel dizzy. This can increase your chance of falling. Ask your doctor what other things that you can do to help prevent falls. This information is not intended to  replace advice given to you by your health care provider. Make sure you discuss any questions you have with your health care provider. Document Released: 07/12/2009 Document Revised: 02/21/2016 Document Reviewed: 10/20/2014 Elsevier Interactive Patient Education  2017 [employer].

## 2020-12-12 DIAGNOSIS — R269 Unspecified abnormalities of gait and mobility: Secondary | ICD-10-CM | POA: Diagnosis not present

## 2020-12-17 NOTE — Patient Instructions (Addendum)
  Blood work was ordered.     Medications changes include :   none  Your prescription(s) have been submitted to your pharmacy. Please take as directed and contact our office if you believe you are having problem(s) with the medication(s).   A Ct scan was ordered for your heart.    Please followup in 1 year, sooner if needed

## 2020-12-17 NOTE — Progress Notes (Signed)
Subjective:    Patient ID: Brittany Myers, female    DOB: 1951-04-13, 70 y.o.   MRN: [id number]  HPI The patient is here for follow up of their chronic medical problems, including GERD, anxiety, OP  She is taking all of her medications as prescribed.   She is always cold.      Some difficulty with memory, recall.     Eating pretty well.   Dong PT.  She is in the gym.       Medications and allergies reviewed with patient and updated if appropriate.  Patient Active Problem List   Diagnosis Date Noted  . Paronychia of left ring finger 09/04/2020  . Grieving 05/04/2020  . Foot fracture, left 09/15/2019  . Left foot pain 08/19/2019  . Nondisplaced fracture of fifth metatarsal bone, left foot, subsequent encounter for fracture with routine healing 08/09/2019  . Acute pain of left knee 03/11/2019  . Acute pain of right shoulder 03/11/2019  . Cough 10/13/2018  . Rib pain on right side 06/24/2018  . GERD (gastroesophageal reflux disease) 07/28/2016  . Anxiety 07/28/2016  . Nonspecific abnormal electrocardiogram (ECG) (EKG) 03/11/2016  . Shingles rash 02/13/2016  . 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 Family history of ischemic heart disease 05/20/2011  . Osteoporosis 05/20/2011  . 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 TINNITUS, RIGHT 12/20/2009  . COLONIC POLYPS, BENIGN 02/11/2008    Current Outpatient Medications on File Prior to Visit  Medication Sig Dispense Refill  . acetaminophen (TYLENOL) 500 MG tablet Take 500 mg by mouth every 6 (six) hours as needed.    Marland Kitchen alendronate (FOSAMAX) 70 MG tablet Take 70 mg by mouth once a week. Take with a full glass of water on an empty stomach.    . clobetasol cream (TEMOVATE) 3.53 % Apply 1 application topically as needed.    Marland Kitchen EPINEPHrine 0.3 mg/0.3 mL IJ SOAJ injection Inject 0.3 mLs (0.3 mg total) into the muscle as needed. 2 each 0  . estradiol  (ESTRACE) 0.1 MG/GM vaginal cream Place 1 Applicatorful vaginally 2 (two) times a week.     Marland Kitchen LORazepam (ATIVAN) 0.5 MG tablet take 1 tablet by mouth every 8 hours if needed for anxiety 30 tablet 2  . Menaquinone-7 (VITAMIN K2) 100 MCG CAPS Take 1 tablet by mouth daily.    . metroNIDAZOLE (METROGEL) 0.75 % gel Apply 1 application topically at bedtime.    . Multiple Minerals-Vitamins (CALCIUM CITRATE +) TABS Taking daily  0  . Multiple Vitamin (MULTIVITAMIN) capsule Take 1 capsule by mouth daily.     No current facility-administered medications on file prior to visit.    Past Medical History:  Diagnosis Date  . 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 Endometriosis   . Increased homocysteine    high-normal  . Osteopenia    T score ; -2.1  @ L femoral neck in 2006  . Plantar fasciitis     Past Surgical History:  Procedure Laterality Date  . COLONOSCOPY  2013   Dr Sharlett Iles  . HEMORRHOID SURGERY    . LAPAROSCOPIC ENDOMETRIOSIS FULGURATION     5 surgeries; Dr Irven [place], [place]  . POLYPECTOMY  2001    Dr.Sam Holland     Social History   Socioeconomic History  . Marital status: Married    Spouse name: Not on file  . Number of children: 0  . Years of education:  Not on file  . Highest education level: Bachelor's degree (e.g., BA, AB, BS)  Occupational History  . Occupation: Human resources officer  Tobacco Use  . Smoking status: Never Smoker  . Smokeless tobacco: Never Used  Vaping Use  . Vaping Use: Never used  Substance and Sexual Activity  . Alcohol use: Yes    Comment:   3/ week  . Drug use: No  . Sexual activity: Yes  Other Topics Concern  . Not on file  Social History Narrative   Heart healthy diet   Regular Exercise-yes (gym 3 times a week)   Social Determinants of Health   Financial Resource Strain: Low Risk   . Difficulty of Paying Living Expenses: Not hard at all  Food Insecurity: No Food Insecurity  . Worried About Charity fundraiser in the Last Year:  Never true  . Ran Out of Food in the Last Year: Never true  Transportation Needs: No Transportation Needs  . Lack of Transportation (Medical): No  . Lack of Transportation (Non-Medical): No  Physical Activity: Sufficiently Active  . Days of Exercise per Week: 5 days  . Minutes of Exercise per Session: 30 min  Stress: Stress Concern Present  . Feeling of Stress : Rather much  Social Connections: Socially Integrated  . Frequency of Communication with Friends and Family: More than three times a week  . Frequency of Social Gatherings with Friends and Family: More than three times a week  . Attends Religious Services: More than 4 times per year  . Active Member of Clubs or Organizations: Yes  . Attends Archivist Meetings: More than 4 times per year  . Marital Status: Married    Family History  Problem Relation Age of Onset  . Hyperlipidemia Brother   . Heart failure Father        heart attack @ 40 after CVA  . Stroke Father 7       hemorrhagic  after MI  . Diabetes Father   . Endometriosis Mother   . Hyperlipidemia Mother   . Osteopenia Mother   . Heart attack Maternal Grandfather 65  . Liver cancer Maternal Aunt   . Colon cancer Paternal Grandmother   . Alzheimer's disease Maternal Grandmother     Review of Systems  Constitutional: Negative for appetite change and fever.  Respiratory: Negative for cough, shortness of breath and wheezing.   Cardiovascular: Positive for chest pain (related to gerd). Negative for palpitations and leg swelling.  Gastrointestinal: Positive for abdominal distention (occ, related to certain foods). Negative for abdominal pain, blood in stool, constipation and diarrhea.       Occ gerd  Neurological: Negative for dizziness, light-headedness and headaches.  Psychiatric/Behavioral: Positive for dysphoric mood and sleep disturbance (occ). The patient is nervous/anxious.        Objective:   Vitals:   12/18/20 1101  BP: 112/78  Pulse: 83   Temp: 98.1 F (36.7 C)  SpO2: 97%   BP Readings from Last 3 Encounters:  12/18/20 112/78  11/27/20 110/70  09/04/20 112/72   Wt Readings from Last 3 Encounters:  12/18/20 128 lb (58.1 kg)  11/27/20 130 lb 3.2 oz (59.1 kg)  09/04/20 131 lb 3.2 oz (59.5 kg)   Body mass index is 21.97 kg/m.   Physical Exam    Constitutional: Appears well-developed and well-nourished. No distress.  HENT:  Head: Normocephalic and atraumatic.  Neck: Neck supple. No tracheal deviation present. No thyromegaly present.  No cervical lymphadenopathy  Cardiovascular: Normal rate, regular rhythm and normal heart sounds.   No murmur heard. No carotid bruit .  No edema Pulmonary/Chest: Effort normal and breath sounds normal. No respiratory distress. No has no wheezes. No rales.  Abdomen: soft, NT, ND Skin: Skin is warm and dry. Not diaphoretic.  Psychiatric: Normal mood and affect. Behavior is normal.      Assessment & Plan:    See Problem List for Assessment and Plan of chronic medical problems.    This visit occurred during the SARS-CoV-2 public health emergency.  Safety protocols were in place, including screening questions prior to the visit, additional usage of staff PPE, and extensive cleaning of exam room while observing appropriate contact time as indicated for disinfecting solutions.

## 2020-12-18 ENCOUNTER — Ambulatory Visit (INDEPENDENT_AMBULATORY_CARE_PROVIDER_SITE_OTHER): Payer: Medicare Other | Admitting: Internal Medicine

## 2020-12-18 ENCOUNTER — Other Ambulatory Visit: Payer: Self-pay

## 2020-12-18 ENCOUNTER — Encounter: Payer: Self-pay | Admitting: Internal Medicine

## 2020-12-18 VITALS — BP 112/78 | HR 83 | Temp 98.1°F | Ht 64.0 in | Wt 128.0 lb

## 2020-12-18 DIAGNOSIS — Z8249 Family history of ischemic heart disease and other diseases of the circulatory system: Secondary | ICD-10-CM

## 2020-12-18 DIAGNOSIS — Z136 Encounter for screening for cardiovascular disorders: Secondary | ICD-10-CM | POA: Diagnosis not present

## 2020-12-18 DIAGNOSIS — K219 Gastro-esophageal reflux disease without esophagitis: Secondary | ICD-10-CM

## 2020-12-18 DIAGNOSIS — G479 Sleep disorder, unspecified: Secondary | ICD-10-CM

## 2020-12-18 DIAGNOSIS — R9431 Abnormal electrocardiogram [ECG] [EKG]: Secondary | ICD-10-CM

## 2020-12-18 DIAGNOSIS — F419 Anxiety disorder, unspecified: Secondary | ICD-10-CM | POA: Diagnosis not present

## 2020-12-18 DIAGNOSIS — M810 Age-related osteoporosis without current pathological fracture: Secondary | ICD-10-CM | POA: Diagnosis not present

## 2020-12-18 DIAGNOSIS — R413 Other amnesia: Secondary | ICD-10-CM | POA: Diagnosis not present

## 2020-12-18 MED ORDER — LORAZEPAM 0.5 MG PO TABS: ORAL_TABLET | ORAL | 2 refills | Status: DC

## 2020-12-18 MED ORDER — ALENDRONATE SODIUM 70 MG PO TABS: 70.0000 mg | ORAL_TABLET | ORAL | 3 refills | Status: DC

## 2020-12-18 NOTE — Assessment & Plan Note (Signed)
Chronic Controlled, stable - mild overall Continue ativan 0.5 mg daily prn

## 2020-12-18 NOTE — Assessment & Plan Note (Signed)
Check lipids Check Ct coronary artery score

## 2020-12-18 NOTE — Assessment & Plan Note (Signed)
Chronic Intermittent Continue ativan 0.5 mg nightly prn only

## 2020-12-18 NOTE — Assessment & Plan Note (Signed)
Chronic On fosamax 70 mg weekly - continue dexa up to date Exercising regularly Taking calcium and vitamin d daily Ck vit d level

## 2020-12-19 ENCOUNTER — Other Ambulatory Visit (INDEPENDENT_AMBULATORY_CARE_PROVIDER_SITE_OTHER): Payer: Medicare Other

## 2020-12-19 DIAGNOSIS — R413 Other amnesia: Secondary | ICD-10-CM | POA: Diagnosis not present

## 2020-12-19 DIAGNOSIS — M810 Age-related osteoporosis without current pathological fracture: Secondary | ICD-10-CM

## 2020-12-19 DIAGNOSIS — Z8249 Family history of ischemic heart disease and other diseases of the circulatory system: Secondary | ICD-10-CM | POA: Diagnosis not present

## 2020-12-19 LAB — LIPID PANEL
Cholesterol: 187 mg/dL (ref 0–200)
HDL: 64.4 mg/dL (ref >39.00)
LDL Cholesterol: 108 mg/dL — ABNORMAL HIGH (ref 0–99)
NonHDL: 122.3
Total CHOL/HDL Ratio: 3
Triglycerides: 74 mg/dL (ref 0.0–149.0)
VLDL: 14.8 mg/dL (ref 0.0–40.0)

## 2020-12-19 LAB — COMPREHENSIVE METABOLIC PANEL
ALT: 14 U/L (ref 0–35)
AST: 19 U/L (ref 0–37)
Albumin: 4.3 g/dL (ref 3.5–5.2)
Alkaline Phosphatase: 45 U/L (ref 39–117)
BUN: 17 mg/dL (ref 6–23)
CO2: 28 mEq/L (ref 19–32)
Calcium: 8.9 mg/dL (ref 8.4–10.5)
Chloride: 107 mEq/L (ref 96–112)
Creatinine, Ser: 0.79 mg/dL (ref 0.40–1.20)
GFR: 76.16 mL/min (ref >60.00)
Glucose, Bld: 97 mg/dL (ref 70–99)
Potassium: 4.2 mEq/L (ref 3.5–5.1)
Sodium: 142 mEq/L (ref 135–145)
Total Bilirubin: 0.7 mg/dL (ref 0.2–1.2)
Total Protein: 6.5 g/dL (ref 6.0–8.3)

## 2020-12-19 LAB — CBC WITH DIFFERENTIAL/PLATELET
Basophils Absolute: 0 10*3/uL (ref 0.0–0.1)
Basophils Relative: 0.8 % (ref 0.0–3.0)
Eosinophils Absolute: 0.2 10*3/uL (ref 0.0–0.7)
Eosinophils Relative: 2.9 % (ref 0.0–5.0)
HCT: 42.7 % (ref 36.0–46.0)
Hemoglobin: 14.9 g/dL (ref 12.0–15.0)
Lymphocytes Relative: 34.2 % (ref 12.0–46.0)
Lymphs Abs: 1.9 10*3/uL (ref 0.7–4.0)
MCHC: 34.8 g/dL (ref 30.0–36.0)
MCV: 91.6 fl (ref 78.0–100.0)
Monocytes Absolute: 0.5 10*3/uL (ref 0.1–1.0)
Monocytes Relative: 8.8 % (ref 3.0–12.0)
Neutro Abs: 2.9 10*3/uL (ref 1.4–7.7)
Neutrophils Relative %: 53.3 % (ref 43.0–77.0)
Platelets: 214 10*3/uL (ref 150.0–400.0)
RBC: 4.66 Mil/uL (ref 3.87–5.11)
RDW: 12.5 % (ref 11.5–15.5)
WBC: 5.5 10*3/uL (ref 4.0–10.5)

## 2020-12-19 LAB — VITAMIN B12: Vitamin B-12: 686 pg/mL (ref 211–911)

## 2020-12-19 LAB — VITAMIN D 25 HYDROXY (VIT D DEFICIENCY, FRACTURES): VITD: 58.63 ng/mL (ref 30.00–100.00)

## 2020-12-19 LAB — TSH: TSH: 2.49 u[IU]/mL (ref 0.35–4.50)

## 2020-12-25 DIAGNOSIS — R269 Unspecified abnormalities of gait and mobility: Secondary | ICD-10-CM | POA: Diagnosis not present

## 2021-01-01 ENCOUNTER — Other Ambulatory Visit: Payer: Self-pay

## 2021-01-31 ENCOUNTER — Other Ambulatory Visit: Payer: Self-pay

## 2021-01-31 ENCOUNTER — Ambulatory Visit (INDEPENDENT_AMBULATORY_CARE_PROVIDER_SITE_OTHER)
Admission: RE | Admit: 2021-01-31 | Discharge: 2021-01-31 | Disposition: A | Discharge status: Home / Self Care | Payer: Self-pay | Source: Ambulatory Visit | Attending: Internal Medicine | Admitting: Internal Medicine

## 2021-01-31 DIAGNOSIS — Z136 Encounter for screening for cardiovascular disorders: Secondary | ICD-10-CM

## 2021-01-31 DIAGNOSIS — Z8249 Family history of ischemic heart disease and other diseases of the circulatory system: Secondary | ICD-10-CM

## 2021-01-31 DIAGNOSIS — R9431 Abnormal electrocardiogram [ECG] [EKG]: Secondary | ICD-10-CM

## 2021-01-31 HISTORY — PX: OTHER SURGICAL HISTORY: SHX169

## 2021-02-13 DIAGNOSIS — L820 Inflamed seborrheic keratosis: Secondary | ICD-10-CM | POA: Diagnosis not present

## 2021-02-13 DIAGNOSIS — L821 Other seborrheic keratosis: Secondary | ICD-10-CM | POA: Diagnosis not present

## 2021-02-13 DIAGNOSIS — L814 Other melanin hyperpigmentation: Secondary | ICD-10-CM | POA: Diagnosis not present

## 2021-02-13 DIAGNOSIS — Z85828 Personal history of other malignant neoplasm of skin: Secondary | ICD-10-CM | POA: Diagnosis not present

## 2021-02-13 DIAGNOSIS — L570 Actinic keratosis: Secondary | ICD-10-CM | POA: Diagnosis not present

## 2021-02-13 DIAGNOSIS — D1801 Hemangioma of skin and subcutaneous tissue: Secondary | ICD-10-CM | POA: Diagnosis not present

## 2021-02-13 DIAGNOSIS — D225 Melanocytic nevi of trunk: Secondary | ICD-10-CM | POA: Diagnosis not present

## 2021-02-14 ENCOUNTER — Telehealth: Payer: Self-pay | Admitting: Internal Medicine

## 2021-02-14 NOTE — Progress Notes (Signed)
  Chronic Care Management   Note  02/14/2021 Name: Brittany Myers MRN: [id number] DOB: 02-22-1951  Brittany Myers is a 70 y.o. year old female who is a primary care patient of Burns, Claudina Lick, MD. I reached out to [employer] by phone today in response to a referral sent by Brittany Myers's PCP, Binnie Rail, MD.   [name] was given information about Chronic Care Management services today including:  1. CCM service includes personalized support from designated clinical staff supervised by her physician, including individualized plan of care and coordination with other care providers 2. 24/7 contact phone numbers for assistance for urgent and routine care needs. 3. Service will only be billed when office clinical staff spend 20 minutes or more in a month to coordinate care. 4. Only one practitioner may furnish and bill the service in a calendar month. 5. The patient may stop CCM services at any time (effective at the end of the month) by phone call to the office staff.   Patient agreed to services and verbal consent obtained.   Follow up plan:  [place]

## 2021-02-26 NOTE — Progress Notes (Deleted)
Chronic Care Management Pharmacy Note  02/26/2021 Name:  Brittany Myers MRN:  [id number] DOB:  1951-01-13  Subjective: Brittany Myers is an 70 y.o. year old female who is a primary patient of Burns, Claudina Lick, MD.  The CCM team was consulted for assistance with disease management and care coordination needs.    {CCMTELEPHONEFACETOFACE:21091510} for {CCMINITIALFOLLOWUPCHOICE:21091511} in response to provider referral for pharmacy case management and/or care coordination services.   Consent to Services:  {CCMCONSENTOPTIONS:25074}  Patient Care Team: Binnie Rail, MD as PCP - General (Internal Medicine) Charlton Haws, [facility] as Pharmacist (Pharmacist) Marica Otter, OD as Consulting Physician (Optometry) Eliberto Sole, Darnelle Maffucci, [facility] as Pharmacist (Pharmacist)  Recent office visits: ***  Recent consult visits: [facility] visits: {Hospital DC Yes/No:25215}  Objective:  Lab Results  Component Value Date   CREATININE 0.79 12/19/2020   BUN 17 12/19/2020   GFR 76.16 12/19/2020   GFRNONAA 104.67 05/15/2010   GFRAA 111 02/11/2008   NA 142 12/19/2020   K 4.2 12/19/2020   CALCIUM 8.9 12/19/2020   CO2 28 12/19/2020   GLUCOSE 97 12/19/2020    Lab Results  Component Value Date/Time   HGBA1C 5.5 05/21/2018 09:23 AM   HGBA1C 5.3 07/28/2016 10:12 AM   GFR 76.16 12/19/2020 09:48 AM   GFR 83.16 06/28/2019 09:31 AM    Last diabetic Eye exam:  No results found for: HMDIABEYEEXA  Last diabetic Foot exam:  No results found for: HMDIABFOOTEX   Lab Results  Component Value Date   CHOL 187 12/19/2020   HDL 64.40 12/19/2020   LDLCALC 108 (H) 12/19/2020   LDLDIRECT 116.2 05/20/2012   TRIG 74.0 12/19/2020   CHOLHDL 3 12/19/2020    Hepatic Function Latest Ref Rng & Units 12/19/2020 06/28/2019 05/21/2018  Total Protein 6.0 - 8.3 g/dL 6.5 6.9 7.0  Albumin 3.5 - 5.2 g/dL 4.3 4.4 4.4  AST 0 - 37 U/L 19 18 20   ALT 0 - 35 U/L 14 14 20   Alk Phosphatase 39 - 117 U/L 45 59 65  Total  Bilirubin 0.2 - 1.2 mg/dL 0.7 0.8 0.9  Bilirubin, Direct 0.0 - 0.3 mg/dL - - -    Lab Results  Component Value Date/Time   TSH 2.49 12/19/2020 09:48 AM   TSH 1.61 06/28/2019 09:31 AM   FREET4 0.67 03/07/2011 02:36 PM   FREET4 0.79 05/15/2010 12:00 AM    CBC Latest Ref Rng & Units 12/19/2020 06/28/2019 05/21/2018  WBC 4.0 - 10.5 K/uL 5.5 6.1 5.5  Hemoglobin 12.0 - 15.0 g/dL 14.9 15.4(H) 15.5(H)  Hematocrit 36.0 - 46.0 % 42.7 44.9 44.7  Platelets 150.0 - 400.0 K/uL 214.0 214.0 221.0    Lab Results  Component Value Date/Time   VD25OH 58.63 12/19/2020 09:48 AM   VD25OH 47.80 07/20/2014 10:37 AM    Clinical ASCVD: {YES/NO:21197} The 10-year ASCVD risk score Mikey Bussing DC Jr., et al., 2013) is: 6.2%   Values used to calculate the score:     Age: 89 years     Sex: Female     Is Non-Hispanic African American: No     Diabetic: No     Tobacco smoker: No     Systolic Blood Pressure: 354 mmHg     Is BP treated: No     HDL Cholesterol: 64.4 mg/dL     Total Cholesterol: 187 mg/dL    Depression screen [facility] 2/9 11/27/2020 09/04/2020 06/21/2019  Decreased Interest 0 0 0  Down, Depressed, Hopeless 3 0 1  PHQ - 2 Score 3 0 1  Altered sleeping - - 2  Tired, decreased energy - - 1  Change in appetite - - 0  Feeling bad or failure about yourself  - - 0  Trouble concentrating - - 0  Moving slowly or fidgety/restless - - 0  Suicidal thoughts - - 0  PHQ-9 Score - - 4  Difficult doing work/chores - - Not difficult at all     ***Other: (CHADS2VASc if Afib, MMRC or CAT for COPD, ACT, DEXA)  Social History   Tobacco Use  Smoking Status Never Smoker  Smokeless Tobacco Never Used   BP Readings from Last 3 Encounters:  12/18/20 112/78  11/27/20 110/70  09/04/20 112/72   Pulse Readings from Last 3 Encounters:  12/18/20 83  11/27/20 71  09/04/20 78   Wt Readings from Last 3 Encounters:  12/18/20 128 lb (58.1 kg)  11/27/20 130 lb 3.2 oz (59.1 kg)  09/04/20 131 lb 3.2 oz (59.5 kg)   BMI  Readings from Last 3 Encounters:  12/18/20 21.97 kg/m  11/27/20 22.35 kg/m  09/04/20 22.52 kg/m    Assessment/Interventions: Review of patient past medical history, allergies, medications, health status, including review of consultants reports, laboratory and other test data, was performed as part of comprehensive evaluation and provision of chronic care management services.   SDOH:  (Social Determinants of Health) assessments and interventions performed: {yes/no:20286}  SDOH Screenings   Alcohol Screen: Low Risk   . Last Alcohol Screening Score (AUDIT): 3  Depression (PHQ2-9): Low Risk   . PHQ-2 Score: 3  Financial Resource Strain: Low Risk   . Difficulty of Paying Living Expenses: Not hard at all  Food Insecurity: No Food Insecurity  . Worried About Charity fundraiser in the Last Year: Never true  . Ran Out of Food in the Last Year: Never true  Housing: Low Risk   . Last Housing Risk Score: 0  Physical Activity: Sufficiently Active  . Days of Exercise per Week: 5 days  . Minutes of Exercise per Session: 30 min  Social Connections: Socially Integrated  . Frequency of Communication with Friends and Family: More than three times a week  . Frequency of Social Gatherings with Friends and Family: More than three times a week  . Attends Religious Services: More than 4 times per year  . Active Member of Clubs or Organizations: Yes  . Attends Archivist Meetings: More than 4 times per year  . Marital Status: Married  Stress: Stress Concern Present  . Feeling of Stress : Rather much  Tobacco Use: Low Risk   . Smoking Tobacco Use: Never Smoker  . Smokeless Tobacco Use: Never Used  Transportation Needs: No Transportation Needs  . Lack of Transportation (Medical): No  . Lack of Transportation (Non-Medical): No    CCM Care Plan  Allergies  Allergen Reactions  . Ciprofloxacin     REACTION: red ,itchy feet, "furry throat" Because of a history of documented adverse  serious drug reaction;Medi Alert bracelet  is recommended  . Penicillins     REACTION: angioedema Because of a history of documented adverse serious drug reaction;Medi Alert bracelet  is recommended  . Terbinafine Hcl     Angioedema & shock 1999 from oral Lamisil  Because of a history of documented adverse serious drug reaction;Medi Alert bracelet  is recommended  . Erythromycin     REACTION: headaches  . Minocycline Hcl     REACTION: photosensitivity post sun exposure  .  Zolpidem Tartrate     REACTION: "weepy"    Medications Reviewed Today    Reviewed by Binnie Rail, MD (Physician) on 12/18/20 at 1115  Med List Status: <None>  Medication Order Taking? Sig Documenting Provider Last Dose Status Informant  acetaminophen (TYLENOL) 500 MG tablet [id number] Yes Take 500 mg by mouth every 6 (six) hours as needed. [provider] Taking Active Self  alendronate (FOSAMAX) 70 MG tablet [id number] Yes Take 70 mg by mouth once a week. Take with a full glass of water on an empty stomach. Aloha Gell, MD Taking Active Self  clobetasol cream (TEMOVATE) 0.05 % [id number] Yes Apply 1 application topically as needed. [provider] Taking Active   EPINEPHrine 0.3 mg/0.3 mL IJ SOAJ injection [id number] Yes Inject 0.3 mLs (0.3 mg total) into the muscle as needed. Binnie Rail, MD Taking Active   estradiol (ESTRACE) 0.1 MG/GM vaginal cream [id number] Yes Place 1 Applicatorful vaginally 2 (two) times a week.  [provider] Taking Active   LORazepam (ATIVAN) 0.5 MG tablet [id number] Yes take 1 tablet by mouth every 8 hours if needed for anxiety Burns, Claudina Lick, MD Taking Active   Menaquinone-7 (VITAMIN K2) 100 MCG CAPS [id number] Yes Take 1 tablet by mouth daily. [provider] Taking Active Self  metroNIDAZOLE (METROGEL) 0.75 % gel [id number] Yes Apply 1 application topically at bedtime. [provider] Taking Active   Multiple Minerals-Vitamins (CALCIUM CITRATE  +) TABS [id number] Yes Taking daily Binnie Rail, MD Taking Active   Multiple Vitamin (MULTIVITAMIN) capsule [id number] Yes Take 1 capsule by mouth daily. Binnie Rail, MD Taking Active           Patient Active Problem List   Diagnosis Date Noted  . Grieving 05/04/2020  . Foot fracture, left 09/15/2019  . Left foot pain 08/19/2019  . Nondisplaced fracture of fifth metatarsal bone, left foot, subsequent encounter for fracture with routine healing 08/09/2019  . Acute pain of left knee 03/11/2019  . Acute pain of right shoulder 03/11/2019  . Cough 10/13/2018  . Anxiety 07/28/2016  . Nonspecific abnormal electrocardiogram (ECG) (EKG) 03/11/2016  . Shingles rash 02/13/2016  . 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 Family history of ischemic heart disease 05/20/2011  . Osteoporosis 05/20/2011  . 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 TINNITUS, RIGHT 12/20/2009  . COLONIC POLYPS, BENIGN 02/11/2008    Immunization History  Administered Date(s) Administered  . Fluad Quad(high Dose 65+) 06/21/2019, 07/19/2020  . Influenza Split 07/31/2011, 07/14/2012  . Influenza Whole 07/18/2008, 10/10/2009, 06/18/2010  . Influenza, High Dose Seasonal PF 07/28/2016, 07/17/2017, 06/24/2018  . Influenza,inj,Quad PF,6+ Mos 07/20/2013, 07/20/2014, 07/24/2015  . Moderna SARS-COV2 Booster Vaccination 10/06/2020  . Moderna Sars-Covid-2 Vaccination 11/11/2019, 12/07/2019  . Pneumococcal Conjugate-13 12/05/2016  . Pneumococcal Polysaccharide-23 03/05/2018  . Tdap 11/25/2006, 01/16/2017  . Zoster, Live 03/26/2016    Conditions to be addressed/monitored:  {USCCMDZASSESSMENTOPTIONS:23563}  There are no care plans that you recently modified to display for this patient.     Medication Assistance: {MEDASSISTANCEINFO:25044}  Patient's preferred pharmacy is:  [id number] [place] [address]Phone: [phone]: [phone] [place] [address]Phone: [phone]: [phone]  Uses pill box? {Yes or If no, why not?:20788} Pt endorses ***%  compliance  Care Plan and Follow Up Patient Decision:  {FOLLOWUP:24991}  Plan: {CM FOLLOW UP PLAN:25073}  ***

## 2021-02-27 ENCOUNTER — Telehealth: Payer: Medicare Other

## 2021-03-04 ENCOUNTER — Telehealth: Payer: Self-pay | Admitting: Pharmacist

## 2021-03-25 DIAGNOSIS — D485 Neoplasm of uncertain behavior of skin: Secondary | ICD-10-CM | POA: Diagnosis not present

## 2021-03-25 DIAGNOSIS — Z85828 Personal history of other malignant neoplasm of skin: Secondary | ICD-10-CM | POA: Diagnosis not present

## 2021-03-25 DIAGNOSIS — L570 Actinic keratosis: Secondary | ICD-10-CM | POA: Diagnosis not present

## 2021-03-25 DIAGNOSIS — L409 Psoriasis, unspecified: Secondary | ICD-10-CM | POA: Diagnosis not present

## 2021-03-25 NOTE — Progress Notes (Signed)
    Chronic Care Management Pharmacy Assistant   Name: Brittany Myers  MRN: [id number] DOB: 1951/08/31   Medications: Outpatient Encounter Medications as of 03/04/2021  Medication Sig   acetaminophen (TYLENOL) 500 MG tablet Take 500 mg by mouth every 6 (six) hours as needed.   alendronate (FOSAMAX) 70 MG tablet Take 1 tablet (70 mg total) by mouth once a week. Take with a full glass of water on an empty stomach.   clobetasol cream (TEMOVATE) 7.09 % Apply 1 application topically as needed.   EPINEPHrine 0.3 mg/0.3 mL IJ SOAJ injection Inject 0.3 mLs (0.3 mg total) into the muscle as needed.   estradiol (ESTRACE) 0.1 MG/GM vaginal cream Place 1 Applicatorful vaginally 2 (two) times a week.    LORazepam (ATIVAN) 0.5 MG tablet take 1 tablet by mouth every 8 hours if needed for anxiety   Menaquinone-7 (VITAMIN K2) 100 MCG CAPS Take 1 tablet by mouth daily.   metroNIDAZOLE (METROGEL) 0.75 % gel Apply 1 application topically at bedtime.   Multiple Minerals-Vitamins (CALCIUM CITRATE +) TABS Taking daily   Multiple Vitamin (MULTIVITAMIN) capsule Take 1 capsule by mouth daily.   No facility-administered encounter medications on file as of 03/04/2021.   Pharmacist Review  Made 3 attempts to reach patient to review medications and assess health state. Left messages for patient to return call, unable to reach.  [place] Pharmacist Assistant [phone]   Time spent:16

## 2021-04-10 DIAGNOSIS — H524 Presbyopia: Secondary | ICD-10-CM | POA: Diagnosis not present

## 2021-04-10 DIAGNOSIS — H52223 Regular astigmatism, bilateral: Secondary | ICD-10-CM | POA: Diagnosis not present

## 2021-04-10 DIAGNOSIS — H5203 Hypermetropia, bilateral: Secondary | ICD-10-CM | POA: Diagnosis not present

## 2021-04-10 DIAGNOSIS — H2513 Age-related nuclear cataract, bilateral: Secondary | ICD-10-CM | POA: Diagnosis not present

## 2021-04-30 DIAGNOSIS — N952 Postmenopausal atrophic vaginitis: Secondary | ICD-10-CM | POA: Diagnosis not present

## 2021-04-30 DIAGNOSIS — Z01411 Encounter for gynecological examination (general) (routine) with abnormal findings: Secondary | ICD-10-CM | POA: Diagnosis not present

## 2021-04-30 DIAGNOSIS — Z124 Encounter for screening for malignant neoplasm of cervix: Secondary | ICD-10-CM | POA: Diagnosis not present

## 2021-04-30 DIAGNOSIS — Z6822 Body mass index (BMI) 22.0-22.9, adult: Secondary | ICD-10-CM | POA: Diagnosis not present

## 2021-04-30 DIAGNOSIS — L900 Lichen sclerosus et atrophicus: Secondary | ICD-10-CM | POA: Diagnosis not present

## 2021-04-30 DIAGNOSIS — N3946 Mixed incontinence: Secondary | ICD-10-CM | POA: Diagnosis not present

## 2021-04-30 DIAGNOSIS — Z01419 Encounter for gynecological examination (general) (routine) without abnormal findings: Secondary | ICD-10-CM | POA: Diagnosis not present

## 2021-04-30 DIAGNOSIS — M810 Age-related osteoporosis without current pathological fracture: Secondary | ICD-10-CM | POA: Diagnosis not present

## 2021-04-30 DIAGNOSIS — Z779 Other contact with and (suspected) exposures hazardous to health: Secondary | ICD-10-CM | POA: Diagnosis not present

## 2021-05-02 DIAGNOSIS — Z1231 Encounter for screening mammogram for malignant neoplasm of breast: Secondary | ICD-10-CM | POA: Diagnosis not present

## 2021-06-05 DIAGNOSIS — H903 Sensorineural hearing loss, bilateral: Secondary | ICD-10-CM | POA: Diagnosis not present

## 2021-06-05 DIAGNOSIS — H8101 Meniere's disease, right ear: Secondary | ICD-10-CM | POA: Diagnosis not present

## 2021-06-05 DIAGNOSIS — H838X3 Other specified diseases of inner ear, bilateral: Secondary | ICD-10-CM | POA: Diagnosis not present

## 2021-06-05 DIAGNOSIS — H9311 Tinnitus, right ear: Secondary | ICD-10-CM | POA: Diagnosis not present

## 2021-07-10 ENCOUNTER — Ambulatory Visit (INDEPENDENT_AMBULATORY_CARE_PROVIDER_SITE_OTHER): Payer: Medicare Other

## 2021-07-10 ENCOUNTER — Other Ambulatory Visit: Payer: Self-pay

## 2021-07-10 DIAGNOSIS — Z23 Encounter for immunization: Secondary | ICD-10-CM | POA: Diagnosis not present

## 2021-07-17 ENCOUNTER — Telehealth: Payer: Self-pay

## 2021-07-17 NOTE — Progress Notes (Signed)
    Chronic Care Management Pharmacy Assistant   Name: Brittany Myers  MRN: [id number] DOB: Oct 16, 1950   Reason for Encounter: Disease State   Conditions to be addressed/monitored: General   Recent office visits:  None ID  Recent consult visits:  None ID  Hospital visits:  None in previous 6 months  Medications: Outpatient Encounter Medications as of 07/17/2021  Medication Sig   acetaminophen (TYLENOL) 500 MG tablet Take 500 mg by mouth every 6 (six) hours as needed.   alendronate (FOSAMAX) 70 MG tablet Take 1 tablet (70 mg total) by mouth once a week. Take with a full glass of water on an empty stomach.   clobetasol cream (TEMOVATE) 4.09 % Apply 1 application topically as needed.   EPINEPHrine 0.3 mg/0.3 mL IJ SOAJ injection Inject 0.3 mLs (0.3 mg total) into the muscle as needed.   estradiol (ESTRACE) 0.1 MG/GM vaginal cream Place 1 Applicatorful vaginally 2 (two) times a week.    LORazepam (ATIVAN) 0.5 MG tablet take 1 tablet by mouth every 8 hours if needed for anxiety   Menaquinone-7 (VITAMIN K2) 100 MCG CAPS Take 1 tablet by mouth daily.   metroNIDAZOLE (METROGEL) 0.75 % gel Apply 1 application topically at bedtime.   Multiple Minerals-Vitamins (CALCIUM CITRATE +) TABS Taking daily   Multiple Vitamin (MULTIVITAMIN) capsule Take 1 capsule by mouth daily.   No facility-administered encounter medications on file as of 07/17/2021.   Have you had any problems recently with your health? Patient states that she does not have any knew health issues  Have you had any problems with your pharmacy? Patient states that she does not have any problems with getting medications or the cost of medications from the pharmacy  What issues or side effects are you having with your medications? Patient states that she does not have any side effects from the pharmacy  What would you like me to pass along to [facility] for them to help you with? Patient states that she is doing fine ans  has no concerns for her health or meds  What can we do to take care of you better? Patient states, nothing at this time  Care Gaps: Colonoscopy-03/24/12 Diabetic Foot Exam-NA Mammogram-04/20/19 Ophthalmology-NA Dexa Scan - 10/17/19 Annual Well Visit - NA Micro albumin-NA Hemoglobin A1c- NA  Star Rating Drugs: None ID  Ethelene Hal Clinical Pharmacist Assistant [phone]

## 2021-07-23 IMAGING — DX DG FOOT COMPLETE 3+V*L*
3 series · 3 of 3 positions shown · non-contrast
Comparison: None.

CLINICAL DATA: Left foot pain after fall last week.

EXAM:
LEFT FOOT - COMPLETE 3+ VIEW

[foot ap]
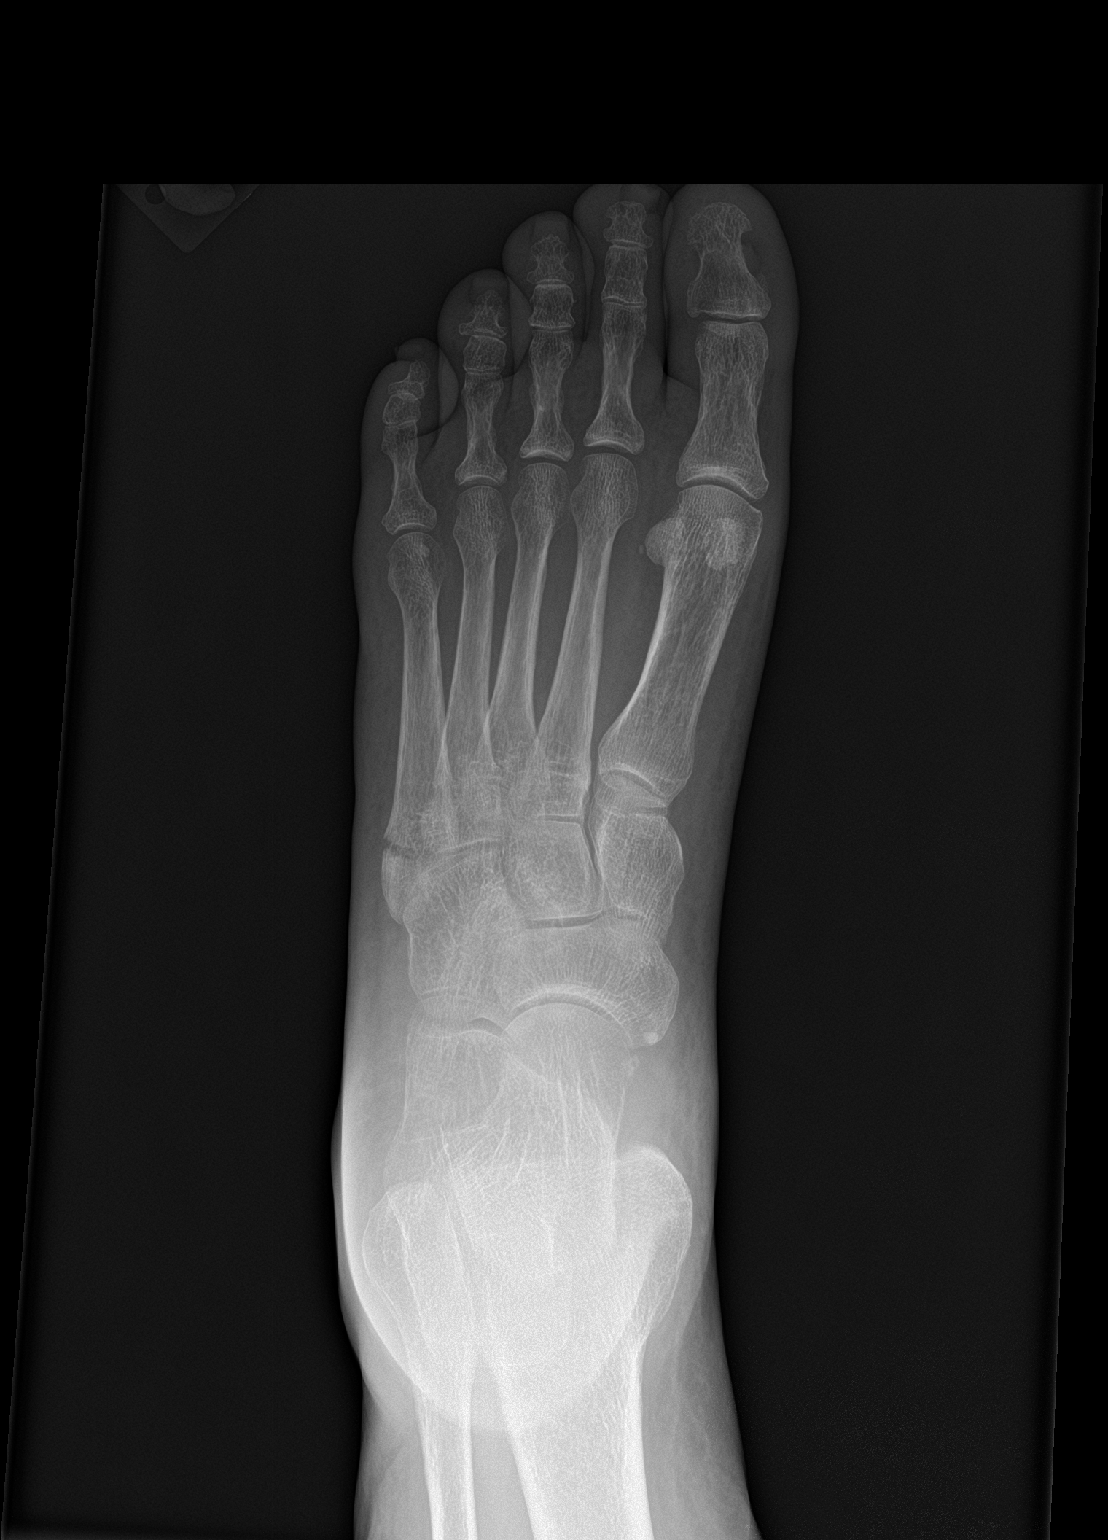

[foot obl]
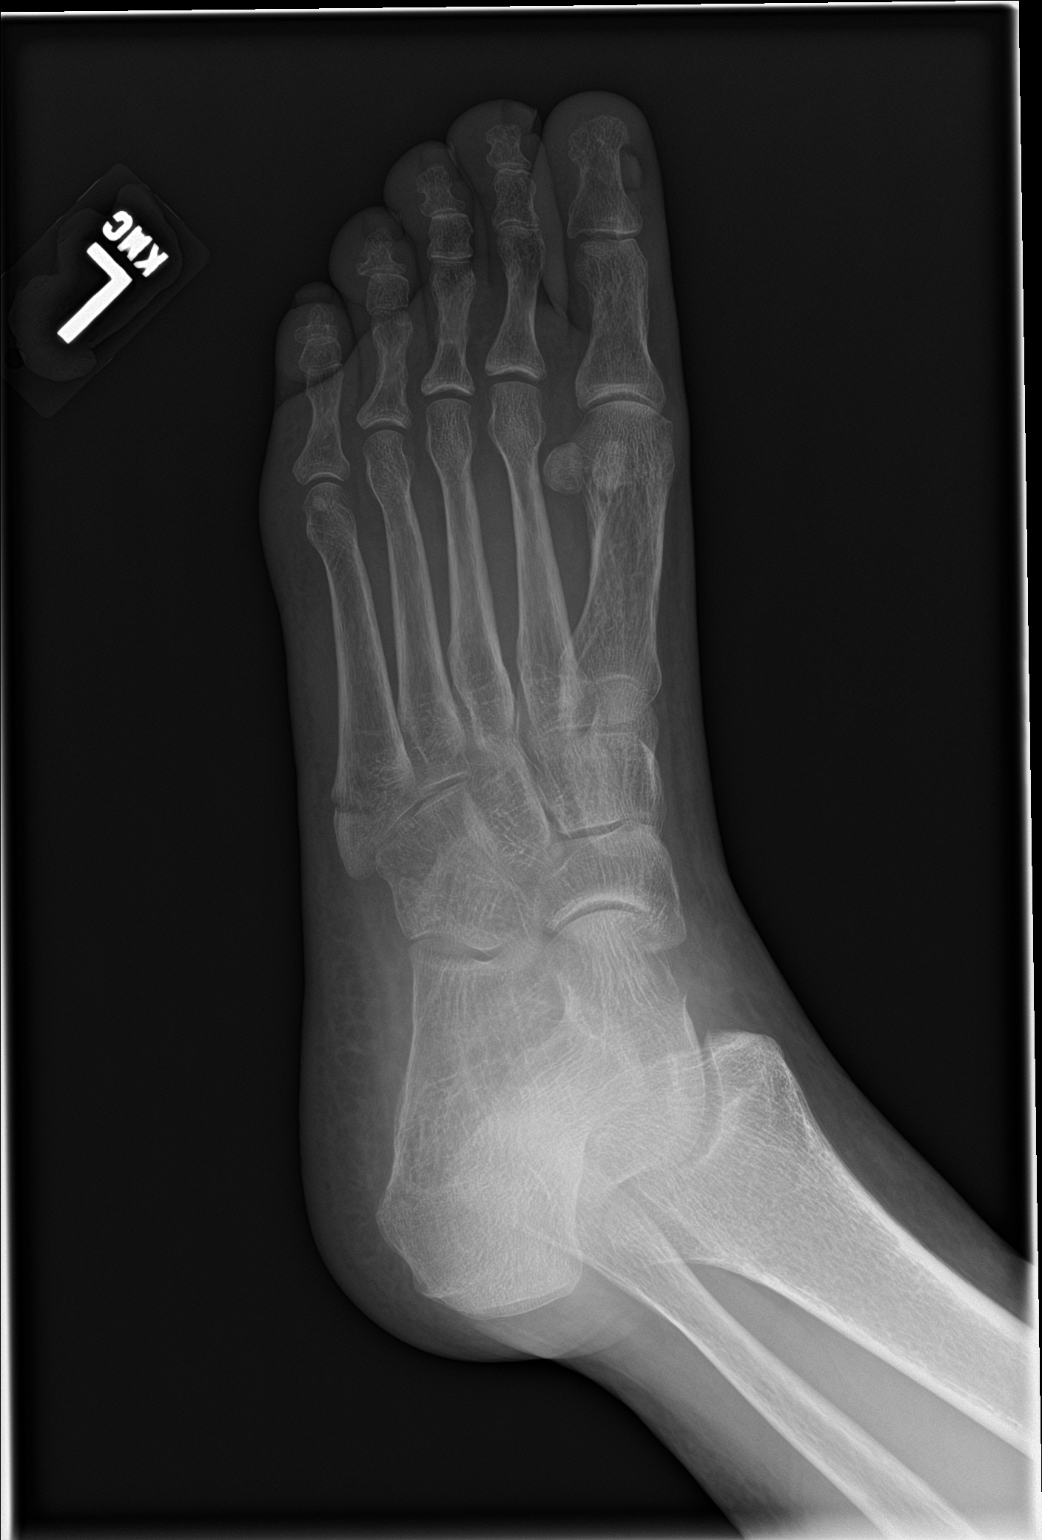

[foot lat]
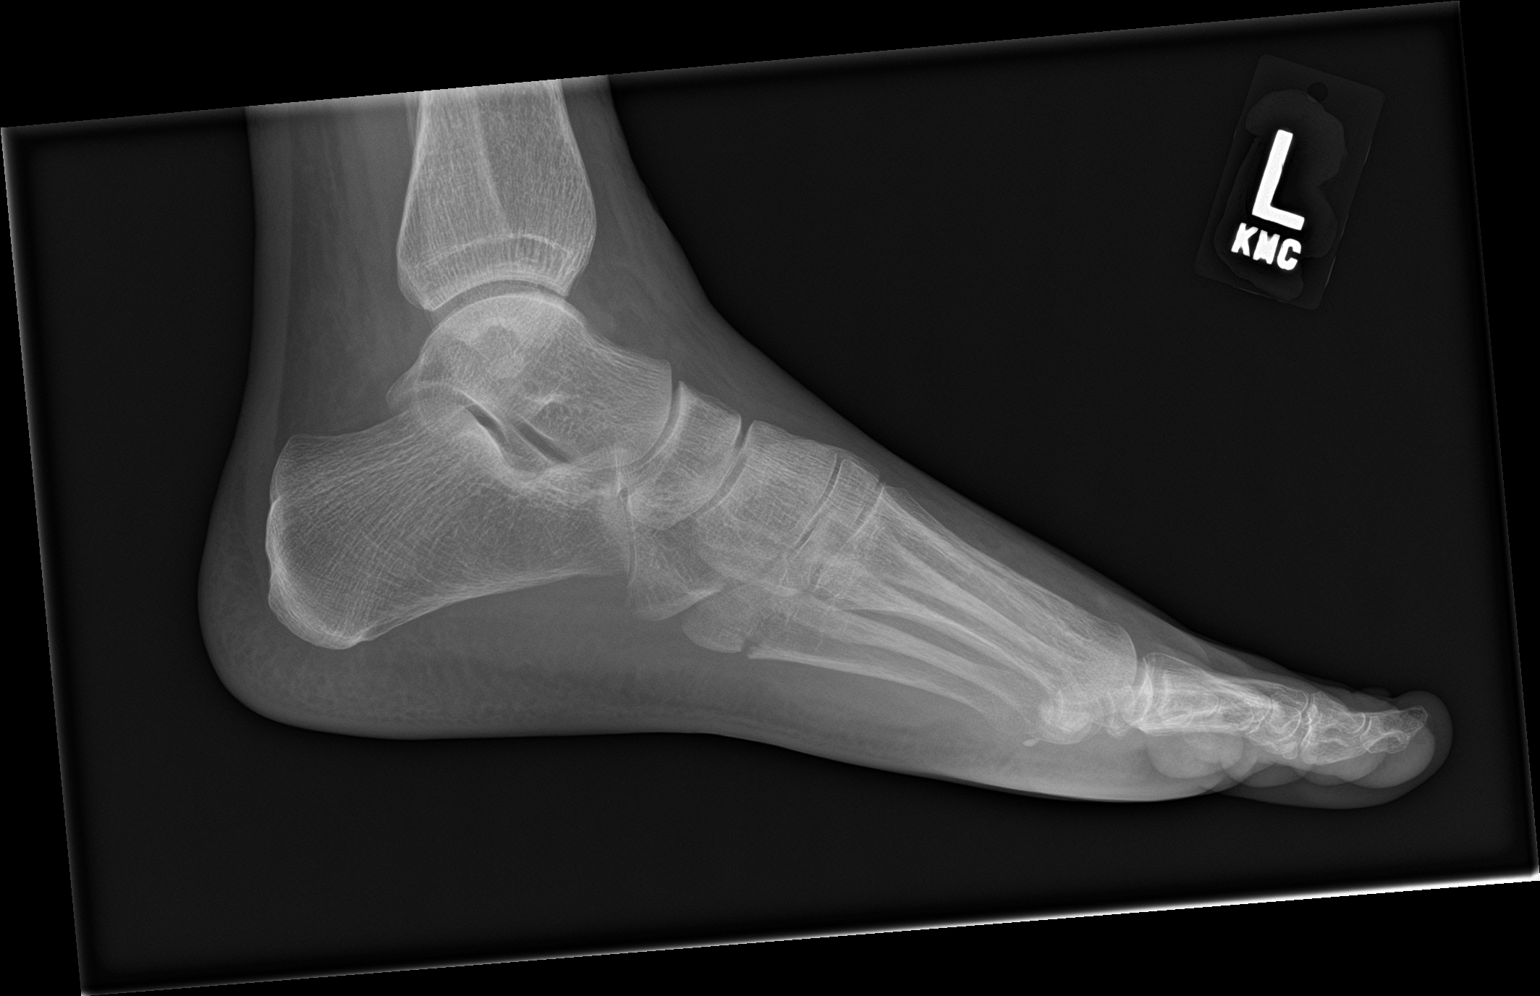

[3 of 3 positions shown; findings below may reference images not displayed]

FINDINGS: Mildly displaced fracture is seen involving the proximal base of the
fifth metatarsal. No other bony abnormality is noted. Joint spaces
are intact. No soft tissue abnormality is noted.
IMPRESSION: Mildly displaced proximal fifth metatarsal fracture.

## 2021-07-29 ENCOUNTER — Telehealth (INDEPENDENT_AMBULATORY_CARE_PROVIDER_SITE_OTHER): Payer: Medicare Other | Admitting: Nurse Practitioner

## 2021-07-29 ENCOUNTER — Encounter: Payer: Self-pay | Admitting: Nurse Practitioner

## 2021-07-29 ENCOUNTER — Other Ambulatory Visit: Payer: Self-pay

## 2021-07-29 VITALS — BP 136/85 | HR 86

## 2021-07-29 DIAGNOSIS — J069 Acute upper respiratory infection, unspecified: Secondary | ICD-10-CM | POA: Diagnosis not present

## 2021-07-29 MED ORDER — LEVOCETIRIZINE DIHYDROCHLORIDE 5 MG PO TABS: 5.0000 mg | ORAL_TABLET | Freq: Every evening | ORAL | 0 refills | Status: DC

## 2021-07-29 MED ORDER — FLUTICASONE PROPIONATE 50 MCG/ACT NA SUSP: 2.0000 | Freq: Every day | NASAL | 0 refills | Status: DC

## 2021-07-29 NOTE — Assessment & Plan Note (Addendum)
Discussed with patient about different treatment options.  Patient seems to be improving per her report and after further questioning.  Does sound viral in nature.  Patient states negative for COVID and has been vaccinated along with the flu vaccine.  We will do symptomatic treatment and hold off on antibiotic treatment patient is aware and agreed plan.  She did have an episode of chest pain that self resolved this morning.  No chest pain on office visit.  Gave strict signs and symptoms as to when to be seen in urgent or emergency care setting.  Patient acknowledged.  Patient will message via MyChart and next couple days if she does not continue to improve like she is doing.  We will pursue an antibiotic that point likely doxycycline.  Patient does have minocycline listed for adverse reactions.  But listed as photosensitivity which is an adverse drug effect listed by the manufacturer.  If going to send him doxycycline will verify allergy with patient

## 2021-07-29 NOTE — Progress Notes (Signed)
Patient ID: Brittany Myers, female    DOB: Jun 24, 1951, 70 y.o.   MRN: [id number]  Virtual visit completed through [employer], a video enabled telemedicine application. Due to national recommendations of social distancing due to COVID-19, a virtual visit is felt to be most appropriate for this patient at this time. Reviewed limitations, risks, security and privacy concerns of performing a virtual visit and the availability of in person appointments. I also reviewed that there may be a patient responsible charge related to this service. The patient agreed to proceed.   Patient location: home Provider location: [place] [facility], office Persons participating in this virtual visit: patient, provider   If any vitals were documented, they were collected by patient at home unless specified below.    BP 136/85   Pulse 86    CC: Sinus Symptoms Subjective:   HPI: Brittany Myers is a 70 y.o. female presenting on 07/29/2021 for Headache (Has had respiratory sx x 1 week, head congestion, teeth pain, sinus pain, cough, chest pain this morning and also some pain mid upper abd pain-pt thinks it could be from nsaids she has been taking. Covid test neg on 07/27/2021. No fever. )  Symptoms started 07/22/2021 Covid tested 07/27/2021 negative Vaccinated x2 with a booster  Flu vaccine Has used tylenol with mild relief Alka Seltzer plus    Patient did have chest pain but has resolved at time of office visit (video)     Relevant past medical, surgical, family and social history reviewed and updated as indicated. Interim medical history since our last visit reviewed. Allergies and medications reviewed and updated. Outpatient Medications Prior to Visit  Medication Sig Dispense Refill   acetaminophen (TYLENOL) 500 MG tablet Take 500 mg by mouth every 6 (six) hours as needed.     alendronate (FOSAMAX) 70 MG tablet Take 1 tablet (70 mg total) by mouth once a week. Take with a full glass of water on  an empty stomach. 12 tablet 3   clobetasol cream (TEMOVATE) 3.24 % Apply 1 application topically as needed.     EPINEPHrine 0.3 mg/0.3 mL IJ SOAJ injection Inject 0.3 mLs (0.3 mg total) into the muscle as needed. 2 each 0   estradiol (ESTRACE) 0.1 MG/GM vaginal cream Place 1 Applicatorful vaginally 2 (two) times a week.      LORazepam (ATIVAN) 0.5 MG tablet take 1 tablet by mouth every 8 hours if needed for anxiety 30 tablet 2   Menaquinone-7 (VITAMIN K2) 100 MCG CAPS Take 1 tablet by mouth daily.     Multiple Minerals-Vitamins (CALCIUM CITRATE +) TABS Taking daily  0   Multiple Vitamin (MULTIVITAMIN) capsule Take 1 capsule by mouth daily.     metroNIDAZOLE (METROGEL) 0.75 % gel Apply 1 application topically at bedtime.     No facility-administered medications prior to visit.     Per HPI unless specifically indicated in ROS section below Review of Systems  Constitutional:  Positive for fatigue. Negative for chills and fever.  HENT:  Positive for congestion, ear pain, postnasal drip and sneezing. Negative for sinus pressure, sinus pain and sore throat.        Teeth pain   Respiratory:  Positive for cough. Negative for shortness of breath.   Cardiovascular:  Positive for chest pain (central chest pain sharp that was consistent).  Gastrointestinal:  Negative for abdominal pain, diarrhea, nausea and vomiting.  Musculoskeletal:  Negative for arthralgias and myalgias.  Neurological:  Positive for headaches.  Objective:  BP 136/85   Pulse 86   Wt Readings from Last 3 Encounters:  12/18/20 128 lb (58.1 kg)  11/27/20 130 lb 3.2 oz (59.1 kg)  09/04/20 131 lb 3.2 oz (59.5 kg)      Physical exam: Gen: alert, NAD, not ill appearing Pulm: speaks in complete sentences without increased work of breathing Psych: normal mood, normal thought content      Results for orders placed or performed in visit on 12/19/20  Vitamin B12  Result Value Ref Range   Vitamin B-12 686 211 - 911 pg/mL  VITAMIN  D 25 Hydroxy (Vit-D Deficiency, Fractures)  Result Value Ref Range   VITD 58.63 30.00 - 100.00 ng/mL  TSH  Result Value Ref Range   TSH 2.49 0.35 - 4.50 uIU/mL  Lipid panel  Result Value Ref Range   Cholesterol 187 0 - 200 mg/dL   Triglycerides 74.0 0.0 - 149.0 mg/dL   HDL 64.40 >39.00 mg/dL   VLDL 14.8 0.0 - 40.0 mg/dL   LDL Cholesterol 108 (H) 0 - 99 mg/dL   Total CHOL/HDL Ratio 3    NonHDL 122.30   Comprehensive metabolic panel  Result Value Ref Range   Sodium 142 135 - 145 mEq/L   Potassium 4.2 3.5 - 5.1 mEq/L   Chloride 107 96 - 112 mEq/L   CO2 28 19 - 32 mEq/L   Glucose, Bld 97 70 - 99 mg/dL   BUN 17 6 - 23 mg/dL   Creatinine, Ser 0.79 0.40 - 1.20 mg/dL   Total Bilirubin 0.7 0.2 - 1.2 mg/dL   Alkaline Phosphatase 45 39 - 117 U/L   AST 19 0 - 37 U/L   ALT 14 0 - 35 U/L   Total Protein 6.5 6.0 - 8.3 g/dL   Albumin 4.3 3.5 - 5.2 g/dL   GFR 76.16 >60.00 mL/min   Calcium 8.9 8.4 - 10.5 mg/dL  CBC with Differential/Platelet  Result Value Ref Range   WBC 5.5 4.0 - 10.5 K/uL   RBC 4.66 3.87 - 5.11 Mil/uL   Hemoglobin 14.9 12.0 - 15.0 g/dL   HCT 42.7 36.0 - 46.0 %   MCV 91.6 78.0 - 100.0 fl   MCHC 34.8 30.0 - 36.0 g/dL   RDW 12.5 11.5 - 15.5 %   Platelets 214.0 150.0 - 400.0 K/uL   Neutrophils Relative % 53.3 43.0 - 77.0 %   Lymphocytes Relative 34.2 12.0 - 46.0 %   Monocytes Relative 8.8 3.0 - 12.0 %   Eosinophils Relative 2.9 0.0 - 5.0 %   Basophils Relative 0.8 0.0 - 3.0 %   Neutro Abs 2.9 1.4 - 7.7 K/uL   Lymphs Abs 1.9 0.7 - 4.0 K/uL   Monocytes Absolute 0.5 0.1 - 1.0 K/uL   Eosinophils Absolute 0.2 0.0 - 0.7 K/uL   Basophils Absolute 0.0 0.0 - 0.1 K/uL   Assessment & Plan:   Problem List Items Addressed This Visit       Respiratory   Upper respiratory tract infection - Primary    Discussed with patient about different treatment options.  Patient seems to be improving per her report and after further questioning.  Does sound viral in nature.  Patient  states negative for COVID and has been vaccinated along with the flu vaccine.  We will do symptomatic treatment and hold off on antibiotic treatment patient is aware and agreed plan.  She did have an episode of chest pain that self resolved this morning.  No chest pain on  office visit.  Gave strict signs and symptoms as to when to be seen in urgent or emergency care setting.  Patient acknowledged.  Patient will message via MyChart and next couple days if she does not continue to improve like she is doing.  We will pursue an antibiotic that point likely doxycycline.  Patient does have minocycline listed for adverse reactions.  But listed as photosensitivity which is an adverse drug effect listed by the manufacturer.  If going to send him doxycycline will verify allergy with patient        Relevant Medications   fluticasone (FLONASE) 50 MCG/ACT nasal spray   levocetirizine (XYZAL) 5 MG tablet     No orders of the defined types were placed in this encounter.  No orders of the defined types were placed in this encounter.   I discussed the assessment and treatment plan with the patient. The patient was provided an opportunity to ask questions and all were answered. The patient agreed with the plan and demonstrated an understanding of the instructions. The patient was advised to call back or seek an in-person evaluation if the symptoms worsen or if the condition fails to improve as anticipated.  Follow up plan: No follow-ups on file.  Romilda Garret, NP

## 2021-08-13 ENCOUNTER — Encounter: Payer: Self-pay | Admitting: Family Medicine

## 2021-08-13 ENCOUNTER — Ambulatory Visit: Payer: Self-pay

## 2021-08-13 ENCOUNTER — Ambulatory Visit (INDEPENDENT_AMBULATORY_CARE_PROVIDER_SITE_OTHER): Payer: Medicare Other | Admitting: Family Medicine

## 2021-08-13 VITALS — BP 120/80 | Ht 64.0 in | Wt 128.0 lb

## 2021-08-13 DIAGNOSIS — M23203 Derangement of unspecified medial meniscus due to old tear or injury, right knee: Secondary | ICD-10-CM | POA: Diagnosis not present

## 2021-08-13 DIAGNOSIS — M25561 Pain in right knee: Secondary | ICD-10-CM

## 2021-08-13 MED ORDER — MELOXICAM 7.5 MG PO TABS: 7.5000 mg | ORAL_TABLET | Freq: Two times a day (BID) | ORAL | 1 refills | Status: DC | PRN

## 2021-08-13 NOTE — Patient Instructions (Signed)
Good to see you Please use ice as needed  Please try the compression  Please try the exercises  Please use the mobic for 5 days straight and then as needed  Please send me a message in MyChart with any questions or updates.  Please see me back in 4 weeks.   --Dr. Raeford Razor

## 2021-08-13 NOTE — Progress Notes (Signed)
Brittany Myers - 70 y.o. female MRN [id number]  Date of birth: 09-Nov-1950  SUBJECTIVE:  Including CC & ROS.  No chief complaint on file.   Brittany Myers is a 70 y.o. female that is presenting with acute right medial knee pain.  No specific injury.  She was walking around during college campuses recently.    Review of Systems See HPI   HISTORY: Past Medical, Surgical, Social, and Family History Reviewed & Updated per EMR.   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   Endometriosis    Increased homocysteine    high-normal   Osteopenia    T score ; -2.1  @ L femoral neck in 2006   Plantar fasciitis     Past Surgical History:  Procedure Laterality Date   COLONOSCOPY  2013   Dr Sharlett Iles   HEMORRHOID SURGERY     LAPAROSCOPIC ENDOMETRIOSIS FULGURATION     5 surgeries; Dr Irven [place], [place]   POLYPECTOMY  2001    Dr.Sam [place]     Family History  Problem Relation Age of Onset   Hyperlipidemia Brother    Heart failure Father        heart attack @ 64 after CVA   Stroke Father 84       hemorrhagic  after MI   Diabetes Father    Endometriosis Mother    Hyperlipidemia Mother    Osteopenia Mother    Heart attack Maternal Grandfather 101   Liver cancer Maternal Aunt    Colon cancer Paternal Grandmother    Alzheimer's disease Maternal Grandmother     Social History   Socioeconomic History   Marital status: Married    Spouse name: Not on file   Number of children: 0   Years of education: Not on file   Highest education level: Bachelor's degree (e.g., BA, AB, BS)  Occupational History   Occupation: Human resources officer  Tobacco Use   Smoking status: Never   Smokeless tobacco: Never  Vaping Use   Vaping Use: Never used  Substance and Sexual Activity   Alcohol use: Yes    Comment:   3/ week   Drug use: No   Sexual activity: Yes  Other Topics Concern   Not on file  Social History  Narrative   Heart healthy diet   Regular Exercise-yes (gym 3 times a week)   Social Determinants of Health   Financial Resource Strain: Not on file  Food Insecurity: No Food Insecurity   Worried About Charity fundraiser in the Last Year: Never true   Ran Out of Food in the Last Year: Never true  Transportation Needs: No Transportation Needs   Lack of Transportation (Medical): No   Lack of Transportation (Non-Medical): No  Physical Activity: Sufficiently Active   Days of Exercise per Week: 5 days   Minutes of Exercise per Session: 30 min  Stress: Stress Concern Present   Feeling of Stress : Rather much  Social Connections: Socially Integrated   Frequency of Communication with Friends and Family: More than three times a week   Frequency of Social Gatherings with Friends and Family: More than three times a week   Attends Religious Services: More than 4 times per year   Active Member of [employer] or Organizations: Yes   Attends Music therapist: More than 4 times per year   Marital Status: Married  Human resources officer Violence: Not  on file     PHYSICAL EXAM:  VS: BP 120/80 (BP Location: Left Arm, Patient Position: Sitting)   Ht 5\' 4"  (1.626 m)   Wt 128 lb (58.1 kg)   BMI 21.97 kg/m  Physical Exam Gen: NAD, alert, cooperative with exam, well-appearing   Limited ultrasound: Right knee:  Mild effusion in suprapatellar pouch. Normal-appearing quadricep and patellar tendon. Degenerative changes of the lateral meniscus with increased hyperemia. No joint space narrowing and degenerative changes of the medial meniscus with hyperemia  Summary: Degenerative meniscal changes  Ultrasound and interpretation by Clearance Coots, MD     ASSESSMENT & PLAN:   Degenerative tear of medial meniscus of right knee Degenerative changes of meniscus with acute inflammatory response.  Effusion noted on exam. -Counseled on home exercise therapy and supportive care. -Mobic. -Counseled  on compression. -Could consider injection or imaging.

## 2021-08-13 NOTE — Assessment & Plan Note (Signed)
Degenerative changes of meniscus with acute inflammatory response.  Effusion noted on exam. -Counseled on home exercise therapy and supportive care. -Mobic. -Counseled on compression. -Could consider injection or imaging.

## 2021-08-28 DIAGNOSIS — D1801 Hemangioma of skin and subcutaneous tissue: Secondary | ICD-10-CM | POA: Diagnosis not present

## 2021-08-28 DIAGNOSIS — L812 Freckles: Secondary | ICD-10-CM | POA: Diagnosis not present

## 2021-08-28 DIAGNOSIS — L821 Other seborrheic keratosis: Secondary | ICD-10-CM | POA: Diagnosis not present

## 2021-08-28 DIAGNOSIS — Z85828 Personal history of other malignant neoplasm of skin: Secondary | ICD-10-CM | POA: Diagnosis not present

## 2021-08-28 DIAGNOSIS — L601 Onycholysis: Secondary | ICD-10-CM | POA: Diagnosis not present

## 2021-08-29 ENCOUNTER — Encounter: Payer: Self-pay | Admitting: Internal Medicine

## 2021-08-29 DIAGNOSIS — R251 Tremor, unspecified: Secondary | ICD-10-CM

## 2021-08-30 MED ORDER — LORAZEPAM 0.5 MG PO TABS: ORAL_TABLET | ORAL | 2 refills | Status: DC

## 2021-09-06 ENCOUNTER — Telehealth: Payer: Self-pay | Admitting: Internal Medicine

## 2021-09-06 ENCOUNTER — Encounter: Payer: Self-pay | Admitting: Internal Medicine

## 2021-09-06 NOTE — Telephone Encounter (Signed)
She needs to do a visit.  She can do an E-visit and not leave her house

## 2021-09-06 NOTE — Telephone Encounter (Signed)
Patient states she is having an excessive dry cough at night since 12-1  Patient requesting rx for symptoms  Encourage patient to schedule a vv or ov, patient declined  Please advise

## 2021-09-06 NOTE — Telephone Encounter (Signed)
Hello Ms Pech,   Dr Quay Burow has referred you to see a neurologist. [facility] has been trying to contact you to schedule. Please contact  [facility] to schedule at [phone].  Thank Tessa Lerner Patient Care Coordinator

## 2021-09-09 NOTE — Telephone Encounter (Signed)
Left message for patient that she could set up E-visit or keep appointment already scheduled with Dr. Quay Burow if she wanted to wait it out.

## 2021-09-10 ENCOUNTER — Ambulatory Visit: Payer: Medicare Other | Admitting: Family Medicine

## 2021-09-10 NOTE — Progress Notes (Addendum)
Subjective:    Patient ID: Brittany Myers, female    DOB: 01-07-1951, 70 y.o.   MRN: [id number]  This visit occurred during the SARS-CoV-2 public health emergency.  Safety protocols were in place, including screening questions prior to the visit, additional usage of staff PPE, and extensive cleaning of exam room while observing appropriate contact time as indicated for disinfecting solutions.    HPI She is here for an acute visit for cold symptoms.   Her symptoms started 12/1  She is experiencing cough - it was all day but now just mostly at night.  Initially she had a scratchy throat, laryngitis for 5 days, drainage and a more productive cough.  She still has some postnasal drainage, but the cough is just not getting better.    She has tried taking tessalon, promethasone.    Right hand tremor - it has been there for a while.  It is mild.  It is stiff and painful at times.  She has been referred to neuro for further evaluation.  She was unsure if it was arthritis or not.    Medications and allergies reviewed with patient and updated if appropriate.  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     alendronate (FOSAMAX) 70 MG tablet Take 1 tablet (70 mg total) by mouth once a week. Take with a full glass of water on an empty stomach. 12 tablet 3   clobetasol cream (TEMOVATE) 9.02 % Apply 1 application topically as needed.     EPINEPHrine 0.3 mg/0.3 mL IJ SOAJ injection Inject 0.3 mLs (0.3 mg total) into the muscle as needed. 2 each 0   estradiol (ESTRACE) 0.1 MG/GM vaginal cream Place 1 Applicatorful vaginally 2 (two) times a week.      fluticasone (FLONASE) 50 MCG/ACT nasal spray Place 2 sprays into both nostrils daily. 16 g 0   LORazepam (ATIVAN) 0.5 MG tablet take 1 tablet by mouth every 8 hours if needed for anxiety 30 tablet 2   meloxicam (MOBIC) 7.5 MG tablet Take 1 tablet (7.5 mg total) by mouth 2 (two) times daily as needed. 45 tablet 1   Menaquinone-7 (VITAMIN K2) 100 MCG CAPS Take 1 tablet by mouth daily.     Multiple Minerals-Vitamins (CALCIUM CITRATE +) TABS Taking daily  0   Multiple Vitamin (MULTIVITAMIN) capsule Take 1 capsule by mouth daily.     No current facility-administered medications on file prior to visit.    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   Endometriosis    Increased homocysteine  high-normal   Osteopenia    T score ; -2.1  @ L femoral neck in 2006   Plantar fasciitis     Past Surgical History:  Procedure Laterality Date   COLONOSCOPY  2013   Dr Sharlett Iles   HEMORRHOID SURGERY     LAPAROSCOPIC ENDOMETRIOSIS FULGURATION     5 surgeries; Dr Noel Gerold   POLYPECTOMY  2001    Dr.Sam [place]     Social History   Socioeconomic History   Marital status: Married    Spouse name: Not on file   Number of children: 0   Years of education: Not on file   Highest education level: Bachelor's degree (e.g., BA, AB, BS)  Occupational History   Occupation: Human resources officer   Tobacco Use   Smoking status: Never   Smokeless tobacco: Never  Vaping Use   Vaping Use: Never used  Substance and Sexual Activity   Alcohol use: Yes    Comment:   3/ week   Drug use: No   Sexual activity: Yes  Other Topics Concern   Not on file  Social History Narrative   Heart healthy diet   Regular Exercise-yes (gym 3 times a week)   Social Determinants of Health   Financial Resource Strain: Not on file  Food Insecurity: No Food Insecurity   Worried About Charity fundraiser in the Last Year: Never true   Ran Out of Food in the Last Year: Never true  Transportation Needs: No Transportation Needs   Lack of Transportation (Medical): No   Lack of Transportation (Non-Medical): No  Physical Activity: Sufficiently Active   Days of Exercise per Week: 5 days   Minutes of Exercise per Session: 30 min  Stress: Stress Concern Present   Feeling of Stress : Rather much  Social Connections: Socially Integrated   Frequency of Communication with Friends and Family: More than three times a week   Frequency of Social Gatherings with Friends and Family: More than three times a week   Attends Religious Services: More than 4 times per year   Active Member of [employer] or Organizations: Yes   Attends Music therapist: More than 4 times per year   Marital Status: Married    Family History  Problem Relation Age of Onset   Hyperlipidemia Brother    Heart failure Father        heart attack @ 17 after CVA   Stroke Father 85       hemorrhagic  after MI   Diabetes Father    Endometriosis Mother    Hyperlipidemia Mother    Osteopenia Mother    Heart attack Maternal Grandfather 49   Liver cancer Maternal Aunt    Colon cancer Paternal Grandmother    Alzheimer's disease Maternal Grandmother     Review of Systems  Constitutional:  Negative for chills and fever.  HENT:  Positive for postnasal drip (at night). Negative for congestion, ear pain, sinus pain and sore throat.    Respiratory:  Positive for cough. Negative for chest tightness, shortness of breath and wheezing.   Neurological:  Negative for dizziness and headaches.      Objective:   Vitals:   09/11/21 1014  BP: 110/74  Pulse: 78  Temp: 98.2 F (36.8 C)  SpO2: 98%   BP Readings from Last 3 Encounters:  09/11/21 110/74  08/13/21 120/80  07/29/21 136/85   Wt Readings from Last 3 Encounters:  09/11/21 130 lb (59 kg)  08/13/21  128 lb (58.1 kg)  12/18/20 128 lb (58.1 kg)   Body mass index is 22.31 kg/m.   Physical Exam    GENERAL APPEARANCE: Appears stated age, well appearing, NAD EYES: conjunctiva clear, no icterus HENT: bilateral tympanic membranes and ear canals normal, oropharynx with no erythema or exudates, trachea midline, no cervical or supraclavicular lymphadenopathy LUNGS: Unlabored breathing, good air entry bilaterally, clear to auscultation without wheeze or crackles CARDIOVASCULAR: Normal S1,S2 , no edema SKIN: Warm, dry      Assessment & Plan:    See Problem List for Assessment and Plan of chronic medical problems.

## 2021-09-11 ENCOUNTER — Other Ambulatory Visit: Payer: Self-pay

## 2021-09-11 ENCOUNTER — Encounter: Payer: Self-pay | Admitting: Internal Medicine

## 2021-09-11 ENCOUNTER — Ambulatory Visit (INDEPENDENT_AMBULATORY_CARE_PROVIDER_SITE_OTHER): Payer: Medicare Other | Admitting: Internal Medicine

## 2021-09-11 DIAGNOSIS — R051 Acute cough: Secondary | ICD-10-CM

## 2021-09-11 MED ORDER — METHYLPREDNISOLONE 4 MG PO TBPK: ORAL_TABLET | ORAL | 0 refills | Status: DC

## 2021-09-11 MED ORDER — HYDROCODONE BIT-HOMATROP MBR 5-1.5 MG/5ML PO SOLN: 5.0000 mL | Freq: Four times a day (QID) | ORAL | 0 refills | Status: DC | PRN

## 2021-09-11 MED ORDER — BENZONATATE 200 MG PO CAPS: 200.0000 mg | ORAL_CAPSULE | Freq: Three times a day (TID) | ORAL | 0 refills | Status: DC | PRN

## 2021-09-11 NOTE — Patient Instructions (Addendum)
° °  Medications changes include :   steroid taper, cough suppressants.   Your prescription(s) have been submitted to your pharmacy. Please take as directed and contact our office if you believe you are having problem(s) with the medication(s).

## 2021-09-11 NOTE — Assessment & Plan Note (Signed)
Acute Has been going on for couple of weeks-residual from URI/some reactive airway disease I do not think she has an active bacterial infection so we will hold off on any antibiotics Medrol Dosepak Can continue Flonase, over-the-counter oral antihistamine Tessalon Perles as needed for cough, Hydromet cough syrup for nighttime if needed She will call if there is no improvement

## 2021-09-19 ENCOUNTER — Encounter: Payer: Self-pay | Admitting: Internal Medicine

## 2021-09-23 IMAGING — DX DG FOOT COMPLETE 3+V*L*
3 series · 3 of 3 positions shown · non-contrast
Comparison: 08/19/2019

CLINICAL DATA: Fall July 2019 fracture recheck.

EXAM:
LEFT FOOT - COMPLETE 3+ VIEW

[foot ap]
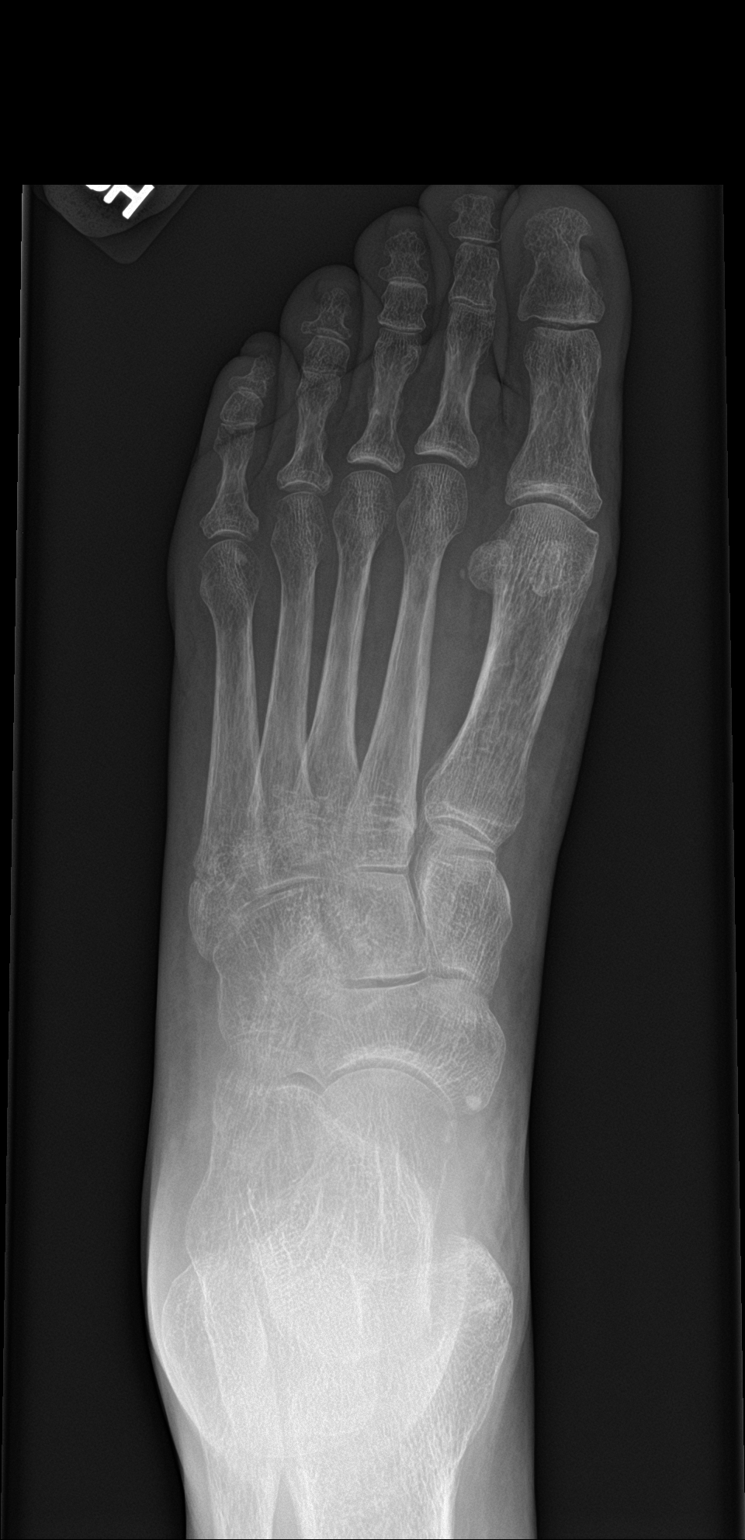

[foot obl]
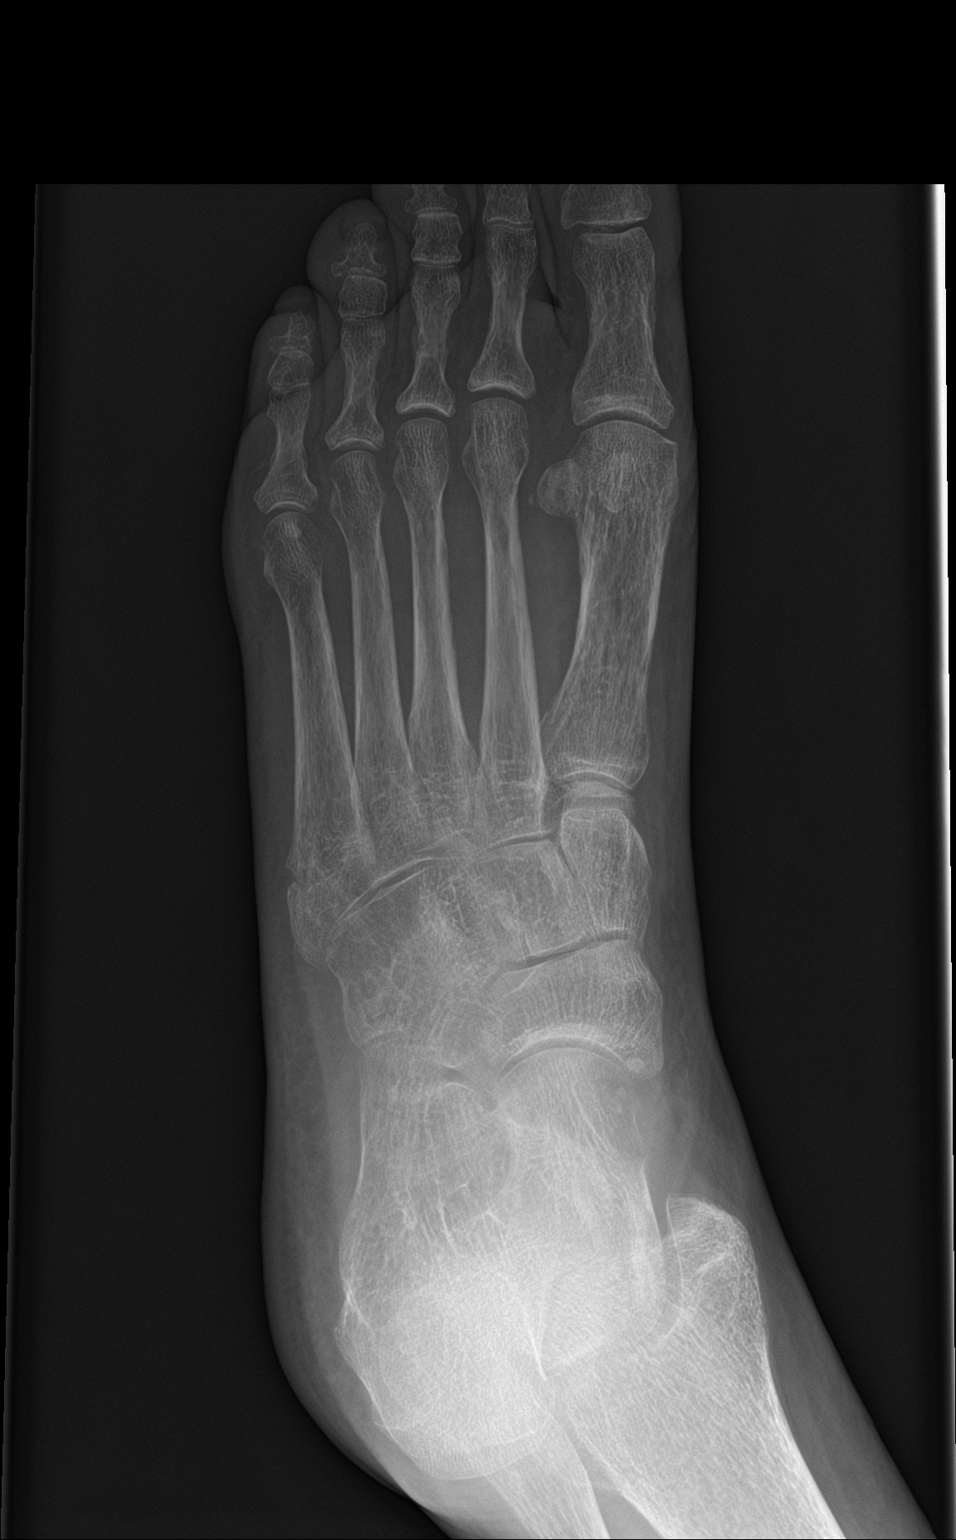

[foot lat]
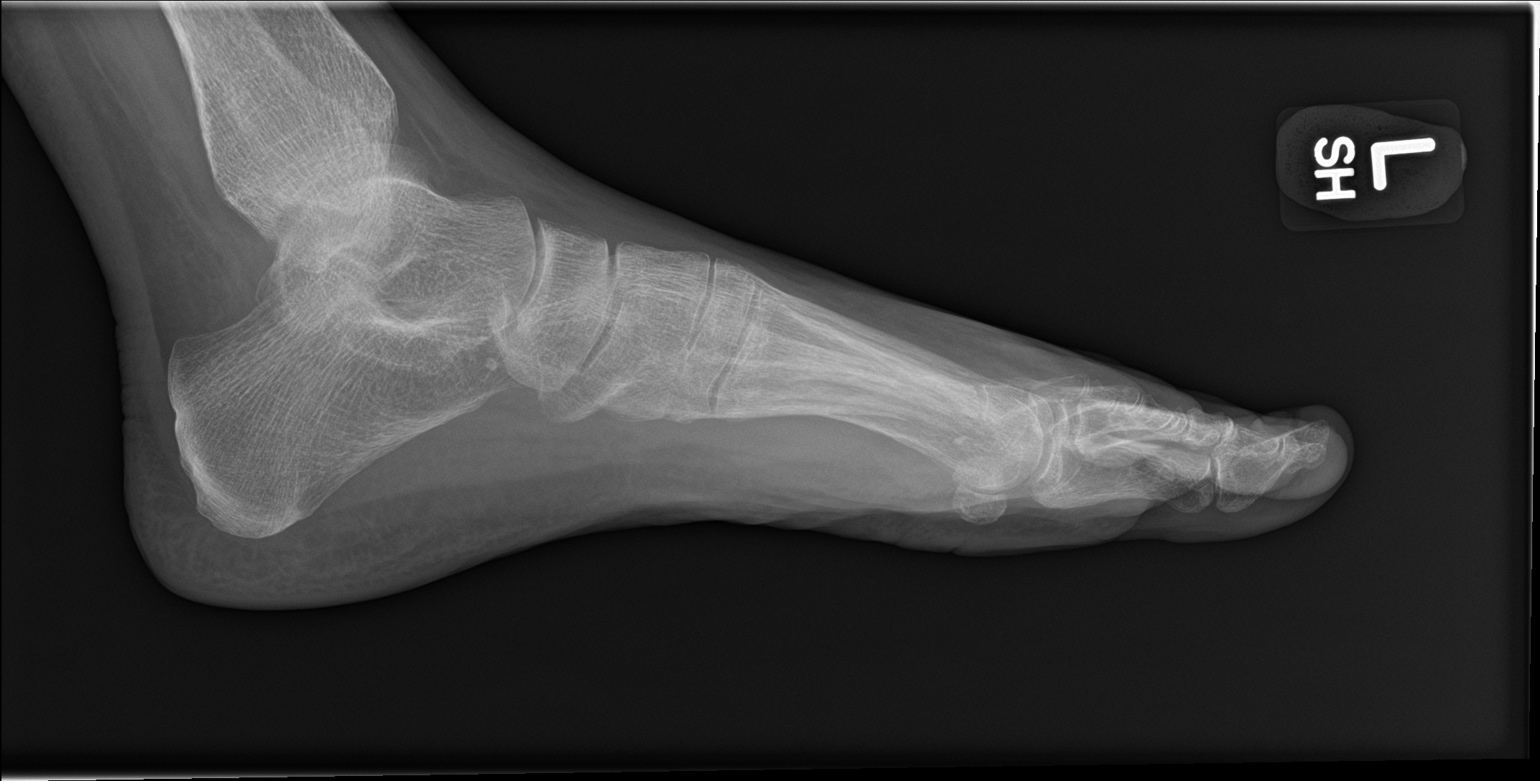

[3 of 3 positions shown; findings below may reference images not displayed]

FINDINGS: There is diffuse osteopenia. There is evidence of interval healing
of patient's fracture at the base of the fifth metatarsal with only
mild residual lucency at the fracture site. Remainder of the exam is
unchanged.
IMPRESSION: Evidence of interval healing of patient's fifth metatarsal fracture
with mild residual lucency at the fracture site.

Osteopenia.

## 2021-11-20 ENCOUNTER — Encounter: Payer: Self-pay | Admitting: Neurology

## 2021-12-02 ENCOUNTER — Ambulatory Visit (INDEPENDENT_AMBULATORY_CARE_PROVIDER_SITE_OTHER): Payer: Medicare Other | Admitting: Family Medicine

## 2021-12-02 ENCOUNTER — Ambulatory Visit: Payer: Self-pay

## 2021-12-02 VITALS — BP 110/60 | Ht 64.0 in | Wt 130.0 lb

## 2021-12-02 DIAGNOSIS — M25561 Pain in right knee: Secondary | ICD-10-CM

## 2021-12-02 DIAGNOSIS — M25461 Effusion, right knee: Secondary | ICD-10-CM | POA: Diagnosis not present

## 2021-12-02 NOTE — Progress Notes (Signed)
?  Brittany Myers - 71 y.o. female MRN [id number]  Date of birth: Mar 23, 1951 ? ?SUBJECTIVE:  Including CC & ROS.  ?No chief complaint on file. ? ? ?Brittany Myers is a 71 y.o. female that is presenting with acute right knee pain.  The pain occurred while she was stepping down a few steps a couple of days ago.  She was having limited range of motion.  Has got improvement with the Aleve. ? ? ?Review of Systems ?See HPI  ? ?HISTORY: Past Medical, Surgical, Social, and Family History Reviewed & Updated per EMR.   ?Pertinent Historical Findings include: ? ?Past Medical History:  ?Diagnosis Date  ? Bruit   ? 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 Endometriosis   ? Increased homocysteine   ? high-normal  ? Osteopenia   ? T score ; -2.1  @ L femoral neck in 2006  ? Plantar fasciitis   ? ? ?Past Surgical History:  ?Procedure Laterality Date  ? COLONOSCOPY  2013  ? Dr Sharlett Iles  ? HEMORRHOID SURGERY    ? LAPAROSCOPIC ENDOMETRIOSIS FULGURATION    ? 5 surgeries; Dr Irven [place], [place]  ? POLYPECTOMY  2001   ? Dr.Sam    ? ? ? ?PHYSICAL EXAM:  ?VS: BP 110/60   Ht '5\' 4"'$  (1.626 m)   Wt 130 lb (59 kg)   BMI 22.31 kg/m?  ?Physical Exam ?Gen: NAD, alert, cooperative with exam, well-appearing ?MSK:  ?Neurovascularly intact   ? ?Limited ultrasound: Right knee: ? ?Moderate effusion in the suprapatellar pouch. ?Increased hyperemia over the medial compartment and lateral compartment. ?No Baker's cyst ? ?Summary: Effusion appreciated ? ?Ultrasound and interpretation by Clearance Coots, MD ? ? ?Aspiration/Injection Procedure Note ?[place] ?12-12-1950 ? ?Procedure: Aspiration ?Indications: Right knee pain ? ?Procedure Details ?Consent: Risks of procedure as well as the alternatives and risks of each were explained to the (patient/caregiver).  Consent for procedure obtained. ?Time Out: Verified patient identification, verified procedure, site/side was marked, verified correct patient position, special equipment/implants available,  medications/allergies/relevent history reviewed, required imaging and test results available.  Performed.  The area was cleaned with iodine and alcohol swabs.   ? ?The right knee superior lateral suprapatellar pouch was injected using 4 cc's of 1% lidocaine with a 25 1 1/2" needle.  An 18-gauge 1-1/2 inch needle was used to achieve aspiration ultrasound was used. Images were obtained in long views showing the injection.   ? ?Amount of Fluid Aspirated:  24m ?Character of Fluid: clear and straw colored ?Fluid was sent for: n/a ? ?A sterile dressing was applied. ? ?Patient did tolerate procedure well. ? ? ? ?ASSESSMENT & PLAN:  ? ?Knee effusion, right ?Acutely occurring.  Does have degenerative changes of the meniscus.  She was feeling better since November until this recently occurred. ?-Counseled on home exercise therapy and supportive care. ?-Aspiration today. ?-Counseled on Aleve. ?-Could consider injection versus physical therapy or further imaging. ? ? ? ? ?

## 2021-12-02 NOTE — Patient Instructions (Signed)
Good to see you ?Please try ice  ?Please try the knee brace  ?Please take 500 mg of aleve two times daily  ?You can take tylenol with the aleve   ?Please send me a message in MyChart with any questions or updates.  ?Please see me back in 4 weeks or sooner if needed.  ? ?--Dr. Raeford Razor ? ?

## 2021-12-02 NOTE — Assessment & Plan Note (Signed)
Acutely occurring.  Does have degenerative changes of the meniscus.  She was feeling better since November until this recently occurred. ?-Counseled on home exercise therapy and supportive care. ?-Aspiration today. ?-Counseled on Aleve. ?-Could consider injection versus physical therapy or further imaging. ?

## 2021-12-03 NOTE — Progress Notes (Signed)
? ?Assessment/Plan:  ? ? R hand weakness, by hx ?While patient initially came with complaints of tremor, it turns out that she really was describing weakness of the right hand and dropping objects.  Objectively, I did not see any weakness when tested. ?Recommended EMG to rule out entrapment neuropathy.  Patient wanted to research this and think about it.  Told her that if she wanted it done at our office, she probably should make a decision quickly, as our neuromuscular specialist will be off for several months, starting in a few weeks. ?Did not see any significant tremor, either essential tremor or Parkinson's disease.  Reassurance provided. ?Slight facial asymmetry, with mild left ptosis and decreased blink  ?Patient had not noticed, but driver's license appeared the same and patient stated that it was years old. ?Patient denied history of toxin use or history of Bell's palsy. ?We will check acetylcholine receptor antibody ?If above negative, we can look at neuroimaging, but given stability of symptoms based on driver's license, decided to hold on that for today. ? ?Subjective:  ? ?Brittany Myers was seen today in the movement disorders clinic for neurologic consultation at the request of Burns, Claudina Lick, MD.  The consultation is for the evaluation of tremor.  She has not previously been seen or evaluated for this.  Medical records reviewed. ? ?Tremor: Yes.    ? How long has it been going on? 10 years ? At rest or with activation?  activation ? When is it noted the most?  Writing, passing of papers, described as hand feels weak and "spastic" ? Fam hx of tremor?  Yes.  , father and paternal father (but his may have been due EtOH) ? Located where?  R hand (she is R handed) ? Affected by caffeine:  No. ? Affected by alcohol:  doesn't drink much right now b/c disturbs her sleep ? Affected by stress:  Yes.   ? Affected by fatigue:  Yes.   ? Spills soup if on spoon:  No. ? Spills glass of liquid if full:  No. ? Affects  ADL's (tying shoes, brushing teeth, etc):  No. ? Tremor inducing meds:  No. ? ?She also notes trouble grasping objects.  No numbness of the hands.  She has trouble opening jars.  She has some pain over the [facility] ? ?Other Specific Symptoms:  ?Voice: no change ?Sleep: trouble maintaining sleep "since menopause" ? Vivid Dreams:  Yes.   (Mostly due to moms death in her car and family trauma after) ?Postural symptoms:  No. (Currently with torn meninscus of R knee) ? Falls?  No. ?Bradykinesia symptoms: no bradykinesia noted ?Loss of smell:  No. ?Loss of taste:  No. ?Urinary Incontinence:  No. ?N/V:  No. ?Lightheaded:  No. ? Syncope: No. ?Diplopia:  No. ?Dyskinesia:  No. ? ?Neuroimaging of the brain has not previously been performed.   ? ? ?ALLERGIES:   ?Allergies  ?Allergen Reactions  ? Ciprofloxacin   ?  REACTION: red ,itchy feet, "furry throat" ?Because of a history of documented adverse serious drug reaction;Medi Alert bracelet  is recommended  ? Penicillins   ?  REACTION: angioedema ?Because of a history of documented adverse serious drug reaction;Medi Alert bracelet  is recommended  ? Terbinafine Hcl   ?  Angioedema & shock 1999 from oral Lamisil  ?Because of a history of documented adverse serious drug reaction;Medi Alert bracelet  is recommended  ? Erythromycin   ?  REACTION: headaches  ? Minocycline Hcl   ?  REACTION: photosensitivity post sun exposure  ? Zolpidem Tartrate   ?  REACTION: "weepy"  ? ? ?CURRENT MEDICATIONS:  ?Current Outpatient Medications  ?Medication Instructions  ? acetaminophen (TYLENOL) 500 mg, Oral, Every 6 hours PRN  ? alendronate (FOSAMAX) 70 mg, Oral, Weekly, Take with a full glass of water on an empty stomach.   ? clobetasol cream (TEMOVATE) 2.35 % 1 application., Topical, As needed  ? EPINEPHrine (EPI-PEN) 0.3 mg, Intramuscular, As needed  ? estradiol (ESTRACE) 0.1 MG/GM vaginal cream 1 Applicatorful, Vaginal, 2 times weekly  ? LORazepam (ATIVAN) 0.5 MG tablet take 1 tablet by mouth every  8 hours if needed for anxiety  ? Menaquinone-7 (VITAMIN K2) 100 MCG CAPS 1 tablet, Oral, Daily  ? Multiple Minerals-Vitamins (CALCIUM CITRATE +) TABS Taking daily  ? Multiple Vitamin (MULTIVITAMIN) capsule 1 capsule, Oral, Daily  ? ? ?Objective:  ? ?PHYSICAL EXAMINATION:   ? ?VITALS:   ?Vitals:  ? 12/05/21 1336  ?Pulse: 72  ?SpO2: 98%  ?Weight: 134 lb 3.2 oz (60.9 kg)  ?Height: '5\' 4"'$  (1.626 m)  ? ? ?GEN:  The patient appears stated age and is in NAD. ?HEENT:  Normocephalic, atraumatic.  The mucous membranes are moist. The superficial temporal arteries are without ropiness or tenderness. ?CV:  RRR ?Lungs:  CTAB ?Neck/HEME:  There are no carotid bruits bilaterally. ? ?Neurological examination: ? ?Orientation: The patient is alert and oriented x3.  ?Cranial nerves: There is good facial symmetry, but there is asymmetric blink with mild left ptosis (patient denies history of Bell's palsy, or history of toxin use -driver's license appears the same as what is seen in person and reports this is years old).  Extraocular muscles are intact. The visual fields are full to confrontational testing. The speech is fluent and clear. Soft palate rises symmetrically and there is no tongue deviation. Hearing is intact to conversational tone. ?Sensation: Sensation is intact to light touch throughout (facial, trunk, extremities). Vibration is intact at the bilateral big toe. There is no extinction with double simultaneous stimulation.  ?Motor: Strength is 5/5 in the bilateral upper and lower extremities.   Shoulder shrug is equal and symmetric.  There is no pronator drift. ?Deep tendon reflexes: Deep tendon reflexes are 2/4 at the bilateral biceps, triceps, brachioradialis, patella and achilles. Plantar responses are downgoing bilaterally. ? ?Movement examination: ?Tone: There is normal tone in the bilateral upper extremities.  The tone in the lower extremities is normal.  ?Abnormal movements: There is no rest tremor.  There is no  postural tremor.  There is no intention tremor.  There is no tremor when pouring water from 1 glass to another.  She has no trouble with Archimedes spirals.  She may have slight tremor when given a weight in the right hand.  She has no dystonia when asked to copy sentences. ?Coordination:  There is no decremation with RAM's, with any form of RAMS, including alternating supination and pronation of the forearm, hand opening and closing, finger taps, heel taps and toe taps. ?Gait and Station: The patient has no difficulty arising out of a deep-seated chair without the use of the hands. The patient's stride length is good.   ?I have reviewed and interpreted the following labs independently ?  Chemistry   ?   ?Component Value Date/Time  ? NA 142 12/19/2020 0948  ? K 4.2 12/19/2020 0948  ? CL 107 12/19/2020 0948  ? CO2 28 12/19/2020 0948  ? BUN 17 12/19/2020 0948  ? CREATININE 0.79  12/19/2020 0948  ?    ?Component Value Date/Time  ? CALCIUM 8.9 12/19/2020 0948  ? ALKPHOS 45 12/19/2020 0948  ? AST 19 12/19/2020 0948  ? ALT 14 12/19/2020 0948  ? BILITOT 0.7 12/19/2020 0948  ?  ? ? ?Lab Results  ?Component Value Date  ? TSH 2.49 12/19/2020  ? ?Lab Results  ?Component Value Date  ? WBC 5.5 12/19/2020  ? HGB 14.9 12/19/2020  ? HCT 42.7 12/19/2020  ? MCV 91.6 12/19/2020  ? PLT 214.0 12/19/2020  ? ?Lab Results  ?Component Value Date  ? TSH 2.49 12/19/2020  ? ? ? ? ?Total time spent on today's visit was 55 minutes, including both face-to-face time and nonface-to-face time.  Time included that spent on review of records (prior notes available to me/labs/imaging if pertinent), discussing treatment and goals, answering patient's questions and coordinating care. ? ?Cc:  Binnie Rail, MD ? ?

## 2021-12-05 ENCOUNTER — Other Ambulatory Visit: Payer: Self-pay

## 2021-12-05 ENCOUNTER — Encounter: Payer: Self-pay | Admitting: Neurology

## 2021-12-05 ENCOUNTER — Ambulatory Visit: Payer: Medicare Other | Admitting: Neurology

## 2021-12-05 ENCOUNTER — Other Ambulatory Visit (INDEPENDENT_AMBULATORY_CARE_PROVIDER_SITE_OTHER): Payer: Medicare Other

## 2021-12-05 VITALS — BP 122/76 | HR 72 | Ht 64.0 in | Wt 134.2 lb

## 2021-12-05 DIAGNOSIS — H02402 Unspecified ptosis of left eyelid: Secondary | ICD-10-CM

## 2021-12-05 DIAGNOSIS — R29898 Other symptoms and signs involving the musculoskeletal system: Secondary | ICD-10-CM | POA: Diagnosis not present

## 2021-12-05 NOTE — Patient Instructions (Signed)
Your provider has requested that you have labwork completed today. The lab is located on the Second floor at [place], within the [facility] office. When you get off the elevator, turn right and go in the [facility] [address]; the first brown door on the left.  Tell the ladies behind the desk that you are there for lab work. If you are not called within 15 minutes please check with the front desk.  ? ?Once you complete your labs you are free to go. You will receive a call or message via MyChart with your lab results.    ? ?Please let us know as soon as possible if you decide on doing the EMG  ?

## 2021-12-06 ENCOUNTER — Ambulatory Visit (INDEPENDENT_AMBULATORY_CARE_PROVIDER_SITE_OTHER): Payer: Medicare Other

## 2021-12-06 DIAGNOSIS — Z0000 Encounter for general adult medical examination without abnormal findings: Secondary | ICD-10-CM

## 2021-12-06 DIAGNOSIS — Z780 Asymptomatic menopausal state: Secondary | ICD-10-CM

## 2021-12-06 DIAGNOSIS — Z1231 Encounter for screening mammogram for malignant neoplasm of breast: Secondary | ICD-10-CM | POA: Diagnosis not present

## 2021-12-06 NOTE — Progress Notes (Signed)
Subjective:   Brittany Myers is a 71 y.o. female who presents for Medicare Annual (Subsequent) preventive examination.   I connected with Hansini Clodfelter today by telephone and verified that I am speaking with the correct person using two identifiers. Location patient: home Location provider: work Persons participating in the virtual visit: patient, provider.   I discussed the limitations, risks, security and privacy concerns of performing an evaluation and management service by telephone and the availability of in person appointments. I also discussed with the patient that there may be a patient responsible charge related to this service. The patient expressed understanding and verbally consented to this telephonic visit.    Interactive audio and video telecommunications were attempted between this provider and patient, however failed, due to patient having technical difficulties OR patient did not have access to video capability.  We continued and completed visit with audio only.    Review of Systems     Cardiac Risk Factors include: advanced age (>68mn, >>22women)     Objective:    Today's Vitals   There is no height or weight on file to calculate BMI.  Advanced Directives 12/06/2021 12/05/2021 11/27/2020 06/21/2019 03/05/2018  Does Patient Have a Medical Advance Directive? Yes Yes Yes No No  Type of AParamedicof [place]akesideLiving will Living will [place]Living will - -  Does patient want to make changes to medical advance directive? - - No - Patient declined - Yes (ED - Information included in AVS)  Copy of [place] Chart? No - copy requested - No - copy requested - -  Would patient like information on creating a medical advance directive? - - - Yes (ED - Information included in AVS) -    Current Medications (verified) Outpatient Encounter Medications as of 12/06/2021  Medication Sig   acetaminophen (TYLENOL) 500 MG  tablet Take 500 mg by mouth every 6 (six) hours as needed.   alendronate (FOSAMAX) 70 MG tablet Take 1 tablet (70 mg total) by mouth once a week. Take with a full glass of water on an empty stomach.   clobetasol cream (TEMOVATE) 08.18% Apply 1 application topically as needed.   EPINEPHrine 0.3 mg/0.3 mL IJ SOAJ injection Inject 0.3 mLs (0.3 mg total) into the muscle as needed.   estradiol (ESTRACE) 0.1 MG/GM vaginal cream Place 1 Applicatorful vaginally 2 (two) times a week.    LORazepam (ATIVAN) 0.5 MG tablet take 1 tablet by mouth every 8 hours if needed for anxiety   Menaquinone-7 (VITAMIN K2) 100 MCG CAPS Take 1 tablet by mouth daily.   Multiple Minerals-Vitamins (CALCIUM CITRATE +) TABS Taking daily   Multiple Vitamin (MULTIVITAMIN) capsule Take 1 capsule by mouth daily.   No facility-administered encounter medications on file as of 12/06/2021.    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   Endometriosis    Increased homocysteine    high-normal   Osteopenia    T score ; -2.1  @ L femoral neck in 2006   Plantar fasciitis    Past Surgical History:  Procedure Laterality Date   COLONOSCOPY  2013   Dr PSharlett Iles  HEMORRHOID SURGERY     LAPAROSCOPIC ENDOMETRIOSIS FULGURATION     5 surgeries; Dr DIrven [place] GConcha [place]  POLYPECTOMY  2001    Dr.Sam [place]  Family History  Problem Relation Age of Onset   Endometriosis Mother    Hyperlipidemia Mother    Osteopenia Mother    Aortic dissection Mother    Heart failure Father        heart attack @ 38 after CVA   Stroke Father 65       hemorrhagic  after MI   Diabetes Father    Tremor Father    Hyperlipidemia Brother    Alzheimer's disease Maternal Grandmother    Heart attack Maternal Grandfather 18   Colon cancer Paternal Grandmother    Liver cancer Maternal Aunt    Social  History   Socioeconomic History   Marital status: Married    Spouse name: Not on file   Number of children: 0   Years of education: Not on file   Highest education level: Bachelor's degree (e.g., BA, AB, BS)  Occupational History   Occupation: Human resources officer  Tobacco Use   Smoking status: Never   Smokeless tobacco: Never  Vaping Use   Vaping Use: Never used  Substance and Sexual Activity   Alcohol use: Yes    Comment:   3/ week   Drug use: No   Sexual activity: Yes  Other Topics Concern   Not on file  Social History Narrative   Heart healthy diet   Regular Exercise-yes (gym 3 times a week)   Lives with husband    Social Determinants of Health   Financial Resource Strain: Low Risk    Difficulty of Paying Living Expenses: Not hard at all  Food Insecurity: No Food Insecurity   Worried About Charity fundraiser in the Last Year: Never true   Arboriculturist in the Last Year: Never true  Transportation Needs: No Transportation Needs   Lack of Transportation (Medical): No   Lack of Transportation (Non-Medical): No  Physical Activity: Sufficiently Active   Days of Exercise per Week: 3 days   Minutes of Exercise per Session: 60 min  Stress: No Stress Concern Present   Feeling of Stress : Not at all  Social Connections: Moderately Integrated   Frequency of Communication with Friends and Family: Three times a week   Frequency of Social Gatherings with Friends and Family: Three times a week   Attends Religious Services: 1 to 4 times per year   Active Member of Clubs or Organizations: No   Attends Archivist Meetings: Never   Marital Status: Married    Tobacco Counseling Counseling given: Not Answered   Clinical Intake:  Pre-visit preparation completed: Yes  Pain : No/denies pain     Nutritional Risks: None Diabetes: No  How often do you need to have someone help you when you read instructions, pamphlets, or other written materials  from your doctor or pharmacy?: 1 - Never What is the last grade level you completed in school?: college  Diabetic?no  Interpreter Needed?: No  Information entered by :: [place] of Daily Living In your present state of health, do you have any difficulty performing the following activities: 12/06/2021  Hearing? N  Vision? N  Difficulty concentrating or making decisions? N  Walking or climbing stairs? N  Dressing or bathing? N  Doing errands, shopping? N  Preparing Food and eating ? N  Using the Toilet? N  In the past six months, have you accidently leaked urine? N  Do you have problems with loss of bowel control? N  Managing your Medications? N  Managing your Finances? N  Housekeeping or managing your Housekeeping? N  Some recent data might be hidden    Patient Care Team: Binnie Rail, MD as PCP - General (Internal Medicine) Charlton Haws, [facility] as Pharmacist (Pharmacist) Marica Otter, [place] as Consulting Physician (Optometry) Szabat, Darnelle Maffucci, [facility] as Pharmacist (Pharmacist)  Indicate any recent Medical Services you may have received from other than [facility] providers in the past year (date may be approximate).     Assessment:   This is a routine wellness examination for Annelies.  Hearing/Vision screen Vision Screening - Comments:: Annual eye exams wears contacts   Dietary issues and exercise activities discussed: Current Exercise Habits: Home exercise routine, Type of exercise: walking, Time (Minutes): 60, Frequency (Times/Week): 3, Weekly Exercise (Minutes/Week): 180, Intensity: Mild, Exercise limited by: None identified   Goals Addressed             This Visit's Progress    Patient Stated   On track    Increase my physical activity by doing more yoga and get back to church as soon as possible.       Depression Screen PHQ 2/9 Scores 12/06/2021 12/06/2021 11/27/2020 09/04/2020 06/21/2019 03/05/2018 07/28/2016  PHQ - 2 Score 0 0 3 0 1 1 0  PHQ- 9  Score - - - - 4 - -    Fall Risk Fall Risk  12/06/2021 12/05/2021 11/27/2020 09/04/2020 06/21/2019  Falls in the past year? 0 0 0 0 1  Comment - - - - -  Number falls in past yr: 0 0 0 0 1  Comment - - - - -  Injury with Fall? 0 0 0 0 1  Comment - - - - -  Risk for fall due to : No Fall Risks - No Fall Risks No Fall Risks -  Follow up Falls evaluation completed;Education provided - - Falls evaluation completed -    FALL RISK PREVENTION PERTAINING TO THE HOME:  Any stairs in or around the home? Yes  If so, are there any without handrails? No  Home free of loose throw rugs in walkways, pet beds, electrical cords, etc? Yes  Adequate lighting in your home to reduce risk of falls? Yes   ASSISTIVE DEVICES UTILIZED TO PREVENT FALLS:  Life alert? No  Use of a cane, walker or w/c? No  Grab bars in the bathroom? No  Shower chair or bench in shower? Yes  Elevated toilet seat or a handicapped toilet? No   Cognitive Function:    Normal cognitive status assessed by direct observation by this Nurse Health Advisor. No abnormalities found.      Immunizations Immunization History  Administered Date(s) Administered   Fluad Quad(high Dose 65+) 06/21/2019, 07/19/2020, 07/10/2021   Influenza Split 07/31/2011, 07/14/2012   Influenza Whole 07/18/2008, 10/10/2009, 06/18/2010   Influenza, High Dose Seasonal PF 07/28/2016, 07/17/2017, 06/24/2018   Influenza,inj,Quad PF,6+ Mos 07/20/2013, 07/20/2014, 07/24/2015   Moderna SARS-COV2 Booster Vaccination 10/06/2020   Moderna Sars-Covid-2 Vaccination 11/11/2019, 12/07/2019   Pneumococcal Conjugate-13 12/05/2016   Pneumococcal Polysaccharide-23 03/05/2018   Tdap 11/25/2006, 01/16/2017   Zoster, Live 03/26/2016    TDAP status: Up to date  Flu Vaccine status: Up to date  Pneumococcal vaccine status: Up to date  Covid-19 vaccine status: Completed vaccines  Qualifies for Shingles Vaccine? Yes   Zostavax completed No   Shingrix Completed?: No.     Education has been provided regarding the importance of this vaccine. Patient has been advised to call insurance company  to determine out of pocket expense if they have not yet received this vaccine. Advised may also receive vaccine at local pharmacy or Health Dept. Verbalized acceptance and understanding.  Screening Tests Health Maintenance  Topic Date Due   Zoster Vaccines- Shingrix (1 of 2) Never done   COVID-19 Vaccine (3 - Booster for Moderna series) 12/01/2020   MAMMOGRAM  04/19/2021   DEXA SCAN  10/16/2021   COLONOSCOPY (Pts 45-26yr Insurance coverage will need to be confirmed)  03/24/2022   TETANUS/TDAP  01/17/2027   Pneumonia Vaccine 71 Years old  Completed   INFLUENZA VACCINE  Completed   Hepatitis C Screening  Completed   HPV VACCINES  Aged Out    Health Maintenance  Health Maintenance Due  Topic Date Due   Zoster Vaccines- Shingrix (1 of 2) Never done   COVID-19 Vaccine (3 - Booster for Moderna series) 12/01/2020   MAMMOGRAM  04/19/2021   DEXA SCAN  10/16/2021    Colorectal cancer screening: Type of screening: Colonoscopy. Completed 03/21/2012. Repeat every 10 years  Mammogram status: Ordered 12/06/2021. Pt provided with contact info and advised to call to schedule appt.   Bone Density status: Ordered 12/06/2021. Pt provided with contact info and advised to call to schedule appt.  Lung Cancer Screening: (Low Dose CT Chest recommended if Age 71-80years, 30 pack-year currently smoking OR have quit w/in 15years.) does not qualify.   Lung Cancer Screening Referral: n/a  Additional Screening:  Hepatitis C Screening: does not qualify; Completed 07/24/2015  Vision Screening: Recommended annual ophthalmology exams for early detection of glaucoma and other disorders of the eye. Is the patient up to date with their annual eye exam?  Yes  Who is the provider or what is the name of the office in which the patient attends annual eye exams? Dr.Miller  If pt is not  established with a provider, would they like to be referred to a provider to establish care? No .   Dental Screening: Recommended annual dental exams for proper oral hygiene  Community Resource Referral / Chronic Care Management: CRR required this visit?  No   CCM required this visit?  No      Plan:     I have personally reviewed and noted the following in the patients chart:   Medical and social history Use of alcohol, tobacco or illicit drugs  Current medications and supplements including opioid prescriptions.  Functional ability and status Nutritional status Physical activity Advanced directives List of other physicians Hospitalizations, surgeries, and ER visits in previous 12 months Vitals Screenings to include cognitive, depression, and falls Referrals and appointments  In addition, I have reviewed and discussed with patient certain preventive protocols, quality metrics, and best practice recommendations. A written personalized care plan for preventive services as well as general preventive health recommendations were provided to patient.     LRandel Pigg LPN   30/16/0109  Nurse Notes: none

## 2021-12-06 NOTE — Patient Instructions (Signed)
Brittany Myers , Thank you for taking time to come for your Medicare Wellness Visit. I appreciate your ongoing commitment to your health goals. Please review the following plan we discussed and let me know if I can assist you in the future.   Screening recommendations/referrals: Colonoscopy: 03/21/2012 Recommended yearly ophthalmology/optometry visit for glaucoma screening and checkup Recommended yearly dental visit for hygiene and checkup  Vaccinations: Influenza vaccine: completed  Pneumococcal vaccine: completed  Tdap vaccine: 01/16/2017 Shingles vaccine: will consider     Advanced directives: none   Conditions/risks identified: none   Next appointment: none   Preventive Care 71 Years and Older, Female Preventive care refers to lifestyle choices and visits with your health care provider that can promote health and wellness. What does preventive care include? A yearly physical exam. This is also called an annual well check. Dental exams once or twice a year. Routine eye exams. Ask your health care provider how often you should have your eyes checked. Personal lifestyle choices, including: Daily care of your teeth and gums. Regular physical activity. Eating a healthy diet. Avoiding tobacco and drug use. Limiting alcohol use. Practicing safe sex. Taking low doses of aspirin every day. Taking vitamin and mineral supplements as recommended by your health care provider. What happens during an annual well check? The services and screenings done by your health care provider during your annual well check will depend on your age, overall health, lifestyle risk factors, and family history of disease. Counseling  Your health care provider may ask you questions about your: Alcohol use. Tobacco use. Drug use. Emotional well-being. Home and relationship well-being. Sexual activity. Eating habits. History of falls. Memory and ability to understand (cognition). Work and work  Statistician. Screening  You may have the following tests or measurements: Height, weight, and BMI. Blood pressure. Lipid and cholesterol levels. These may be checked every 5 years, or more frequently if you are over 83 years old. Skin check. Lung cancer screening. You may have this screening every year starting at age 96 if you have a 30-pack-year history of smoking and currently smoke or have quit within the past 15 years. Fecal occult blood test (FOBT) of the stool. You may have this test every year starting at age 2. Flexible sigmoidoscopy or colonoscopy. You may have a sigmoidoscopy every 5 years or a colonoscopy every 10 years starting at age 75. Prostate cancer screening. Recommendations will vary depending on your family history and other risks. Hepatitis C blood test. Hepatitis B blood test. Sexually transmitted disease (STD) testing. Diabetes screening. This is done by checking your blood sugar (glucose) after you have not eaten for a while (fasting). You may have this done every 1-3 years. Abdominal aortic aneurysm (AAA) screening. You may need this if you are a current or former smoker. Osteoporosis. You may be screened starting at age 34 if you are at high risk. Talk with your health care provider about your test results, treatment options, and if necessary, the need for more tests. Vaccines  Your health care provider may recommend certain vaccines, such as: Influenza vaccine. This is recommended every year. Tetanus, diphtheria, and acellular pertussis (Tdap, Td) vaccine. You may need a Td booster every 10 years. Zoster vaccine. You may need this after age 104. Pneumococcal 13-valent conjugate (PCV13) vaccine. One dose is recommended after age 71. Pneumococcal polysaccharide (PPSV23) vaccine. One dose is recommended after age 67. Talk to your health care provider about which screenings and vaccines you need and how often you  need them. This information is not intended to replace  advice given to you by your health care provider. Make sure you discuss any questions you have with your health care provider. Document Released: 10/12/2015 Document Revised: 06/04/2016 Document Reviewed: 07/17/2015 Elsevier Interactive Patient Education  2017 [place] Prevention in the Home Falls can cause injuries. They can happen to people of all ages. There are many things you can do to make your home safe and to help prevent falls. What can I do on the outside of my home? Regularly fix the edges of walkways and driveways and fix any cracks. Remove anything that might make you trip as you walk through a door, such as a raised step or threshold. Trim any bushes or trees on the path to your home. Use bright outdoor lighting. Clear any walking paths of anything that might make someone trip, such as rocks or tools. Regularly check to see if handrails are loose or broken. Make sure that both sides of any steps have handrails. Any raised decks and porches should have guardrails on the edges. Have any leaves, snow, or ice cleared regularly. Use sand or salt on walking paths during winter. Clean up any spills in your garage right away. This includes oil or grease spills. What can I do in the bathroom? Use night lights. Install grab bars by the toilet and in the tub and shower. Do not use towel bars as grab bars. Use non-skid mats or decals in the tub or shower. If you need to sit down in the shower, use a plastic, non-slip stool. Keep the floor dry. Clean up any water that spills on the floor as soon as it happens. Remove soap buildup in the tub or shower regularly. Attach bath mats securely with double-sided non-slip rug tape. Do not have throw rugs and other things on the floor that can make you trip. What can I do in the bedroom? Use night lights. Make sure that you have a light by your bed that is easy to reach. Do not use any sheets or blankets that are too big for your bed.  They should not hang down onto the floor. Have a firm chair that has side arms. You can use this for support while you get dressed. Do not have throw rugs and other things on the floor that can make you trip. What can I do in the kitchen? Clean up any spills right away. Avoid walking on wet floors. Keep items that you use a lot in easy-to-reach places. If you need to reach something above you, use a strong step stool that has a grab bar. Keep electrical cords out of the way. Do not use floor polish or wax that makes floors slippery. If you must use wax, use non-skid floor wax. Do not have throw rugs and other things on the floor that can make you trip. What can I do with my stairs? Do not leave any items on the stairs. Make sure that there are handrails on both sides of the stairs and use them. Fix handrails that are broken or loose. Make sure that handrails are as long as the stairways. Check any carpeting to make sure that it is firmly attached to the stairs. Fix any carpet that is loose or worn. Avoid having throw rugs at the top or bottom of the stairs. If you do have throw rugs, attach them to the floor with carpet tape. Make sure that you have a light switch  at the top of the stairs and the bottom of the stairs. If you do not have them, ask someone to add them for you. What else can I do to help prevent falls? Wear shoes that: Do not have high heels. Have rubber bottoms. Are comfortable and fit you well. Are closed at the toe. Do not wear sandals. If you use a stepladder: Make sure that it is fully opened. Do not climb a closed stepladder. Make sure that both sides of the stepladder are locked into place. Ask someone to hold it for you, if possible. Clearly mark and make sure that you can see: Any grab bars or handrails. First and last steps. Where the edge of each step is. Use tools that help you move around (mobility aids) if they are needed. These  include: Canes. Walkers. Scooters. Crutches. Turn on the lights when you go into a dark area. Replace any light bulbs as soon as they burn out. Set up your furniture so you have a clear path. Avoid moving your furniture around. If any of your floors are uneven, fix them. If there are any pets around you, be aware of where they are. Review your medicines with your doctor. Some medicines can make you feel dizzy. This can increase your chance of falling. Ask your doctor what other things that you can do to help prevent falls. This information is not intended to replace advice given to you by your health care provider. Make sure you discuss any questions you have with your health care provider. Document Released: 07/12/2009 Document Revised: 02/21/2016 Document Reviewed: 10/20/2014 Elsevier Interactive Patient Education  2017 [employer].

## 2021-12-09 ENCOUNTER — Encounter: Payer: Self-pay | Admitting: Family Medicine

## 2021-12-19 ENCOUNTER — Ambulatory Visit: Payer: Medicare Other | Admitting: Internal Medicine

## 2021-12-22 NOTE — Progress Notes (Signed)
? ? ? ? ?Subjective:  ? ? Patient ID: Brittany Myers, female    DOB: 1951/09/25, 71 y.o.   MRN: [id number] ? ?This visit occurred during the SARS-CoV-2 public health emergency.  Safety protocols were in place, including screening questions prior to the visit, additional usage of staff PPE, and extensive cleaning of exam room while observing appropriate contact time as indicated for disinfecting solutions.   ? ? ?HPI ?Brittany Myers is here for follow up of her chronic medical problems, including  ? ?Had tremor - Dr Tat noted weakness  - advised EMG - Jenny Reichmann wanted to think about that first.  Dr Tat noted slight facial asymmetry with mild left ptosis and decreased blink.  She will schedule her EMG. ? ?She is feeling increased anxiety and because of that not sleeping.  She does okay during the day because she is busy doing things, but she is tired from not sleeping.  When she lays down at night she has a hard time shutting off the anxiety.  She takes lorazepam 2-3 nights a week and that does help, but she does not want to take it on a regular basis and does feel slightly groggy in the morning.  Her sleep is intermittent and not good quality.  She is concerned that this is affecting her health and she is not sure what to do.  Most of her anxiety is related to family issues-with her brother. ? ?Medications and allergies reviewed with patient and updated if appropriate. ? ?Current Outpatient Medications on File Prior to Visit  ?Medication Sig Dispense Refill  ? acetaminophen (TYLENOL) 500 MG tablet Take 500 mg by mouth every 6 (six) hours as needed.    ? alendronate (FOSAMAX) 70 MG tablet Take 1 tablet (70 mg total) by mouth once a week. Take with a full glass of water on an empty stomach. 12 tablet 3  ? clobetasol cream (TEMOVATE) 4.25 % Apply 1 application topically as needed.    ? EPINEPHrine 0.3 mg/0.3 mL IJ SOAJ injection Inject 0.3 mLs (0.3 mg total) into the muscle as needed. 2 each 0  ? estradiol (ESTRACE) 0.1 MG/GM  vaginal cream Place 1 Applicatorful vaginally 2 (two) times a week.     ? LORazepam (ATIVAN) 0.5 MG tablet take 1 tablet by mouth every 8 hours if needed for anxiety 30 tablet 2  ? Menaquinone-7 (VITAMIN K2) 100 MCG CAPS Take 1 tablet by mouth daily.    ? Multiple Minerals-Vitamins (CALCIUM CITRATE +) TABS Taking daily  0  ? Multiple Vitamin (MULTIVITAMIN) capsule Take 1 capsule by mouth daily.    ? ?No current facility-administered medications on file prior to visit.  ? ? ? ?Review of Systems ? ?   ?Objective:  ? ?Vitals:  ? 12/23/21 1528  ?BP: 116/78  ?Pulse: 80  ?Temp: 98.4 ?F (36.9 ?C)  ?SpO2: 97%  ? ?BP Readings from Last 3 Encounters:  ?12/23/21 116/78  ?12/05/21 122/76  ?12/02/21 110/60  ? ?Wt Readings from Last 3 Encounters:  ?12/23/21 131 lb (59.4 kg)  ?12/05/21 134 lb 3.2 oz (60.9 kg)  ?12/02/21 130 lb (59 kg)  ? ?Body mass index is 22.49 kg/m?. ? ?  ?Physical Exam ?Constitutional:   ?   General: She is not in acute distress. ?   Appearance: Normal appearance.  ?HENT:  ?   Head: Normocephalic.  ?Skin: ?   General: Skin is warm and dry.  ?   Findings: No rash.  ?Neurological:  ?  Mental Status: She is alert. Mental status is at baseline.  ?Psychiatric:  ?   Comments: Anxious  ? ?   ? ?Lab Results  ?Component Value Date  ? WBC 5.5 12/19/2020  ? HGB 14.9 12/19/2020  ? HCT 42.7 12/19/2020  ? PLT 214.0 12/19/2020  ? GLUCOSE 97 12/19/2020  ? CHOL 187 12/19/2020  ? TRIG 74.0 12/19/2020  ? HDL 64.40 12/19/2020  ? LDLDIRECT 116.2 05/20/2012  ? LDLCALC 108 (H) 12/19/2020  ? ALT 14 12/19/2020  ? AST 19 12/19/2020  ? NA 142 12/19/2020  ? K 4.2 12/19/2020  ? CL 107 12/19/2020  ? CREATININE 0.79 12/19/2020  ? BUN 17 12/19/2020  ? CO2 28 12/19/2020  ? TSH 2.49 12/19/2020  ? HGBA1C 5.5 05/21/2018  ? ? ? ?Assessment & Plan:  ? ? ?See Problem List for Assessment and Plan of chronic medical problems.  ? ? ?

## 2021-12-23 ENCOUNTER — Encounter: Payer: Self-pay | Admitting: Internal Medicine

## 2021-12-23 ENCOUNTER — Ambulatory Visit (INDEPENDENT_AMBULATORY_CARE_PROVIDER_SITE_OTHER): Payer: Medicare Other | Admitting: Internal Medicine

## 2021-12-23 ENCOUNTER — Other Ambulatory Visit: Payer: Self-pay

## 2021-12-23 VITALS — BP 116/78 | HR 80 | Temp 98.4°F | Ht 64.0 in | Wt 131.0 lb

## 2021-12-23 DIAGNOSIS — M810 Age-related osteoporosis without current pathological fracture: Secondary | ICD-10-CM

## 2021-12-23 DIAGNOSIS — G479 Sleep disorder, unspecified: Secondary | ICD-10-CM | POA: Diagnosis not present

## 2021-12-23 DIAGNOSIS — F419 Anxiety disorder, unspecified: Secondary | ICD-10-CM

## 2021-12-23 DIAGNOSIS — R5383 Other fatigue: Secondary | ICD-10-CM | POA: Diagnosis not present

## 2021-12-23 DIAGNOSIS — R5382 Chronic fatigue, unspecified: Secondary | ICD-10-CM

## 2021-12-23 LAB — CBC WITH DIFFERENTIAL/PLATELET
Basophils Absolute: 0.1 10*3/uL (ref 0.0–0.1)
Basophils Relative: 0.9 % (ref 0.0–3.0)
Eosinophils Absolute: 0.3 10*3/uL (ref 0.0–0.7)
Eosinophils Relative: 3.9 % (ref 0.0–5.0)
HCT: 43.8 % (ref 36.0–46.0)
Hemoglobin: 14.9 g/dL (ref 12.0–15.0)
Lymphocytes Relative: 30 % (ref 12.0–46.0)
Lymphs Abs: 2 10*3/uL (ref 0.7–4.0)
MCHC: 34 g/dL (ref 30.0–36.0)
MCV: 91.7 fl (ref 78.0–100.0)
Monocytes Absolute: 0.6 10*3/uL (ref 0.1–1.0)
Monocytes Relative: 9.5 % (ref 3.0–12.0)
Neutro Abs: 3.7 10*3/uL (ref 1.4–7.7)
Neutrophils Relative %: 55.7 % (ref 43.0–77.0)
Platelets: 230 10*3/uL (ref 150.0–400.0)
RBC: 4.77 Mil/uL (ref 3.87–5.11)
RDW: 12.6 % (ref 11.5–15.5)
WBC: 6.6 10*3/uL (ref 4.0–10.5)

## 2021-12-23 LAB — COMPREHENSIVE METABOLIC PANEL
ALT: 17 U/L (ref 0–35)
AST: 19 U/L (ref 0–37)
Albumin: 4.5 g/dL (ref 3.5–5.2)
Alkaline Phosphatase: 51 U/L (ref 39–117)
BUN: 20 mg/dL (ref 6–23)
CO2: 29 mEq/L (ref 19–32)
Calcium: 9.5 mg/dL (ref 8.4–10.5)
Chloride: 105 mEq/L (ref 96–112)
Creatinine, Ser: 0.86 mg/dL (ref 0.40–1.20)
GFR: 68.3 mL/min (ref >60.00)
Glucose, Bld: 102 mg/dL — ABNORMAL HIGH (ref 70–99)
Potassium: 4.3 mEq/L (ref 3.5–5.1)
Sodium: 139 mEq/L (ref 135–145)
Total Bilirubin: 0.4 mg/dL (ref 0.2–1.2)
Total Protein: 6.8 g/dL (ref 6.0–8.3)

## 2021-12-23 MED ORDER — CITALOPRAM HYDROBROMIDE 10 MG PO TABS: 10.0000 mg | ORAL_TABLET | Freq: Every day | ORAL | 5 refills | Status: DC

## 2021-12-23 NOTE — Assessment & Plan Note (Signed)
Chronic ?Has some generalized anxiety now-related to family issues with her brother ?This is causing insomnia/poor quality sleep ?Discussed treatment options ?She can continue to take the lorazepam 0.5 mg at night as needed, but we both agree taking this regularly would not be a good idea ?We will start Celexa 10 mg daily ?

## 2021-12-23 NOTE — Assessment & Plan Note (Signed)
Neck ?DEXA due ?Ordered for [facility] ?Continue regular exercise ?Continue multivitamin ?

## 2021-12-23 NOTE — Assessment & Plan Note (Signed)
Chronic ?Sleep is worse recently related to anxiety, but she has always had issues ?We will treat anxiety and hopefully that will help with some of her sleep issues ?Okay to continue lorazepam 0.5 mg nightly as needed-she does try to keep this to a minimum ?

## 2021-12-23 NOTE — Assessment & Plan Note (Signed)
Acute ?She is having increased fatigue ?Likely related to not sleeping well secondary to increased anxiety ?She does want to make sure there is not anything else going on ?Check CBC, CMP and TSH ?Hopefully treating anxiety will improve her sleep and her energy level ?

## 2021-12-23 NOTE — Patient Instructions (Addendum)
? ? ? ?  Blood work was ordered.   ? ? ?Medications changes include :   celexa 10 mg daily ? ? ?Your prescription(s) have been sent to your pharmacy.  ? ? ?A bone density was ordered for [facility].      ? ? ?

## 2021-12-24 LAB — TSH: TSH: 1.89 u[IU]/mL (ref 0.35–5.50)

## 2021-12-26 LAB — MYASTHENIA GRAVIS PANEL 1
A CHR BINDING ABS: <0.3 nmol/L
STRIATED MUSCLE AB SCREEN: NEGATIVE

## 2022-01-10 DIAGNOSIS — M8589 Other specified disorders of bone density and structure, multiple sites: Secondary | ICD-10-CM | POA: Diagnosis not present

## 2022-01-10 DIAGNOSIS — M810 Age-related osteoporosis without current pathological fracture: Secondary | ICD-10-CM | POA: Diagnosis not present

## 2022-01-10 DIAGNOSIS — Z780 Asymptomatic menopausal state: Secondary | ICD-10-CM | POA: Diagnosis not present

## 2022-01-10 LAB — HM DEXA SCAN

## 2022-01-16 ENCOUNTER — Other Ambulatory Visit: Payer: Self-pay | Admitting: Internal Medicine

## 2022-01-21 ENCOUNTER — Telehealth: Payer: Self-pay | Admitting: Neurology

## 2022-01-21 ENCOUNTER — Other Ambulatory Visit: Payer: Self-pay

## 2022-01-21 NOTE — Telephone Encounter (Signed)
Patient called and left a message stating she'd like to proceed with getting an EMG. ? ?I returned her call and left a voice mail that I am letting Dr. Carles Collet know so an order can be entered. ?

## 2022-01-21 NOTE — Telephone Encounter (Signed)
Would you like me to schedule with Dr. Posey Pronto when she returns or try to get scheduled at [facility]? ?

## 2022-01-21 NOTE — Telephone Encounter (Addendum)
I just wanted to verify its right handed weakness you would like tested for this EMG ?

## 2022-01-22 ENCOUNTER — Other Ambulatory Visit: Payer: Self-pay

## 2022-01-22 DIAGNOSIS — R29898 Other symptoms and signs involving the musculoskeletal system: Secondary | ICD-10-CM

## 2022-01-22 NOTE — Telephone Encounter (Signed)
I've called and left the patient a voice mail to call back and schedule: EMG RUE/ Rebecca Tat. ? ?Routing to [place] for order entry. ?

## 2022-01-22 NOTE — Telephone Encounter (Signed)
Orders have been entered for RUE ?

## 2022-02-06 DIAGNOSIS — M810 Age-related osteoporosis without current pathological fracture: Secondary | ICD-10-CM | POA: Diagnosis not present

## 2022-02-26 DIAGNOSIS — D225 Melanocytic nevi of trunk: Secondary | ICD-10-CM | POA: Diagnosis not present

## 2022-02-26 DIAGNOSIS — L821 Other seborrheic keratosis: Secondary | ICD-10-CM | POA: Diagnosis not present

## 2022-02-26 DIAGNOSIS — L570 Actinic keratosis: Secondary | ICD-10-CM | POA: Diagnosis not present

## 2022-02-26 DIAGNOSIS — Z85828 Personal history of other malignant neoplasm of skin: Secondary | ICD-10-CM | POA: Diagnosis not present

## 2022-02-26 DIAGNOSIS — L218 Other seborrheic dermatitis: Secondary | ICD-10-CM | POA: Diagnosis not present

## 2022-02-26 DIAGNOSIS — D1801 Hemangioma of skin and subcutaneous tissue: Secondary | ICD-10-CM | POA: Diagnosis not present

## 2022-02-26 DIAGNOSIS — I788 Other diseases of capillaries: Secondary | ICD-10-CM | POA: Diagnosis not present

## 2022-02-26 DIAGNOSIS — L812 Freckles: Secondary | ICD-10-CM | POA: Diagnosis not present

## 2022-03-27 ENCOUNTER — Encounter: Payer: Self-pay | Admitting: Gastroenterology

## 2022-04-15 DIAGNOSIS — H2513 Age-related nuclear cataract, bilateral: Secondary | ICD-10-CM | POA: Diagnosis not present

## 2022-04-18 ENCOUNTER — Encounter: Payer: Self-pay | Admitting: Gastroenterology

## 2022-04-24 ENCOUNTER — Encounter: Payer: Self-pay | Admitting: Family Medicine

## 2022-04-30 ENCOUNTER — Encounter: Payer: Self-pay | Admitting: Family Medicine

## 2022-04-30 ENCOUNTER — Ambulatory Visit: Payer: Medicare Other | Admitting: Family Medicine

## 2022-04-30 VITALS — BP 120/80 | Ht 64.0 in | Wt 125.0 lb

## 2022-04-30 DIAGNOSIS — M23203 Derangement of unspecified medial meniscus due to old tear or injury, right knee: Secondary | ICD-10-CM | POA: Diagnosis not present

## 2022-04-30 MED ORDER — PREDNISONE 5 MG PO TABS: ORAL_TABLET | ORAL | 0 refills | Status: DC

## 2022-04-30 NOTE — Assessment & Plan Note (Signed)
Acutely occurring.  Having pain at the lateral aspect of the knee but also endorses pain down to the foot and up to the right lateral hip.  May have component of radicular pain. -Counseled on home exercise therapy and supportive care. -Prednisone. -Could consider PRP injection.

## 2022-04-30 NOTE — Progress Notes (Signed)
  Brittany Myers - 71 y.o. female MRN [id number]  Date of birth: 19-Sep-1951  SUBJECTIVE:  Including CC & ROS.  No chief complaint on file.   Brittany Myers is a 71 y.o. female that is  presenting with acute right knee pain. Having pain on the lateral aspect of the knee. Did well since the aspiration.    Review of Systems See HPI   HISTORY: Past Medical, Surgical, Social, and Family History Reviewed & Updated per EMR.   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   Endometriosis    Increased homocysteine    high-normal   Osteopenia    T score ; -2.1  @ L femoral neck in 2006   Plantar fasciitis     Past Surgical History:  Procedure Laterality Date   COLONOSCOPY  2013   Dr Sharlett Iles   HEMORRHOID SURGERY     LAPAROSCOPIC ENDOMETRIOSIS FULGURATION     5 surgeries; Dr Irven [place], [place]   POLYPECTOMY  2001    Dr.Sam Moorhead      PHYSICAL EXAM:  VS: BP 120/80 (BP Location: Left Arm, Patient Position: Sitting)   Ht '5\' 4"'$  (1.626 m)   Wt 125 lb (56.7 kg)   BMI 21.46 kg/m  Physical Exam Gen: NAD, alert, cooperative with exam, well-appearing MSK:  Neurovascularly intact       ASSESSMENT & PLAN:   Degenerative tear of medial meniscus of right knee Acutely occurring.  Having pain at the lateral aspect of the knee but also endorses pain down to the foot and up to the right lateral hip.  May have component of radicular pain. -Counseled on home exercise therapy and supportive care. -Prednisone. -Could consider PRP injection.

## 2022-05-06 ENCOUNTER — Ambulatory Visit (AMBULATORY_SURGERY_CENTER): Payer: Self-pay

## 2022-05-06 ENCOUNTER — Ambulatory Visit: Payer: Medicare Other | Admitting: Neurology

## 2022-05-06 VITALS — Ht 64.0 in | Wt 130.0 lb

## 2022-05-06 DIAGNOSIS — R29898 Other symptoms and signs involving the musculoskeletal system: Secondary | ICD-10-CM

## 2022-05-06 DIAGNOSIS — Z1211 Encounter for screening for malignant neoplasm of colon: Secondary | ICD-10-CM

## 2022-05-06 MED ORDER — NA SULFATE-K SULFATE-MG SULF 17.5-3.13-1.6 GM/177ML PO SOLN: 1.0000 | ORAL | 0 refills | Status: DC

## 2022-05-06 NOTE — Progress Notes (Signed)
No egg or soy allergy known to patient  No issues known to pt with past sedation with any surgeries or procedures Patient denies ever being told they had issues or difficulty with intubation  No FH of Malignant Hyperthermia Pt is not on diet pills Pt is not on  home 02  Pt is not on blood thinners  Pt denies issues with constipation  No A fib or A flutter Have any cardiac testing pending--denied Pt instructed to use [email or web address] or GoodRx for a price reduction on prep   

## 2022-05-06 NOTE — Progress Notes (Signed)
Patient advised of her results. °

## 2022-05-06 NOTE — Procedures (Signed)
[facility]  [place], [place]  [place],  [address] Tel: [phone] Fax:  [phone] Test Date:  05/06/2022  Patient: Brittany Myers DOB: [birth year] Physician: Narda Amber, DO  Sex: Female Height: '5\' 4"'$  Ref Phys: Alonza Bogus, D.O.  ID#: [id number]   Technician:    Patient Complaints: This is a 71 year old female referred for evaluation of right hand weakness.  NCV & EMG Findings: Extensive electrodiagnostic testing of the right upper extremity shows:  Right median, ulnar, and mixed palmar sensory responses are within normal limits. Right median and ulnar motor responses are within normal limits. There is no evidence of active or chronic motor axonal loss changes affecting any of the tested muscles.  Motor unit configuration and recruitment pattern is within normal limits.  Impression: This is a normal study of the right upper extremity.  In particular, there is no evidence of a cervical radiculopathy or carpal tunnel syndrome.   ___________________________ Narda Amber, DO    Nerve Conduction Studies Anti Sensory Summary Table   Stim Site NR Peak (ms) Norm Peak (ms) P-T Amp (V) Norm P-T Amp  Right Median Anti Sensory (2nd Digit)  35C  Wrist    2.8 <3.8 29.2 >10  Right Ulnar Anti Sensory (5th Digit)  35C  Wrist    2.6 <3.2 29.8 >5   Motor Summary Table   Stim Site NR Onset (ms) Norm Onset (ms) O-P Amp (mV) Norm O-P Amp Site1 Site2 Delta-0 (ms) Dist (cm) Vel (m/s) Norm Vel (m/s)  Right Median Motor (Abd Poll Brev)  35C  Wrist    3.0 <4.0 11.7 >5 Elbow Wrist 4.3 26.0 60 >50  Elbow    7.3  11.2         Right Ulnar Motor (Abd Dig Minimi)  35C  Wrist    2.4 <3.1 10.4 >7 B Elbow Wrist 3.2 20.0 63 >50  B Elbow    5.6  10.0  A Elbow B Elbow 1.7 10.0 59 >50  A Elbow    7.3  9.9          Comparison Summary Table   Stim Site NR Peak (ms) Norm Peak (ms) P-T Amp (V) Site1 Site2 Delta-P (ms) Norm Delta (ms)  Right Median/Ulnar Palm Comparison  (Wrist - 8cm)  35C  Median Palm    1.7 <2.2 59.1 Median Palm Ulnar Palm 0.2   Ulnar Palm    1.5 <2.2 13.3       EMG   Side Muscle Ins Act Fibs Psw Fasc Number Recrt Dur Dur. Amp Amp. Poly Poly. Comment  Right 1stDorInt Nml Nml Nml Nml Nml Nml Nml Nml Nml Nml Nml Nml N/A  Right PronatorTeres Nml Nml Nml Nml Nml Nml Nml Nml Nml Nml Nml Nml N/A  Right Biceps Nml Nml Nml Nml Nml Nml Nml Nml Nml Nml Nml Nml N/A  Right Triceps Nml Nml Nml Nml Nml Nml Nml Nml Nml Nml Nml Nml N/A  Right Deltoid Nml Nml Nml Nml Nml Nml Nml Nml Nml Nml Nml Nml N/A      Waveforms:

## 2022-05-08 DIAGNOSIS — Z1231 Encounter for screening mammogram for malignant neoplasm of breast: Secondary | ICD-10-CM | POA: Diagnosis not present

## 2022-05-09 LAB — HM MAMMOGRAPHY

## 2022-05-22 ENCOUNTER — Encounter: Payer: Self-pay | Admitting: Internal Medicine

## 2022-05-22 NOTE — Progress Notes (Signed)
Outside notes received. Information abstracted. Notes sent to scan.  

## 2022-05-23 ENCOUNTER — Encounter: Payer: Self-pay | Admitting: Gastroenterology

## 2022-05-24 ENCOUNTER — Encounter: Payer: Self-pay | Admitting: Internal Medicine

## 2022-05-24 NOTE — Progress Notes (Unsigned)
Subjective:    Patient ID: Brittany Myers, female    DOB: 11/29/1950, 71 y.o.   MRN: [id number]      HPI Brittany Myers is here for  Chief Complaint  Patient presents with   Tachycardia         Medications and allergies reviewed with patient and updated if appropriate.  Current Outpatient Medications on File Prior to Visit  Medication Sig Dispense Refill   acetaminophen (TYLENOL) 500 MG tablet Take 500 mg by mouth every 6 (six) hours as needed.     alendronate (FOSAMAX) 70 MG tablet Take 1 tablet (70 mg total) by mouth once a week. Take with a full glass of water on an empty stomach. 12 tablet 3   clobetasol cream (TEMOVATE) 2.24 % Apply 1 application topically as needed.     EPINEPHrine 0.3 mg/0.3 mL IJ SOAJ injection Inject 0.3 mLs (0.3 mg total) into the muscle as needed. 2 each 0   estradiol (ESTRACE) 0.1 MG/GM vaginal cream Place 1 Applicatorful vaginally 2 (two) times a week.      LORazepam (ATIVAN) 0.5 MG tablet take 1 tablet by mouth every 8 hours if needed for anxiety 30 tablet 2   Menaquinone-7 (VITAMIN K2) 100 MCG CAPS Take 1 tablet by mouth daily.     Multiple Minerals-Vitamins (CALCIUM CITRATE +) TABS Taking daily  0   Multiple Vitamin (MULTIVITAMIN) capsule Take 1 capsule by mouth daily.     Na Sulfate-K Sulfate-Mg Sulf 17.5-3.13-1.6 GM/177ML SOLN Take 1 kit by mouth as directed. May use generic Suprep, no prior authorization. Take as directed. 354 mL 0   No current facility-administered medications on file prior to visit.    Review of Systems     Objective:  There were no vitals filed for this visit. BP Readings from Last 3 Encounters:  04/30/22 120/80  12/23/21 116/78  12/05/21 122/76   Wt Readings from Last 3 Encounters:  05/06/22 130 lb (59 kg)  04/30/22 125 lb (56.7 kg)  12/23/21 131 lb (59.4 kg)   There is no height or weight on file to calculate BMI.    Physical Exam         Assessment & Plan:    See Problem List for Assessment and  Plan of chronic medical problems.

## 2022-05-26 ENCOUNTER — Encounter: Payer: Self-pay | Admitting: Internal Medicine

## 2022-05-26 ENCOUNTER — Ambulatory Visit: Payer: Medicare Other | Attending: Internal Medicine

## 2022-05-26 ENCOUNTER — Ambulatory Visit (INDEPENDENT_AMBULATORY_CARE_PROVIDER_SITE_OTHER): Payer: Medicare Other | Admitting: Internal Medicine

## 2022-05-26 VITALS — BP 118/78 | HR 87 | Temp 98.4°F | Ht 64.0 in | Wt 133.0 lb

## 2022-05-26 DIAGNOSIS — I479 Paroxysmal tachycardia, unspecified: Secondary | ICD-10-CM | POA: Insufficient documentation

## 2022-05-26 DIAGNOSIS — R000 Tachycardia, unspecified: Secondary | ICD-10-CM

## 2022-05-26 DIAGNOSIS — F419 Anxiety disorder, unspecified: Secondary | ICD-10-CM | POA: Diagnosis not present

## 2022-05-26 DIAGNOSIS — G479 Sleep disorder, unspecified: Secondary | ICD-10-CM | POA: Diagnosis not present

## 2022-05-26 LAB — TSH: TSH: 2.93 u[IU]/mL (ref 0.35–5.50)

## 2022-05-26 LAB — CBC WITH DIFFERENTIAL/PLATELET
Basophils Absolute: 0 10*3/uL (ref 0.0–0.1)
Basophils Relative: 0.7 % (ref 0.0–3.0)
Eosinophils Absolute: 0.2 10*3/uL (ref 0.0–0.7)
Eosinophils Relative: 3.8 % (ref 0.0–5.0)
HCT: 43.9 % (ref 36.0–46.0)
Hemoglobin: 15.1 g/dL — ABNORMAL HIGH (ref 12.0–15.0)
Lymphocytes Relative: 26.2 % (ref 12.0–46.0)
Lymphs Abs: 1.3 10*3/uL (ref 0.7–4.0)
MCHC: 34.5 g/dL (ref 30.0–36.0)
MCV: 92.9 fl (ref 78.0–100.0)
Monocytes Absolute: 0.5 10*3/uL (ref 0.1–1.0)
Monocytes Relative: 9.7 % (ref 3.0–12.0)
Neutro Abs: 3 10*3/uL (ref 1.4–7.7)
Neutrophils Relative %: 59.6 % (ref 43.0–77.0)
Platelets: 202 10*3/uL (ref 150.0–400.0)
RBC: 4.72 Mil/uL (ref 3.87–5.11)
RDW: 12.7 % (ref 11.5–15.5)
WBC: 5.1 10*3/uL (ref 4.0–10.5)

## 2022-05-26 LAB — COMPREHENSIVE METABOLIC PANEL
ALT: 20 U/L (ref 0–35)
AST: 22 U/L (ref 0–37)
Albumin: 4.3 g/dL (ref 3.5–5.2)
Alkaline Phosphatase: 57 U/L (ref 39–117)
BUN: 18 mg/dL (ref 6–23)
CO2: 28 mEq/L (ref 19–32)
Calcium: 9.4 mg/dL (ref 8.4–10.5)
Chloride: 104 mEq/L (ref 96–112)
Creatinine, Ser: 0.79 mg/dL (ref 0.40–1.20)
GFR: 75.4 mL/min (ref >60.00)
Glucose, Bld: 75 mg/dL (ref 70–99)
Potassium: 4.3 mEq/L (ref 3.5–5.1)
Sodium: 139 mEq/L (ref 135–145)
Total Bilirubin: 0.5 mg/dL (ref 0.2–1.2)
Total Protein: 6.8 g/dL (ref 6.0–8.3)

## 2022-05-26 MED ORDER — CITALOPRAM HYDROBROMIDE 10 MG PO TABS: 10.0000 mg | ORAL_TABLET | Freq: Every day | ORAL | 2 refills | Status: DC

## 2022-05-26 MED ORDER — LORAZEPAM 0.5 MG PO TABS: 0.5000 mg | ORAL_TABLET | Freq: Every evening | ORAL | 2 refills | Status: DC | PRN

## 2022-05-26 NOTE — Assessment & Plan Note (Signed)
Acute on chronic Has always had intermittent sleep issues and lorazepam as needed has always been very effective Sleep is worse recently likely related to increased stress Continue lorazepam 0.5 mg at nighttime as needed only Will work on better controlling her anxiety

## 2022-05-26 NOTE — Patient Instructions (Addendum)
     Blood work was ordered.     Medications changes include :   restart celexa 10 mg daily   Your prescription(s) have been sent to your pharmacy.    A referral was ordered for cardiology.     Someone from that office will call you to schedule an appointment.

## 2022-05-26 NOTE — Assessment & Plan Note (Addendum)
Chronic Increased anxiety over the past few months and this is causing her to grind her teeth and not sleep well and may be affecting her heart rate Restart celexa 10 mg daily-she did take this for short duration, but did not see much difference and stopped it-restart.  Discussed she may need a higher dose.   Continue lorazepam 0.5 mg at bedtime as needed only

## 2022-05-26 NOTE — Addendum Note (Signed)
Addended by: Binnie Rail on: 05/26/2022 05:51 PM   Modules accepted: Orders

## 2022-05-26 NOTE — Assessment & Plan Note (Signed)
New Having intermittent tachycardia that is not explained by her current activity when she has not Did use kardia on occasion there was no abnormal rhythm Apple Watch is what has detected the abnormal heart rate EKG today-NSR at 71 bpm, ST and T wave abnormality-unchanged from last EKG from 2015 ?  Related to stress-Ensure that stress is influencing some of it, but need to rule out other causes as well Referral to cardiology ordered Holter monitor for 7 days ordered Check CBC, CMP and TSH

## 2022-05-26 NOTE — Progress Notes (Unsigned)
Enrolled for [employer] to mail a ZIO XT long term holter monitor to the patients address on file.   DOD to read. 

## 2022-05-27 DIAGNOSIS — R000 Tachycardia, unspecified: Secondary | ICD-10-CM | POA: Diagnosis not present

## 2022-05-28 DIAGNOSIS — L900 Lichen sclerosus et atrophicus: Secondary | ICD-10-CM | POA: Diagnosis not present

## 2022-05-28 DIAGNOSIS — M810 Age-related osteoporosis without current pathological fracture: Secondary | ICD-10-CM | POA: Diagnosis not present

## 2022-05-28 DIAGNOSIS — Z6822 Body mass index (BMI) 22.0-22.9, adult: Secondary | ICD-10-CM | POA: Diagnosis not present

## 2022-06-02 ENCOUNTER — Encounter: Payer: Self-pay | Admitting: Cardiology

## 2022-06-02 ENCOUNTER — Encounter: Payer: Self-pay | Admitting: Internal Medicine

## 2022-06-03 NOTE — Telephone Encounter (Signed)
Called patient    left message  that appointment was changed to Jul 01, 2022 at 9:40 am

## 2022-06-04 ENCOUNTER — Ambulatory Visit: Payer: Medicare Other | Admitting: Cardiology

## 2022-06-04 ENCOUNTER — Encounter: Payer: Self-pay | Admitting: Cardiology

## 2022-06-04 ENCOUNTER — Ambulatory Visit: Payer: Medicare Other | Attending: Cardiology | Admitting: Cardiology

## 2022-06-04 VITALS — BP 124/68 | HR 85 | Ht 64.0 in | Wt 132.0 lb

## 2022-06-04 DIAGNOSIS — R000 Tachycardia, unspecified: Secondary | ICD-10-CM | POA: Diagnosis not present

## 2022-06-04 DIAGNOSIS — R9431 Abnormal electrocardiogram [ECG] [EKG]: Secondary | ICD-10-CM | POA: Diagnosis not present

## 2022-06-04 NOTE — Patient Instructions (Signed)
Medication Instructions:  No changes     Lab Work:  Not needed    Testing/Procedures: Not needed   Follow-Up: At [facility] you and your health needs are our priority.  As part of our continuing mission to provide you with exceptional heart care, we have created designated Provider Care Teams.  These Care Teams include your primary Cardiologist (physician) and Advanced Practice Providers (APPs -  Physician Assistants and Nurse Practitioners) who all work together to provide you with the care you need, when you need it.  We recommend signing up for the patient portal called "MyChart".  Sign up information is provided on this After Visit Summary.  MyChart is used to connect with patients for Virtual Visits (Telemedicine).  Patients are able to view lab/test results, encounter notes, upcoming appointments, etc.  Non-urgent messages can be sent to your provider as well.   To learn more about what you can do with MyChart, go to [email or web address].    Your next appointment:   1 month(s)  The format for your next appointment:   In Person  Provider:   Glenetta Hew, MD     Other Instructions  You are cleared from a cardiac standpoint to have colonoscopy on Sept 8 ,2023   Important Information About Sugar

## 2022-06-04 NOTE — Progress Notes (Signed)
Primary Care Provider: Binnie Rail, MD [place] Cardiologist: Glenetta Hew, MD Electrophysiologist: None  Clinic Note: Chief Complaint  Patient presents with   Tachycardia   New Patient (Initial Visit)    Tachycardia.  Unfortunately this appointment was scheduled prior to the report of Zio patch monitor   ===================================  ASSESSMENT/PLAN   Problem List Items Addressed This Visit     Nonspecific abnormal electrocardiogram (ECG) (EKG)    Longstanding nonspecific findings noted on EKG.  Back in 2017 she had a nonischemic stress echo.  She is not having any symptoms that would warrant further evaluation.  We will await the results of monitor to ensure that there is no true arrhythmia.      Tachycardia - Primary    Truthfully, it sounds like she is just having intermittent faster heart rates than usual.  Not necessarily tachycardic except occasionally over 100 bpm.  Most likely etiology is all of her stress.  The social and psychological stress of her regular mother recently passed away while directly under her care.  This was then compounded by an emotional and social split from her brother who has shown signs of psychological changes especially following her mother's death.  She has not also been caregiver for her husband who has been sick.  Until we see the results of her monitor, we cannot tell what is going on but it sounds like she may be having mostly sinus rhythm and sinus tachycardia with occasional short spurts of atrial runs.  Does not sound like she is having any sustained arrhythmias.  Encourage adequate hydration, avoiding caffeine which may mean cutting out her daytime tea or reducing the amount. I do think that being on the SSRI will be very helpful.  Hopefully she can use this more so than the as needed Ativan.  This certainly should not preclude her from having her upcoming colonoscopy.      Relevant Orders   EKG 12-Lead     ===================================  HPI:    Brittany Myers is a 71 y.o. female notable for anxiety who is being seen today for the evaluation of INTERMITTENT TACHYCARDIA at the request of Burns, Claudina Lick, MD.  Brittany Myers was seen on August 28 by Dr. Quay Burow with complaints of abnormal heart rate noted on her Apple Watch indicating that rates were in the 90s as symptoms over 100 for for "no reason".  This initially occurred after being on a prednisone taper for 3 weeks.  She does exercise relatively consistently and denied any abnormal heart rates but noted that her heart rate was a little faster with exercise only.  Denies any palpitations or heart pounding.  She used her husband's [employer] (he has A-fib) and did not see any A-fib.  Since her thought to be probably related to stress with multiple ongoing issues (see Social History) => 7-day Zio patch ordered; Cardiology Referral Made Provided as needed Ativan to help with sleep but also started citalopram.  (Patient waited until being seen to start).  Recent Hospitalizations: None  Reviewed  CV studies:    The following studies were reviewed today: (if available, images/films reviewed: From Epic Chart or Care Everywhere) TREADMILL STRESS ECHO 05/26/2016: Electrically and echocardiographically negative stress echo.  No ischemia or infarction.  LOW RISK.  Baseline EF 55 to 60% with normal increase to 75-80% hyperdynamic function with exercise.  Normal recovery.  No wall motion abnormalities. Coronary Calcium Score (01/31/2021): Total score 0.  Minimal aortic  root calcification noted.  Zio patch (September 2023) none has been completed anterior DM, but not yet ready to read.:  Interval History:   Brittany Myers presents here today mostly because she wanted to be seen prior to her scheduled colonoscopy on September 8.  She understood that she that without the Zio patch monitor we would not build the months, she will just felt like she  did not want to have the opportunity to be seen and be cleared.  She describes to me that she is pretty much asymptomatic with noting her heart rate going fast besides the fact that makes her anxious. She noticed this wearing her Apple Watch were she had been seeing random recordings on her Apple Watch of heart rates in the 90s and occasionally low 100s.  This is at rest and relatively unusual for her.  She denies any sensation of fluttering or pounding or forceful beats.  No dizziness or lightheadedness.  No syncope or near syncope.  It just is stressful to her.  She has not had any chest pain or pressure with rest or exertion.  She continues to exercise at the gym 3 days a week and does well with that.  In fact she says that she does not necessarily notice that her heart rate being hard to manage while exercising.  She has had a couple episodes where she has felt a brief twinge of tightening up in her chest when she sees the heart rate going fast but it is probably more of a sensation of anxiety at seeing the heart rate as opposed to the opposite.  She does not have any exertional chest tightness.  She does acknowledge that she probably does not drink enough drive hydrating.  She has switched to decaf coffee since her husband was diagnosed with A-fib, but does drink a glass of iced tea at lunchtime mostly keep her somewhat energetic over the course of the afternoon.  She does not usually drink a lot of water.  CV Review of Symptoms (Summary): positive for - rapid heart rate and sense of anxiety when seeing her heart rate fast negative for - chest pain, dyspnea on exertion, edema, irregular heartbeat, orthopnea, palpitations, paroxysmal nocturnal dyspnea, shortness of breath, or lightheadedness, dizziness, wooziness or syncope/near syncope , TIA/amaurosis fugax or claudication.  REVIEWED OF SYSTEMS   Review of Systems  Constitutional:  Negative for malaise/fatigue (She sometimes feels a little bit  tired in the afternoon and feels better after drinking her tea.) and weight loss.  HENT:  Negative for congestion.   Psychiatric/Behavioral:  Positive for depression. The patient is nervous/anxious.   All other systems reviewed and are negative.   I have reviewed and (if needed) personally updated the patient's problem list, medications, allergies, past medical and surgical history, social and family history.   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  Minimal aortic root calcification   HEMORRHOID SURGERY     LAPAROSCOPIC ENDOMETRIOSIS FULGURATION     5 surgeries; Dr Irven [place],  Gyn   POLYPECTOMY  09/30/1999   Dr.Sam [place]    TREADMILL ECHO STRESS TEST  05/26/2016   Electrically and echocardiographically negative stress echo.  No ischemia or infarction.  LOW RISK.  Baseline EF 55 to 60% with normal increase to 75-80% hyperdynamic function with exercise.  Normal recovery.  No wall motion abnormalities.    Immunization History  Administered Date(s) Administered   Fluad Quad(high Dose 65+) 06/21/2019, 07/19/2020, 07/10/2021   Influenza Split 07/31/2011, 07/14/2012   Influenza Whole 07/18/2008, 10/10/2009, 06/18/2010   Influenza, High Dose Seasonal PF 07/28/2016, 07/17/2017, 06/24/2018   Influenza,inj,Quad PF,6+ Mos 07/20/2013, 07/20/2014, 07/24/2015   Moderna SARS-COV2 Booster Vaccination 10/06/2020   Moderna Sars-Covid-2 Vaccination 11/11/2019, 12/07/2019   Pneumococcal Conjugate-13 12/05/2016   Pneumococcal Polysaccharide-23 03/05/2018   Tdap 11/25/2006, 01/16/2017   Zoster, Live 03/26/2016     MEDICATIONS/ALLERGIES   Current Meds  Medication Sig   acetaminophen (TYLENOL) 500 MG tablet Take 500 mg by mouth every 6 (six) hours as needed.   alendronate (FOSAMAX) 70 MG tablet Take 1 tablet (70 mg total) by mouth once a week. Take with a full glass of water on an empty stomach.   citalopram (CELEXA) 10 MG tablet Take 1 tablet (10 mg total) by mouth daily.   clobetasol cream (TEMOVATE) 4.40 % Apply 1 application topically as needed.   EPINEPHrine 0.3 mg/0.3 mL IJ SOAJ injection Inject 0.3 mLs (0.3 mg total) into the muscle as needed.   estradiol (ESTRACE) 0.1 MG/GM vaginal cream Place 1 Applicatorful vaginally 2 (two) times a week.    LORazepam (ATIVAN) 0.5 MG tablet Take 1 tablet (0.5 mg total) by mouth at bedtime as needed for anxiety or sleep. take 1 tablet by mouth every 8 hours if needed for anxiety   Multiple Minerals-Vitamins (CALCIUM CITRATE +) TABS Taking daily   Multiple Vitamin (MULTIVITAMIN) capsule Take 1 capsule by mouth daily.   Na Sulfate-K Sulfate-Mg Sulf 17.5-3.13-1.6 GM/177ML SOLN Take 1 kit by mouth as directed. May use generic Suprep, no prior authorization. Take as directed.    Allergies  Allergen Reactions   Ciprofloxacin     REACTION: red ,itchy feet, "furry throat" Because of a history of documented adverse serious drug reaction;Medi Alert bracelet  is recommended   Penicillins     REACTION: angioedema Because of a history of documented adverse serious drug reaction;Medi Alert bracelet  is recommended   Terbinafine Hcl     Angioedema & shock 1999 from oral Lamisil  Because of a history of documented adverse serious drug reaction;Medi Alert bracelet  is recommended   Erythromycin     REACTION: headaches   Minocycline Hcl     REACTION: photosensitivity post sun exposure   Zolpidem Tartrate     REACTION: "weepy"    SOCIAL HISTORY/FAMILY HISTORY   Reviewed in Epic:   Social History   Tobacco Use   Smoking status: Never   Smokeless tobacco:  Never  Vaping Use   Vaping Use: Never used  Substance Use Topics   Alcohol use: Yes    Comment:   3/ week   Drug use: No   Social History   Social History Narrative   Heart healthy diet   Regular Exercise-yes (gym 3 times a week)   Lives with husband       Her mother recently died -> per patient her mother (age 66) was doing well but on the day of her death she noted feeling unusual with a discomfort in her throat and jaw  and a headache.  She was taken to the hospital by EMS based on the dispatch recommendations.  The next time she talked to a physician she was talking to the ER physician who is telling her that she was on multiple pressors to keep her blood pressures up enough to keep her alive.  Without them, she would not survive.  She was then seen by what sounds like a vascular surgeon who indicated that he felt like given her advanced age that she would not survive the potential corrective surgery.  Therefore once her family was able to be present, they withdrew care.         Subsequently, her brother had a significant change in his demeanor and became quite distant and evasive with her.  They have subsequently had a essentially complete separation.  This has been a source of a lot of stress with her.  Especially after her mother passing she was looking forward to being close to her brother.      Caregiver for her husband who was recently diagnosed with PMR-a significant stressor along with relatively recent diagnosis of A-fib.   Family History  Problem Relation Age of Onset   Endometriosis Mother    Hyperlipidemia Mother    Osteopenia Mother    Aortic dissection Mother    Heart failure Father        heart attack @ 78 after CVA   Stroke Father 49       hemorrhagic  after MI   Diabetes Father    Tremor Father    Hyperlipidemia Brother    Liver cancer Maternal Aunt    Alzheimer's disease Maternal Grandmother    Heart attack Maternal Grandfather 72   Colon cancer Paternal  Grandmother    Colon polyps Neg Hx    Esophageal cancer Neg Hx    Rectal cancer Neg Hx    Stomach cancer Neg Hx     OBJCTIVE -PE, EKG, labs   Wt Readings from Last 3 Encounters:  06/04/22 132 lb (59.9 kg)  05/26/22 133 lb (60.3 kg)  05/06/22 130 lb (59 kg)    Physical Exam: BP 124/68 (BP Location: Left Arm, Patient Position: Sitting, Cuff Size: Normal)   Pulse 85   Ht 5' 4"  (1.626 m)   Wt 132 lb (59.9 kg)   BMI 22.66 kg/m  Physical Exam Vitals reviewed.  Constitutional:      General: She is not in acute distress.    Appearance: Normal appearance. She is normal weight. She is not toxic-appearing.  HENT:     Head: Normocephalic and atraumatic.  Neck:     Vascular: No carotid bruit or JVD.  Cardiovascular:     Rate and Rhythm: Normal rate and regular rhythm. No extrasystoles are present.    Chest Wall: PMI is not displaced.     Pulses: Normal pulses.     Heart sounds: Normal heart sounds, S1 normal and S2 normal. No murmur heard.    No friction rub. No gallop.  Pulmonary:     Effort: Pulmonary effort is normal. No respiratory distress.     Breath sounds: Normal breath sounds. No wheezing, rhonchi or rales.  Chest:     Chest wall: No tenderness.  Abdominal:     General: Abdomen is flat. Bowel sounds are normal. There is no distension.     Palpations: Abdomen is soft.     Tenderness: There is no abdominal tenderness. There is no guarding or rebound.  Comments: No HSM or bruit  Musculoskeletal:        General: No swelling. Normal range of motion.     Cervical back: Normal range of motion and neck supple.  Skin:    General: Skin is warm and dry.  Neurological:     General: No focal deficit present.     Mental Status: She is alert and oriented to person, place, and time.     Cranial Nerves: No cranial nerve deficit (Grossly intact).     Gait: Gait normal.  Psychiatric:        Mood and Affect: Mood normal.        Behavior: Behavior normal.        Thought Content:  Thought content normal.        Judgment: Judgment normal.     Adult ECG Report  Rate: 85 ;  Rhythm: normal sinus rhythm and normal axis, intervals and durations.  Nonspecific ST-T wave changes. ;   Narrative Interpretation: Stable  Recent Labs: Reviewed. Lab Results  Component Value Date   CHOL 187 12/19/2020   HDL 64.40 12/19/2020   LDLCALC 108 (H) 12/19/2020   TRIG 74.0 12/19/2020   CHOLHDL 3 12/19/2020   Lab Results  Component Value Date   CREATININE 0.79 05/26/2022   BUN 18 05/26/2022   NA 139 05/26/2022   K 4.3 05/26/2022   CL 104 05/26/2022   CO2 28 05/26/2022      Latest Ref Rng & Units 05/26/2022    9:18 AM 12/23/2021    4:24 PM 12/19/2020    9:48 AM  CBC  WBC 4.0 - 10.5 K/uL 5.1  6.6  5.5   Hemoglobin 12.0 - 15.0 g/dL 15.1  14.9  14.9   Hematocrit 36.0 - 46.0 % 43.9  43.8  42.7   Platelets 150.0 - 400.0 K/uL 202.0  230.0  214.0     Lab Results  Component Value Date   HGBA1C 5.5 05/21/2018   Lab Results  Component Value Date   TSH 2.93 05/26/2022    ================================================== I spent a total of 42 minutes with the patient spent in direct patient consultation.  Additional time spent with chart review  / charting (studies, outside notes, etc): 26 min Total Time: 68 min  Current medicines are reviewed at length with the patient today.  (+/- concerns) was not sure about starting Celexa.  I recommended that she does.  Notice: This dictation was prepared with Dragon dictation along with smart phrase technology. Any transcriptional errors that result from this process are unintentional and may not be corrected upon review.   Studies Ordered:  Orders Placed This Encounter  Procedures   EKG 12-Lead   No orders of the defined types were placed in this encounter.   Patient Instructions / Medication Changes & Studies & Tests Ordered   Patient Instructions  Medication Instructions:  No changes     Lab Work:  Not  needed    Testing/Procedures: Not needed   Follow-Up: At [facility], you and your health needs are our priority.  As part of our continuing mission to provide you with exceptional heart care, we have created designated Provider Care Teams.  These Care Teams include your primary Cardiologist (physician) and Advanced Practice Providers (APPs -  Physician Assistants and Nurse Practitioners) who all work together to provide you with the care you need, when you need it.  We recommend signing up for the patient portal called "MyChart".  Sign up information  is provided on this After Visit Summary.  MyChart is used to connect with patients for Virtual Visits (Telemedicine).  Patients are able to view lab/test results, encounter notes, upcoming appointments, etc.  Non-urgent messages can be sent to your provider as well.   To learn more about what you can do with MyChart, go to [email or web address].    Your next appointment:   1 month(s)  The format for your next appointment:   In Person  Provider:   Glenetta Hew, MD     Other Instructions  You are cleared from a cardiac standpoint to have colonoscopy on Sept 8 ,2023   Important Information About Sugar      Leonie Man, MD, MS Glenetta Hew, M.D., M.S. Interventional Cardiologist  [place]  Pager # [phone] Phone # [phone] [address] [place],  [address]   Thank you for choosing [facility] [place]!!

## 2022-06-05 ENCOUNTER — Encounter: Payer: Self-pay | Admitting: Cardiology

## 2022-06-05 ENCOUNTER — Encounter: Payer: Medicare Other | Admitting: Neurology

## 2022-06-05 NOTE — Assessment & Plan Note (Addendum)
Truthfully, it sounds like she is just having intermittent faster heart rates than usual.  Not necessarily tachycardic except occasionally over 100 bpm.  Most likely etiology is all of her stress.  The social and psychological stress of her regular mother recently passed away while directly under her care.  This was then compounded by an emotional and social split from her brother who has shown signs of psychological changes especially following her mother's death.  She has not also been caregiver for her husband who has been sick.  Until we see the results of her monitor, we cannot tell what is going on but it sounds like she may be having mostly sinus rhythm and sinus tachycardia with occasional short spurts of atrial runs.  Does not sound like she is having any sustained arrhythmias.  Encourage adequate hydration, avoiding caffeine which may mean cutting out her daytime tea or reducing the amount. I do think that being on the SSRI will be very helpful.  Hopefully she can use this more so than the as needed Ativan.  This certainly should not preclude her from having her upcoming colonoscopy.

## 2022-06-05 NOTE — Assessment & Plan Note (Signed)
Longstanding nonspecific findings noted on EKG.  Back in 2017 she had a nonischemic stress echo.  She is not having any symptoms that would warrant further evaluation.  We will await the results of monitor to ensure that there is no true arrhythmia.

## 2022-06-06 ENCOUNTER — Ambulatory Visit (AMBULATORY_SURGERY_CENTER): Payer: Medicare Other | Admitting: Gastroenterology

## 2022-06-06 ENCOUNTER — Encounter: Payer: Self-pay | Admitting: Gastroenterology

## 2022-06-06 VITALS — BP 130/64 | HR 77 | Temp 97.8°F | Resp 12 | Ht 64.0 in | Wt 130.0 lb

## 2022-06-06 DIAGNOSIS — K635 Polyp of colon: Secondary | ICD-10-CM | POA: Diagnosis not present

## 2022-06-06 DIAGNOSIS — Z1211 Encounter for screening for malignant neoplasm of colon: Secondary | ICD-10-CM

## 2022-06-06 DIAGNOSIS — D123 Benign neoplasm of transverse colon: Secondary | ICD-10-CM

## 2022-06-06 MED ORDER — SODIUM CHLORIDE 0.9 % IV SOLN: 500.0000 mL | Freq: Once | INTRAVENOUS | Status: DC

## 2022-06-06 NOTE — Progress Notes (Signed)
Pt states that she recently has had episodes of a "fast, erratic" heart reate.  She has seen her primary as well as a cardiologist and wore a zio monitor. Brittany Kern, CRNA notified.

## 2022-06-06 NOTE — Progress Notes (Signed)
[facility] History and Physical   Primary Care Physician:  Binnie Rail, MD   Reason for Procedure:   Colon cancer screening  Plan:    Screening colonoscopy     HPI: Brittany Myers is a 71 y.o. female undergoing initial average risk screening colonoscopy.  She has no family history of colon cancer and no chronic GI symptoms other than a 'nervous stomach'.  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  Minimal aortic root calcification   HEMORRHOID SURGERY     LAPAROSCOPIC ENDOMETRIOSIS FULGURATION     5 surgeries; Dr Irven [place], Gyn   POLYPECTOMY  09/30/1999   Dr.Sam Callender    TREADMILL ECHO STRESS TEST  05/26/2016   Electrically and echocardiographically negative stress echo.  No ischemia or infarction.  LOW RISK.  Baseline EF 55 to 60% with normal increase to 75-80% hyperdynamic function with exercise.  Normal recovery.  No wall motion abnormalities.    Prior to Admission medications   Medication Sig Start Date End Date Taking? Authorizing Provider  acetaminophen (TYLENOL) 500 MG tablet Take 500 mg by mouth every 6 (six) hours as needed.   Yes [provider]  LORazepam (ATIVAN) 0.5 MG tablet Take 1 tablet (0.5 mg total) by mouth at bedtime as needed for anxiety or sleep. take 1 tablet by mouth every 8 hours if needed for anxiety 05/26/22  Yes Burns, Claudina Lick, MD  alendronate (FOSAMAX) 70 MG tablet Take 1 tablet (70 mg total) by mouth once a week. Take with a full glass of water on an empty stomach. 12/18/20   Burns, Claudina Lick, MD  citalopram (CELEXA) 10 MG tablet Take 1 tablet (10 mg total) by mouth  daily. Patient not taking: Reported on 06/06/2022 05/26/22   Binnie Rail, MD  clobetasol cream (TEMOVATE) 9.50 % Apply 1 application topically as needed.    [provider]  EPINEPHrine 0.3 mg/0.3 mL IJ SOAJ injection Inject 0.3 mLs (0.3 mg total) into the muscle as needed. 05/04/20   Binnie Rail, MD  estradiol (ESTRACE) 0.1 MG/GM vaginal cream Place 1 Applicatorful vaginally 2 (two) times a week.     [provider]  Multiple Minerals-Vitamins (CALCIUM CITRATE +) TABS Taking daily 06/28/19   Binnie Rail, MD  Multiple Vitamin (MULTIVITAMIN) capsule Take 1 capsule by mouth daily. 06/28/19   Binnie Rail, MD    Current Outpatient Medications  Medication Sig Dispense Refill   acetaminophen (TYLENOL) 500 MG tablet Take 500 mg by mouth every 6 (six) hours as needed.     LORazepam (ATIVAN) 0.5 MG tablet Take 1 tablet (0.5 mg total) by mouth at bedtime as needed for anxiety or sleep. take 1 tablet by mouth every 8 hours if needed for anxiety 30 tablet 2   alendronate (FOSAMAX) 70 MG tablet Take 1 tablet (70 mg total) by mouth once a week. Take with a full glass of water on an empty stomach. 12 tablet 3   citalopram (CELEXA) 10 MG tablet Take 1  tablet (10 mg total) by mouth daily. (Patient not taking: Reported on 06/06/2022) 90 tablet 2   clobetasol cream (TEMOVATE) 2.50 % Apply 1 application topically as needed.     EPINEPHrine 0.3 mg/0.3 mL IJ SOAJ injection Inject 0.3 mLs (0.3 mg total) into the muscle as needed. 2 each 0   estradiol (ESTRACE) 0.1 MG/GM vaginal cream Place 1 Applicatorful vaginally 2 (two) times a week.      Multiple Minerals-Vitamins (CALCIUM CITRATE +) TABS Taking daily  0   Multiple Vitamin (MULTIVITAMIN) capsule Take 1 capsule by mouth daily.     Current Facility-Administered Medications  Medication Dose Route Frequency Provider Last Rate Last Admin   0.9 %  sodium chloride infusion  500 mL Intravenous Once Daryel November, MD        Allergies as of  06/06/2022 - Review Complete 06/06/2022  Allergen Reaction Noted   Ciprofloxacin  05/15/2010   Penicillins  05/15/2010   Terbinafine hcl  05/20/2011   Erythromycin  05/15/2010   Minocycline hcl  05/15/2010   Zolpidem tartrate  05/15/2010    Family History  Problem Relation Age of Onset   Endometriosis Mother    Hyperlipidemia Mother    Osteopenia Mother    Aortic dissection Mother    Heart failure Father        heart attack @ 22 after CVA   Stroke Father 50       hemorrhagic  after MI   Diabetes Father    Tremor Father    Hyperlipidemia Brother    Liver cancer Maternal Aunt    Alzheimer's disease Maternal Grandmother    Heart attack Maternal Grandfather 29   Colon cancer Paternal Grandmother    Colon polyps Neg Hx    Esophageal cancer Neg Hx    Rectal cancer Neg Hx    Stomach cancer Neg Hx     Social History   Socioeconomic History   Marital status: Married    Spouse name: Not on file   Number of children: 0   Years of education: Not on file   Highest education level: Bachelor's degree (e.g., BA, AB, BS)  Occupational History   Occupation: Human resources officer  Tobacco Use   Smoking status: Never   Smokeless tobacco: Never  Vaping Use   Vaping Use: Never used  Substance and Sexual Activity   Alcohol use: Yes    Comment:   3/ week   Drug use: No   Sexual activity: Yes  Other Topics Concern   Not on file  Social History Narrative   Heart healthy diet   Regular Exercise-yes (gym 3 times a week)   Lives with husband       Her mother recently died -> per patient her mother (age 24) was doing well but on the day of her death she noted feeling unusual with a discomfort in her throat and jaw and a headache.  She was taken to the hospital by EMS based on the dispatch recommendations.  The next time she talked to a physician she was talking to the ER physician who is telling her that she was on multiple pressors to keep her blood pressures up enough  to keep her alive.  Without them, she would not survive.  She was then seen by what sounds like a vascular surgeon who indicated that he felt like given her advanced age that she would not survive the potential corrective surgery.  Therefore once her family was able to be  present, they withdrew care.         Subsequently, her brother had a significant change in his demeanor and became quite distant and evasive with her.  They have subsequently had a essentially complete separation.  This has been a source of a lot of stress with her.  Especially after her mother passing she was looking forward to being close to her brother.      Caregiver for her husband who was recently diagnosed with PMR-a significant stressor along with relatively recent diagnosis of A-fib.   Social Determinants of Health   Financial Resource Strain: Low Risk  (12/06/2021)   Overall Financial Resource Strain (CARDIA)    Difficulty of Paying Living Expenses: Not hard at all  Food Insecurity: No Food Insecurity (12/06/2021)   Hunger Vital Sign    Worried About Running Out of Food in the Last Year: Never true    Ran Out of Food in the Last Year: Never true  Transportation Needs: No Transportation Needs (12/06/2021)   PRAPARE - Hydrologist (Medical): No    Lack of Transportation (Non-Medical): No  Physical Activity: Sufficiently Active (12/06/2021)   Exercise Vital Sign    Days of Exercise per Week: 3 days    Minutes of Exercise per Session: 60 min  Stress: No Stress Concern Present (12/06/2021)   [place]    Feeling of Stress : Not at all  Social Connections: Moderately Integrated (12/06/2021)   Social Connection and Isolation Panel [NHANES]    Frequency of Communication with Friends and Family: Three times a week    Frequency of Social Gatherings with Friends and Family: Three times a week    Attends Religious Services: 1 to 4  times per year    Active Member of Clubs or Organizations: No    Attends Archivist Meetings: Never    Marital Status: Married  Human resources officer Violence: Not At Risk (12/06/2021)   Humiliation, Afraid, Rape, and Kick questionnaire    Fear of Current or Ex-Partner: No    Emotionally Abused: No    Physically Abused: No    Sexually Abused: No    Review of Systems:  All other review of systems negative except as mentioned in the HPI.  Physical Exam: Vital signs BP 121/68   Pulse 77   Temp 97.8 F (36.6 C)   Ht '5\' 4"'$  (1.626 m)   Wt 130 lb (59 kg)   SpO2 96%   BMI 22.31 kg/m   General:   Alert,  Well-developed, well-nourished, pleasant and cooperative in NAD Airway:  Mallampati 2 Lungs:  Clear throughout to auscultation.   Heart:  Regular rate and rhythm; no murmurs, clicks, rubs,  or gallops. Abdomen:  Soft, nontender and nondistended. Normal bowel sounds.   Neuro/Psych:  Normal mood and affect. A and O x 3   Lemoyne Nestor E. Candis Schatz, MD [facility]

## 2022-06-06 NOTE — Progress Notes (Signed)
Sedate, gd SR, tolerated procedure well, VSS, report to RN 

## 2022-06-06 NOTE — Progress Notes (Signed)
Called to room to assist during endoscopic procedure.  Patient ID and intended procedure confirmed with present staff. Received instructions for my participation in the procedure from the performing physician.  

## 2022-06-06 NOTE — Op Note (Signed)
[facility] Patient Name: Brittany Myers Procedure Date: 06/06/2022 7:08 AM MRN: [id number] Endoscopist: Nicki Reaper E. Candis Schatz , MD Age: 71 Referring MD:  Date of Birth: 1951-05-28 Gender: Female Account #: [id number] Procedure:                Colonoscopy Indications:              Screening for colorectal malignant neoplasm (last                            colonoscopy was 10 years ago) Medicines:                Monitored Anesthesia Care Procedure:                Pre-Anesthesia Assessment:                           - Prior to the procedure, a History and Physical                            was performed, and patient medications and                            allergies were reviewed. The patient's tolerance of                            previous anesthesia was also reviewed. The risks                            and benefits of the procedure and the sedation                            options and risks were discussed with the patient.                            All questions were answered, and informed consent                            was obtained. Prior Anticoagulants: The patient has                            taken no previous anticoagulant or antiplatelet                            agents. ASA Grade Assessment: II - A patient with                            mild systemic disease. After reviewing the risks                            and benefits, the patient was deemed in                            satisfactory condition to undergo the procedure.  After obtaining informed consent, the colonoscope                            was passed under direct vision. Throughout the                            procedure, the patient's blood pressure, pulse, and                            oxygen saturations were monitored continuously. The                            Olympus CF-HQ190L [id number]) Colonoscope was                            introduced through the anus and  advanced to the the                            terminal ileum, with identification of the                            appendiceal orifice and IC valve. The colonoscopy                            was performed without difficulty. The patient                            tolerated the procedure well. The quality of the                            bowel preparation was excellent. The terminal                            ileum, ileocecal valve, appendiceal orifice, and                            rectum were photographed. Scope In: 8:08:55 AM Scope Out: 8:22:54 AM Scope Withdrawal Time: 0 hours 10 minutes 6 seconds  Total Procedure Duration: 0 hours 13 minutes 59 seconds  Findings:                 The perianal and digital rectal examinations were                            normal. Pertinent negatives include normal                            sphincter tone and no palpable rectal lesions.                           Two flat polyps were found in the transverse colon.                            The polyps were 3 mm in size. These polyps were  removed with a cold snare. Resection and retrieval                            were complete. Estimated blood loss was minimal.                           Multiple small and large-mouthed diverticula were                            found in the sigmoid colon and descending colon.                           The exam was otherwise normal throughout the                            examined colon.                           The terminal ileum appeared normal.                           The retroflexed view of the distal rectum and anal                            verge was normal and showed no anal or rectal                            abnormalities. Complications:            No immediate complications. Estimated Blood Loss:     Estimated blood loss was minimal. Impression:               - Two 3 mm polyps in the transverse colon, removed                             with a cold snare. Resected and retrieved. These                            appeared hyperplastic                           - Diverticulosis in the sigmoid colon and in the                            descending colon.                           - The examined portion of the ileum was normal.                           - The distal rectum and anal verge are normal on                            retroflexion view. Recommendation:           - Patient has a contact number available  for                            emergencies. The signs and symptoms of potential                            delayed complications were discussed with the                            patient. Return to normal activities tomorrow.                            Written discharge instructions were provided to the                            patient.                           - Resume previous diet.                           - Continue present medications.                           - Await pathology results.                           - Recommendations for repeat colonoscopy will be                            based on pathology results, but if polyps are                            hyperplastic, would recommend against further colon                            cancer screening given patient's age and lack of                            precancerous lesions. Arayna Illescas E. Candis Schatz, MD 06/06/2022 8:31:43 AM This report has been signed electronically.

## 2022-06-06 NOTE — Patient Instructions (Signed)
Resume previous diet and medications. Awaiting pathology results. Repeat Colonoscopy date to be determined based on pathology results.  YOU HAD AN ENDOSCOPIC PROCEDURE TODAY AT [facility]:   Refer to the procedure report that was given to you for any specific questions about what was found during the examination.  If the procedure report does not answer your questions, please call your gastroenterologist to clarify.  If you requested that your care partner not be given the details of your procedure findings, then the procedure report has been included in a sealed envelope for you to review at your convenience later.  YOU SHOULD EXPECT: Some feelings of bloating in the abdomen. Passage of more gas than usual.  Walking can help get rid of the air that was put into your GI tract during the procedure and reduce the bloating. If you had a lower endoscopy (such as a colonoscopy or flexible sigmoidoscopy) you may notice spotting of blood in your stool or on the toilet paper. If you underwent a bowel prep for your procedure, you may not have a normal bowel movement for a few days.  Please Note:  You might notice some irritation and congestion in your nose or some drainage.  This is from the oxygen used during your procedure.  There is no need for concern and it should clear up in a day or so.  SYMPTOMS TO REPORT IMMEDIATELY:  Following lower endoscopy (colonoscopy or flexible sigmoidoscopy):  Excessive amounts of blood in the stool  Significant tenderness or worsening of abdominal pains  Swelling of the abdomen that is new, acute  Fever of 100F or higher  For urgent or emergent issues, a gastroenterologist can be reached at any hour by calling [phone]. Do not use MyChart messaging for urgent concerns.    DIET:  We do recommend a small meal at first, but then you may proceed to your regular diet.  Drink plenty of fluids but you should avoid alcoholic beverages for 24  hours.  ACTIVITY:  You should plan to take it easy for the rest of today and you should NOT DRIVE or use heavy machinery until tomorrow (because of the sedation medicines used during the test).    FOLLOW UP: Our staff will call the number listed on your records the next business day following your procedure.  We will call around 7:15- 8:00 am to check on you and address any questions or concerns that you may have regarding the information given to you following your procedure. If we do not reach you, we will leave a message.  If you develop any symptoms (ie: fever, flu-like symptoms, shortness of breath, cough etc.) before then, please call [phone].  If you test positive for Covid 19 in the 2 weeks post procedure, please call and report this information to [place].    If any biopsies were taken you will be contacted by phone or by letter within the next 1-3 weeks.  Please call us at [phone] if you have not heard about the biopsies in 3 weeks.    SIGNATURES/CONFIDENTIALITY: You and/or your care partner have signed paperwork which will be entered into your electronic medical record.  These signatures attest to the fact that that the information above on your After Visit Summary has been reviewed and is understood.  Full responsibility of the confidentiality of this discharge information lies with you and/or your care-partner.

## 2022-06-09 ENCOUNTER — Telehealth: Payer: Self-pay

## 2022-06-09 NOTE — Telephone Encounter (Signed)
Attempted to reach patient for post-procedure f/u call. No answer. Left message for her to please not hesitate to call us if she has any questions/concerns regarding her care. 

## 2022-06-10 DIAGNOSIS — R000 Tachycardia, unspecified: Secondary | ICD-10-CM | POA: Diagnosis not present

## 2022-06-16 NOTE — Progress Notes (Signed)
Brittany Myers,  Good news: the polyps that I removed during your recent examination were NOT precancerous.  Given your age and lack of high risk polyps, I recommend against further colon cancer screening.  If you develop any new rectal bleeding, abdominal pain or significant bowel habit changes, please contact me before then.

## 2022-07-01 ENCOUNTER — Ambulatory Visit: Payer: Medicare Other | Admitting: Cardiology

## 2022-07-08 ENCOUNTER — Ambulatory Visit (INDEPENDENT_AMBULATORY_CARE_PROVIDER_SITE_OTHER): Payer: Medicare Other | Admitting: *Deleted

## 2022-07-08 DIAGNOSIS — Z23 Encounter for immunization: Secondary | ICD-10-CM | POA: Diagnosis not present

## 2022-07-08 NOTE — Progress Notes (Signed)
Administered high dose flu shot right deltoid. Pt tolerated well. 

## 2022-07-08 NOTE — Progress Notes (Unsigned)
Cardiology Office Note:    Date:  07/09/2022   ID:  Brittany Myers, DOB [birth year], MRN [id number]  PCP:  Binnie Rail, MD [place] Cardiologist: Glenetta Hew, MD   Reason for visit: 1 month follow-up  History of Present Illness:    Brittany Myers is a 71 y.o. female with a hx of palpitations.  She last saw Dr. Ellyn Hack in September 2023.  She noted that her Apple Watch had readings of heart rate 90s to 100s at rest.  She denied palpitations, lightheadedness and chest pain.  She exercises at the gym 3 days a week.  Her slightly elevated heart rate was thought secondary to anxiety and stress following her mother's death and caring for her husband who is sick.  He was cleared for colonoscopy.  She had a calcium score of 0 in 2023.  1 week Zio returned with underlying sinus normal sinus rhythm with avg HR 75, rare ectopy.  No evidence of VT, SVT or atrial fibrillation.  Today, she mentions occasional heart rate over 100.  Sometimes this can be associated with slight chest tightness.  She does states she exercises regularly doing cardiovascular exercises, weight training and yoga without chest pain.  She denies dyspnea on exertion, lightheadedness, syncope, PND, orthopnea lower extremity edema.  She is still dealing with stress with family issues and her husband recently diagnosed with PMR.  She has not been taking citalopram because she rather avoid medications.       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  Minimal aortic root calcification   HEMORRHOID SURGERY     LAPAROSCOPIC ENDOMETRIOSIS FULGURATION     5 surgeries; Dr Irven [place], Gyn   POLYPECTOMY   09/30/1999   Dr.Sam Platte    TREADMILL ECHO STRESS TEST  05/26/2016   Electrically and echocardiographically negative stress echo.  No ischemia or infarction.  LOW RISK.  Baseline EF 55 to 60% with normal increase to 75-80% hyperdynamic function with exercise.  Normal recovery.  No wall motion abnormalities.    Current Medications: Current Meds  Medication Sig   acetaminophen (TYLENOL) 500 MG tablet Take 500 mg by mouth every 6 (six) hours as needed.   alendronate (FOSAMAX) 70 MG tablet Take 1 tablet (70 mg total) by mouth once a week. Take with a full glass of water on an empty stomach.   citalopram (CELEXA) 10 MG tablet Take 1 tablet (10 mg total) by mouth daily.   clobetasol cream (TEMOVATE) 4.09 % Apply 1 application topically as needed.   EPINEPHrine 0.3 mg/0.3 mL IJ SOAJ injection Inject 0.3 mLs (0.3 mg total) into the muscle as needed.   estradiol (ESTRACE) 0.1 MG/GM vaginal cream Place 1 Applicatorful vaginally 2 (two) times a week.    LORazepam (ATIVAN) 0.5 MG tablet Take 1 tablet (0.5 mg total) by mouth at bedtime as needed for anxiety or sleep. take 1 tablet by mouth every 8 hours if needed for anxiety   Multiple Minerals-Vitamins (CALCIUM CITRATE +) TABS Taking daily   Multiple Vitamin (MULTIVITAMIN) capsule Take 1 capsule  by mouth daily.     Allergies:   Ciprofloxacin, Penicillins, Terbinafine hcl, Erythromycin, Minocycline hcl, and Zolpidem tartrate   Social History   Socioeconomic History   Marital status: Married    Spouse name: Not on file   Number of children: 0   Years of education: Not on file   Highest education level: Bachelor's degree (e.g., BA, AB, BS)  Occupational History   Occupation: Human resources officer  Tobacco Use   Smoking status: Never   Smokeless tobacco: Never  Vaping Use   Vaping Use: Never used  Substance and Sexual Activity   Alcohol use: Yes    Comment:   3/ week   Drug use: No   Sexual activity: Yes  Other Topics  Concern   Not on file  Social History Narrative   Heart healthy diet   Regular Exercise-yes (gym 3 times a week)   Lives with husband       Her mother recently died -> per patient her mother (age 74) was doing well but on the day of her death she noted feeling unusual with a discomfort in her throat and jaw and a headache.  She was taken to the hospital by EMS based on the dispatch recommendations.  The next time she talked to a physician she was talking to the ER physician who is telling her that she was on multiple pressors to keep her blood pressures up enough to keep her alive.  Without them, she would not survive.  She was then seen by what sounds like a vascular surgeon who indicated that he felt like given her advanced age that she would not survive the potential corrective surgery.  Therefore once her family was able to be present, they withdrew care.         Subsequently, her brother had a significant change in his demeanor and became quite distant and evasive with her.  They have subsequently had a essentially complete separation.  This has been a source of a lot of stress with her.  Especially after her mother passing she was looking forward to being close to her brother.      Caregiver for her husband who was recently diagnosed with PMR-a significant stressor along with relatively recent diagnosis of A-fib.   Social Determinants of Health   Financial Resource Strain: Low Risk  (12/06/2021)   Overall Financial Resource Strain (CARDIA)    Difficulty of Paying Living Expenses: Not hard at all  Food Insecurity: No Food Insecurity (12/06/2021)   Hunger Vital Sign    Worried About Running Out of Food in the Last Year: Never true    Ran Out of Food in the Last Year: Never true  Transportation Needs: No Transportation Needs (12/06/2021)   PRAPARE - Hydrologist (Medical): No    Lack of Transportation (Non-Medical): No  Physical Activity: Sufficiently Active  (12/06/2021)   Exercise Vital Sign    Days of Exercise per Week: 3 days    Minutes of Exercise per Session: 60 min  Stress: No Stress Concern Present (12/06/2021)   [place]    Feeling of Stress : Not at all  Social Connections: Moderately Integrated (12/06/2021)   Social Connection and Isolation Panel [NHANES]    Frequency of Communication with Friends and Family: Three times a week    Frequency of Social Gatherings with Friends and Family: Three times a week    Attends Religious  Services: 1 to 4 times per year    Active Member of Clubs or Organizations: No    Attends Archivist Meetings: Never    Marital Status: Married     Family History: The patient's family history includes Alzheimer's disease in her maternal grandmother; Aortic dissection in her mother; Colon cancer in her paternal grandmother; Diabetes in her father; Endometriosis in her mother; Heart attack (age of onset: 1) in her maternal grandfather; Heart failure in her father; Hyperlipidemia in her brother and mother; Liver cancer in her maternal aunt; Osteopenia in her mother; Stroke (age of onset: 51) in her father; Tremor in her father. There is no history of Colon polyps, Esophageal cancer, Rectal cancer, or Stomach cancer.  ROS:   Please see the history of present illness.     EKGs/Labs/Other Studies Reviewed:    Recent Labs: 05/26/2022: ALT 20; BUN 18; Creatinine, Ser 0.79; Hemoglobin 15.1; Platelets 202.0; Potassium 4.3; Sodium 139; TSH 2.93   Recent Lipid Panel Lab Results  Component Value Date/Time   CHOL 187 12/19/2020 09:48 AM   CHOL 206 (H) 07/20/2014 10:37 AM   TRIG 74.0 12/19/2020 09:48 AM   TRIG 71 07/20/2014 10:37 AM   HDL 64.40 12/19/2020 09:48 AM   HDL 69 07/20/2014 10:37 AM   LDLCALC 108 (H) 12/19/2020 09:48 AM   LDLCALC 123 (H) 07/20/2014 10:37 AM   LDLDIRECT 116.2 05/20/2012 10:29 AM    Physical Exam:    VS:  BP  126/70   Pulse 88   Ht '5\' 4"'$  (1.626 m)   Wt 132 lb 3.2 oz (60 kg)   SpO2 98%   BMI 22.69 kg/m    No data found.       Wt Readings from Last 3 Encounters:  07/09/22 132 lb 3.2 oz (60 kg)  06/06/22 130 lb (59 kg)  06/04/22 132 lb (59.9 kg)     GEN:  Well nourished, well developed in no acute distress HEENT: Normal NECK: No JVD; No carotid bruits CARDIAC: RRR, no murmurs, rubs, gallops RESPIRATORY:  Clear to auscultation without rales, wheezing or rhonchi  ABDOMEN: Soft, non-tender, non-distended MUSCULOSKELETAL: No edema SKIN: Warm and dry NEUROLOGIC:  Alert and oriented PSYCHIATRIC:  Normal affect     ASSESSMENT AND PLAN   Episodic sinus tachycardia -likely stress/anxiety related. -No significant arrhythmias on 1 week event monitor -Continue to encourage hydration and limit caffeine and alcohol use. -Continue daily exercise and stress management with yoga. -Given her reassurance today.  Avoid over-monitoring.    Precordial pain -Mentions some chest tightness when her heart rate gets over 100. -Likely stress/anxiety related -With a calcium score of 0 in 2022 & no classic anginal symptoms with exertion, will defer on further work-up.  Disposition - Follow-up as needed.   Medication Adjustments/Labs and Tests Ordered: Current medicines are reviewed at length with the patient today.  Concerns regarding medicines are outlined above.  No orders of the defined types were placed in this encounter.  No orders of the defined types were placed in this encounter.   Patient Instructions  Medication Instructions:  No Changes *If you need a refill on your cardiac medications before your next appointment, please call your pharmacy*   Lab Work: No Labs If you have labs (blood work) drawn today and your tests are completely normal, you will receive your results only by: [place] (if you have MyChart) OR A paper copy in the mail If you have any lab test that is  abnormal  or we need to change your treatment, we will call you to review the results.   Testing/Procedures: No Testing   Follow-Up: At [facility], you and your health needs are our priority.  As part of our continuing mission to provide you with exceptional heart care, we have created designated Provider Care Teams.  These Care Teams include your primary Cardiologist (physician) and Advanced Practice Providers (APPs -  Physician Assistants and Nurse Practitioners) who all work together to provide you with the care you need, when you need it.  We recommend signing up for the patient portal called "MyChart".  Sign up information is provided on this After Visit Summary.  MyChart is used to connect with patients for Virtual Visits (Telemedicine).  Patients are able to view lab/test results, encounter notes, upcoming appointments, etc.  Non-urgent messages can be sent to your provider as well.   To learn more about what you can do with MyChart, go to [email or web address].    Your next appointment:   Follow up As Needed  The format for your next appointment:   In Person  Provider:   Glenetta Hew, MD     Signed, Warren Lacy, PA-C  07/09/2022 10:42 AM    [place]

## 2022-07-09 ENCOUNTER — Encounter: Payer: Self-pay | Admitting: Physician Assistant

## 2022-07-09 ENCOUNTER — Ambulatory Visit: Payer: Medicare Other | Attending: Physician Assistant | Admitting: Physician Assistant

## 2022-07-09 VITALS — BP 126/70 | HR 88 | Ht 64.0 in | Wt 132.2 lb

## 2022-07-09 DIAGNOSIS — R000 Tachycardia, unspecified: Secondary | ICD-10-CM | POA: Diagnosis not present

## 2022-07-09 DIAGNOSIS — R072 Precordial pain: Secondary | ICD-10-CM

## 2022-07-09 NOTE — Patient Instructions (Signed)
Medication Instructions:  No Changes *If you need a refill on your cardiac medications before your next appointment, please call your pharmacy*   Lab Work: No Labs If you have labs (blood work) drawn today and your tests are completely normal, you will receive your results only by: [place] (if you have MyChart) OR A paper copy in the mail If you have any lab test that is abnormal or we need to change your treatment, we will call you to review the results.   Testing/Procedures: No Testing   Follow-Up: At [facility] you and your health needs are our priority.  As part of our continuing mission to provide you with exceptional heart care, we have created designated Provider Care Teams.  These Care Teams include your primary Cardiologist (physician) and Advanced Practice Providers (APPs -  Physician Assistants and Nurse Practitioners) who all work together to provide you with the care you need, when you need it.  We recommend signing up for the patient portal called "MyChart".  Sign up information is provided on this After Visit Summary.  MyChart is used to connect with patients for Virtual Visits (Telemedicine).  Patients are able to view lab/test results, encounter notes, upcoming appointments, etc.  Non-urgent messages can be sent to your provider as well.   To learn more about what you can do with MyChart, go to [email or web address].    Your next appointment:   Follow up As Needed  The format for your next appointment:   In Person  Provider:   Glenetta Hew, MD

## 2022-10-06 ENCOUNTER — Encounter: Payer: Self-pay | Admitting: Neurology

## 2022-10-06 ENCOUNTER — Encounter: Payer: Self-pay | Admitting: Internal Medicine

## 2022-10-06 DIAGNOSIS — R29898 Other symptoms and signs involving the musculoskeletal system: Secondary | ICD-10-CM

## 2022-10-11 ENCOUNTER — Encounter: Payer: Self-pay | Admitting: Internal Medicine

## 2022-10-13 MED ORDER — BENZONATATE 200 MG PO CAPS: 200.0000 mg | ORAL_CAPSULE | Freq: Three times a day (TID) | ORAL | 0 refills | Status: DC | PRN

## 2022-10-24 ENCOUNTER — Other Ambulatory Visit: Payer: Self-pay | Admitting: Internal Medicine

## 2022-10-29 DIAGNOSIS — I788 Other diseases of capillaries: Secondary | ICD-10-CM | POA: Diagnosis not present

## 2022-10-29 DIAGNOSIS — M1811 Unilateral primary osteoarthritis of first carpometacarpal joint, right hand: Secondary | ICD-10-CM | POA: Diagnosis not present

## 2022-10-29 DIAGNOSIS — L570 Actinic keratosis: Secondary | ICD-10-CM | POA: Diagnosis not present

## 2022-10-29 DIAGNOSIS — Z85828 Personal history of other malignant neoplasm of skin: Secondary | ICD-10-CM | POA: Diagnosis not present

## 2022-10-29 DIAGNOSIS — M79641 Pain in right hand: Secondary | ICD-10-CM | POA: Diagnosis not present

## 2022-10-29 DIAGNOSIS — L603 Nail dystrophy: Secondary | ICD-10-CM | POA: Diagnosis not present

## 2022-11-24 ENCOUNTER — Telehealth: Payer: Self-pay

## 2022-11-24 NOTE — Telephone Encounter (Signed)
Contacted Brittany Myers to schedule their annual wellness visit. Appointment made for 12/11/22. Norton Blizzard, [place] ([facility])  [place] [facility] [phone]

## 2022-12-11 ENCOUNTER — Ambulatory Visit (INDEPENDENT_AMBULATORY_CARE_PROVIDER_SITE_OTHER): Payer: Medicare Other

## 2022-12-11 VITALS — HR 86 | Ht 64.0 in | Wt 132.0 lb

## 2022-12-11 DIAGNOSIS — Z0000 Encounter for general adult medical examination without abnormal findings: Secondary | ICD-10-CM

## 2022-12-11 NOTE — Patient Instructions (Addendum)
Brittany Myers , Thank you for taking time to come for your Medicare Wellness Visit. I appreciate your ongoing commitment to your health goals. Please review the following plan we discussed and let me know if I can assist you in the future.   These are the goals we discussed: Patient Stated     On track        Increase my physical activity by doing more yoga and get back to church as soon as possible.      This is a list of the screening recommended for you and due dates:  Health Maintenance  Topic Date Due   DEXA scan (bone density measurement)  10/16/2021   COVID-19 Vaccine (4 - 2023-24 season) 12/27/2022*   Zoster (Shingles) Vaccine (1 of 2) 03/13/2023*   Medicare Annual Wellness Visit  12/11/2023   Mammogram  05/09/2024   DTaP/Tdap/Td vaccine (3 - Td or Tdap) 01/17/2027   Pneumonia Vaccine  Completed   Flu Shot  Completed   Hepatitis C Screening: USPSTF Recommendation to screen - Ages 18-79 yo.  Completed   HPV Vaccine  Aged Out   Colon Cancer Screening  Discontinued  *Topic was postponed. The date shown is not the original due date.    Advanced directives: End of life planning; Advance aging; Advanced directives discussed.  Copy of current HCPOA/Living Will requested.    End of life planning; Advanced aging; Advanced directives discussed.  No HCPOA/Living Will.  Additional information declined at this time.  End of life planning; Advanced aging; Advanced directives discussed.  No HCPOA/Living Will.  Additional information provided to help them start the conversation with family.  Copy of HCPOA/Living Will requested upon completion. Time spent on this topic is 25 minutes.    Conditions/risks identified: none new.  Next appointment: Follow up in one year for your annual wellness visit    Preventive Care 65 Years and Older, Female Preventive care refers to lifestyle choices and visits with your health care provider that can promote health and wellness. What does preventive care  include? A yearly physical exam. This is also called an annual well check. Dental exams once or twice a year. Routine eye exams. Ask your health care provider how often you should have your eyes checked. Personal lifestyle choices, including: Daily care of your teeth and gums. Regular physical activity. Eating a healthy diet. Avoiding tobacco and drug use. Limiting alcohol use. Practicing safe sex. Taking low-dose aspirin every day. Taking vitamin and mineral supplements as recommended by your health care provider. What happens during an annual well check? The services and screenings done by your health care provider during your annual well check will depend on your age, overall health, lifestyle risk factors, and family history of disease. Counseling  Your health care provider may ask you questions about your: Alcohol use. Tobacco use. Drug use. Emotional well-being. Home and relationship well-being. Sexual activity. Eating habits. History of falls. Memory and ability to understand (cognition). Work and work Statistician. Reproductive health. Screening  You may have the following tests or measurements: Height, weight, and BMI. Blood pressure. Lipid and cholesterol levels. These may be checked every 5 years, or more frequently if you are over 59 years old. Skin check. Lung cancer screening. You may have this screening every year starting at age 45 if you have a 30-pack-year history of smoking and currently smoke or have quit within the past 15 years. Fecal occult blood test (FOBT) of the stool. You may have this test every  year starting at age 46. Flexible sigmoidoscopy or colonoscopy. You may have a sigmoidoscopy every 5 years or a colonoscopy every 10 years starting at age 40. Hepatitis C blood test. Hepatitis B blood test. Sexually transmitted disease (STD) testing. Diabetes screening. This is done by checking your blood sugar (glucose) after you have not eaten for a while  (fasting). You may have this done every 1-3 years. Bone density scan. This is done to screen for osteoporosis. You may have this done starting at age 63. Mammogram. This may be done every 1-2 years. Talk to your health care provider about how often you should have regular mammograms. Talk with your health care provider about your test results, treatment options, and if necessary, the need for more tests. Vaccines  Your health care provider may recommend certain vaccines, such as: Influenza vaccine. This is recommended every year. Tetanus, diphtheria, and acellular pertussis (Tdap, Td) vaccine. You may need a Td booster every 10 years. Zoster vaccine. You may need this after age 18. Pneumococcal 13-valent conjugate (PCV13) vaccine. One dose is recommended after age 63. Pneumococcal polysaccharide (PPSV23) vaccine. One dose is recommended after age 86. Talk to your health care provider about which screenings and vaccines you need and how often you need them. This information is not intended to replace advice given to you by your health care provider. Make sure you discuss any questions you have with your health care provider. Document Released: 10/12/2015 Document Revised: 06/04/2016 Document Reviewed: 07/17/2015 Elsevier Interactive Patient Education  2017 [place] Prevention in the Home Falls can cause injuries. They can happen to people of all ages. There are many things you can do to make your home safe and to help prevent falls. What can I do on the outside of my home? Regularly fix the edges of walkways and driveways and fix any cracks. Remove anything that might make you trip as you walk through a door, such as a raised step or threshold. Trim any bushes or trees on the path to your home. Use bright outdoor lighting. Clear any walking paths of anything that might make someone trip, such as rocks or tools. Regularly check to see if handrails are loose or broken. Make sure that  both sides of any steps have handrails. Any raised decks and porches should have guardrails on the edges. Have any leaves, snow, or ice cleared regularly. Use sand or salt on walking paths during winter. Clean up any spills in your garage right away. This includes oil or grease spills. What can I do in the bathroom? Use night lights. Install grab bars by the toilet and in the tub and shower. Do not use towel bars as grab bars. Use non-skid mats or decals in the tub or shower. If you need to sit down in the shower, use a plastic, non-slip stool. Keep the floor dry. Clean up any water that spills on the floor as soon as it happens. Remove soap buildup in the tub or shower regularly. Attach bath mats securely with double-sided non-slip rug tape. Do not have throw rugs and other things on the floor that can make you trip. What can I do in the bedroom? Use night lights. Make sure that you have a light by your bed that is easy to reach. Do not use any sheets or blankets that are too big for your bed. They should not hang down onto the floor. Have a firm chair that has side arms. You can use this  for support while you get dressed. Do not have throw rugs and other things on the floor that can make you trip. What can I do in the kitchen? Clean up any spills right away. Avoid walking on wet floors. Keep items that you use a lot in easy-to-reach places. If you need to reach something above you, use a strong step stool that has a grab bar. Keep electrical cords out of the way. Do not use floor polish or wax that makes floors slippery. If you must use wax, use non-skid floor wax. Do not have throw rugs and other things on the floor that can make you trip. What can I do with my stairs? Do not leave any items on the stairs. Make sure that there are handrails on both sides of the stairs and use them. Fix handrails that are broken or loose. Make sure that handrails are as long as the stairways. Check  any carpeting to make sure that it is firmly attached to the stairs. Fix any carpet that is loose or worn. Avoid having throw rugs at the top or bottom of the stairs. If you do have throw rugs, attach them to the floor with carpet tape. Make sure that you have a light switch at the top of the stairs and the bottom of the stairs. If you do not have them, ask someone to add them for you. What else can I do to help prevent falls? Wear shoes that: Do not have high heels. Have rubber bottoms. Are comfortable and fit you well. Are closed at the toe. Do not wear sandals. If you use a stepladder: Make sure that it is fully opened. Do not climb a closed stepladder. Make sure that both sides of the stepladder are locked into place. Ask someone to hold it for you, if possible. Clearly mark and make sure that you can see: Any grab bars or handrails. First and last steps. Where the edge of each step is. Use tools that help you move around (mobility aids) if they are needed. These include: Canes. Walkers. Scooters. Crutches. Turn on the lights when you go into a dark area. Replace any light bulbs as soon as they burn out. Set up your furniture so you have a clear path. Avoid moving your furniture around. If any of your floors are uneven, fix them. If there are any pets around you, be aware of where they are. Review your medicines with your doctor. Some medicines can make you feel dizzy. This can increase your chance of falling. Ask your doctor what other things that you can do to help prevent falls. This information is not intended to replace advice given to you by your health care provider. Make sure you discuss any questions you have with your health care provider. Document Released: 07/12/2009 Document Revised: 02/21/2016 Document Reviewed: 10/20/2014 Elsevier Interactive Patient Education  2017 [employer].

## 2022-12-11 NOTE — Progress Notes (Signed)
Subjective:   Brittany Myers is a 72 y.o. female who presents for Medicare Annual (Subsequent) preventive examination.  Review of Systems    No ROS.  Medicare Wellness Virtual Visit.  Visual/audio telehealth visit, UTA vital signs.   See social history for additional risk factors.   Cardiac Risk Factors include: advanced age (>15mn, >>60women)     Objective:    Today's Vitals   12/11/22 1046  Pulse: 86  Weight: 132 lb (59.9 kg)  Height: '5\' 4"'$  (1.626 m)   Body mass index is 22.66 kg/m.     12/11/2022   10:54 AM 12/06/2021    3:23 PM 12/05/2021    1:38 PM 11/27/2020    1:15 PM 06/21/2019   11:18 AM 03/05/2018    4:38 PM  Advanced Directives  Does Patient Have a Medical Advance Directive? Yes Yes Yes Yes No No  Type of [place]amed[place]of [place]ill[place] will [place] will Living will [place] will    Does patient want to make changes to medical advance directive? No - Patient declined   No - Patient declined  Yes (ED - Information included in AVS)  Copy of [place] Chart? No - copy requested No - copy requested  No - copy requested    Would patient like information on creating a medical advance directive?     Yes (ED - Information included in AVS)     Current Medications (verified) Outpatient Encounter Medications as of 12/11/2022  Medication Sig   acetaminophen (TYLENOL) 500 MG tablet Take 500 mg by mouth every 6 (six) hours as needed.   alendronate (FOSAMAX) 70 MG tablet Take 1 tablet (70 mg total) by mouth once a week. Take with a full glass of water on an empty stomach.   benzonatate (TESSALON) 200 MG capsule Take 1 capsule (200 mg total) by mouth 3 (three) times daily as needed for cough.   citalopram (CELEXA) 10 MG tablet Take 1 tablet (10 mg total) by mouth daily.   clobetasol cream (TEMOVATE) 0AB-123456789% Apply 1 application topically as needed.   EPINEPHrine 0.3 mg/0.3 mL IJ SOAJ  injection Inject 0.3 mLs (0.3 mg total) into the muscle as needed.   estradiol (ESTRACE) 0.1 MG/GM vaginal cream Place 1 Applicatorful vaginally 2 (two) times a week.    LORazepam (ATIVAN) 0.5 MG tablet Take 1 tablet (0.5 mg total) by mouth at bedtime as needed for anxiety or sleep. take 1 tablet by mouth every 8 hours if needed for anxiety   Multiple Minerals-Vitamins (CALCIUM CITRATE +) TABS Taking daily   Multiple Vitamin (MULTIVITAMIN) capsule Take 1 capsule by mouth daily.   No facility-administered encounter medications on file as of 12/11/2022.    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  Minimal aortic root calcification   HEMORRHOID SURGERY     LAPAROSCOPIC ENDOMETRIOSIS FULGURATION     5 surgeries; Dr DIrven [place] Gyn   POLYPECTOMY  09/30/1999   Dr.Sam [place]    TREADMILL ECHO STRESS TEST  05/26/2016   Electrically and echocardiographically negative stress echo.  No ischemia or infarction.  LOW RISK.  Baseline EF 55 to 60% with normal increase to 75-80% hyperdynamic function with exercise.  Normal recovery.  No wall motion abnormalities.   Family History  Problem Relation Age of Onset   Endometriosis Mother    Hyperlipidemia Mother    Osteopenia Mother    Aortic dissection Mother    Heart failure Father        heart attack @ 50 after CVA   Stroke Father 36       hemorrhagic  after MI   Diabetes Father    Tremor Father    Hyperlipidemia Brother    Liver cancer Maternal Aunt    Alzheimer's disease Maternal Grandmother    Heart  attack Maternal Grandfather 55   Colon cancer Paternal Grandmother    Colon polyps Neg Hx    Esophageal cancer Neg Hx    Rectal cancer Neg Hx    Stomach cancer Neg Hx    Social History   Socioeconomic History   Marital status: Married    Spouse name: Not on file   Number of children: 0   Years of education: Not on file   Highest education level: Bachelor's degree (e.g., BA, AB, BS)  Occupational History   Occupation: Human resources officer  Tobacco Use   Smoking status: Never   Smokeless tobacco: Never  Vaping Use   Vaping Use: Never used  Substance and Sexual Activity   Alcohol use: Yes    Comment:   3/ week   Drug use: No   Sexual activity: Yes  Other Topics Concern   Not on file  Social History Narrative   Heart healthy diet   Regular Exercise-yes (gym 3 times a week)   Lives with husband       Her mother recently died -> per patient her mother (age 40) was doing well but on the day of her death she noted feeling unusual with a discomfort in her throat and jaw and a headache.  She was taken to the hospital by EMS based on the dispatch recommendations.  The next time she talked to a physician she was talking to the ER physician who is telling her that she was on multiple pressors to keep her blood pressures up enough to keep her alive.  Without them, she would not survive.  She was then seen by what sounds like a vascular surgeon who indicated that he felt like given her advanced age that she would not survive the potential corrective surgery.  Therefore once her family was able to be present, they withdrew care.         Subsequently, her brother had a significant change in his demeanor and became quite distant and evasive with her.  They have subsequently had a essentially complete separation.  This has been a source of a lot of stress with her.  Especially after her mother passing she was looking forward to being close to her brother.      Caregiver for her  husband who was recently diagnosed with PMR-a significant stressor along with relatively recent diagnosis of A-fib.   Social Determinants of Health   Financial Resource Strain: Low Risk  (12/07/2022)   Overall Financial Resource Strain (CARDIA)    Difficulty of Paying Living Expenses: Not hard at all  Food Insecurity: No Food Insecurity (12/07/2022)  Hunger Vital Sign    Worried About Running Out of Food in the Last Year: Never true    Ran Out of Food in the Last Year: Never true  Transportation Needs: No Transportation Needs (12/07/2022)   PRAPARE - Hydrologist (Medical): No    Lack of Transportation (Non-Medical): No  Physical Activity: Insufficiently Active (12/07/2022)   Exercise Vital Sign    Days of Exercise per Week: 3 days    Minutes of Exercise per Session: 40 min  Stress: Stress Concern Present (12/07/2022)   [place]    Feeling of Stress : Rather much  Social Connections: Unknown (12/07/2022)   Social Connection and Isolation Panel [NHANES]    Frequency of Communication with Friends and Family: Twice a week    Frequency of Social Gatherings with Friends and Family: Once a week    Attends Religious Services: Not on Advertising copywriter or Organizations: Yes    Attends Music therapist: More than 4 times per year    Marital Status: Married    Tobacco Counseling Counseling given: Not Answered   Clinical Intake:  Pre-visit preparation completed: Yes        Diabetes: No  How often do you need to have someone help you when you read instructions, pamphlets, or other written materials from your doctor or pharmacy?: 1 - Never    Interpreter Needed?: No      Activities of Daily Living    12/07/2022    6:20 PM  In your present state of health, do you have any difficulty performing the following activities:  Hearing? 0  Vision? 0  Difficulty  concentrating or making decisions? 0  Walking or climbing stairs? 0  Dressing or bathing? 0  Doing errands, shopping? 0  Preparing Food and eating ? N  Using the Toilet? N  In the past six months, have you accidently leaked urine? Y  Comment Stress incontinence  Do you have problems with loss of bowel control? N  Managing your Medications? N  Managing your Finances? N  Housekeeping or managing your Housekeeping? N    Patient Care Team: Binnie Rail, MD as PCP - General (Internal Medicine) Leonie Man, MD as PCP - Cardiology (Cardiology) Marica Otter, OD as Consulting Physician (Optometry) Szabat, Darnelle Maffucci, [facility] (Inactive) as Pharmacist (Pharmacist)  Indicate any recent Medical Services you may have received from other than [facility] providers in the past year (date may be approximate).     Assessment:   This is a routine wellness examination for [place].  Patient Medicare AWV questionnaire was completed by the patient on 12/07/22 , I have confirmed that all information answered by patient is correct and no changes since this date.   I connected with  Khalise Bizzle [place]ville on 12/11/22 by a audio enabled telemedicine application and verified that I am speaking with the correct person using two identifiers.  Patient Location: Home  Provider Location: Office/Clinic  I discussed the limitations of evaluation and management by telemedicine. The patient expressed understanding and agreed to proceed.   Hearing/Vision screen Hearing Screening - Comments:: Able to hear conversational tones w/o difficulty. Vision Screening - Comments::  Annual eye exams, wears contacts  Dietary issues and exercise activities discussed: Current Exercise Habits: Home exercise routine, Type of exercise: treadmill;calisthenics;strength training/weights (PT), Time (Minutes): 40, Frequency (Times/Week): 3, Weekly Exercise (Minutes/Week): 120, Intensity: Mild Healthy diet Good  water intake    Goals Addressed              This Visit's Progress    Patient Stated   On track    Increase my physical activity by doing more yoga and get back to church as soon as possible.         Depression Screen    12/11/2022   10:36 AM 12/06/2021    3:25 PM 12/06/2021    3:22 PM 11/27/2020    1:13 PM 09/04/2020    3:01 PM 06/21/2019   11:23 AM 03/05/2018    4:45 PM  PHQ 2/9 Scores  PHQ - 2 Score 0 0 0 3 0 1 1  PHQ- 9 Score      4     Fall Risk    12/07/2022    6:20 PM 12/06/2021    3:24 PM 12/05/2021    1:37 PM 11/27/2020    1:16 PM 09/04/2020    3:00 PM  [place] in the past year? 0 0 0 0 0  Number falls in past yr: 0 0 0 0 0  Injury with Fall? 0 0 0 0 0  Risk for fall due to :  No Fall Risks  No Fall Risks No Fall Risks  Follow up Falls evaluation completed;Falls prevention discussed Falls evaluation completed;Education provided   Falls evaluation completed    FALL RISK PREVENTION PERTAINING TO THE HOME: Home free of loose throw rugs in walkways, pet beds, electrical cords, etc? Yes  Adequate lighting in your home to reduce risk of falls? Yes   ASSISTIVE DEVICES UTILIZED TO PREVENT FALLS: Life alert? No  Use of a cane, walker or w/c? No   TIMED UP AND GO: Was the test performed? No .   Cognitive Function:        12/11/2022   10:41 AM  6CIT Screen  What Year? 0 points  What month? 0 points  What time? 0 points  Months in reverse 0 points  Repeat phrase 0 points    Immunizations Immunization History  Administered Date(s) Administered   Fluad Quad(high Dose 65+) 06/21/2019, 07/19/2020, 07/10/2021, 07/08/2022   Influenza Split 07/31/2011, 07/14/2012   Influenza Whole 07/18/2008, 10/10/2009, 06/18/2010   Influenza, High Dose Seasonal PF 07/28/2016, 07/17/2017, 06/24/2018   Influenza,inj,Quad PF,6+ Mos 07/20/2013, 07/20/2014, 07/24/2015   Moderna SARS-COV2 Booster Vaccination 10/06/2020   Moderna Sars-Covid-2 Vaccination 11/11/2019, 12/07/2019   Pneumococcal Conjugate-13  12/05/2016   Pneumococcal Polysaccharide-23 03/05/2018   Tdap 11/25/2006, 01/16/2017   Zoster, Live 03/26/2016   Shingrix Completed?: No.    Education has been provided regarding the importance of this vaccine. Patient has been advised to call insurance company to determine out of pocket expense if they have not yet received this vaccine. Advised may also receive vaccine at local pharmacy or Health Dept. Verbalized acceptance and understanding.  Screening Tests Health Maintenance  Topic Date Due   DEXA SCAN  10/16/2021   COVID-19 Vaccine (4 - 2023-24 season) 12/27/2022 (Originally 05/30/2022)   Zoster Vaccines- Shingrix (1 of 2) 03/13/2023 (Originally 04/02/2001)   Medicare Annual Wellness (AWV)  12/11/2023   MAMMOGRAM  05/09/2024   DTaP/Tdap/Td (3 - Td or Tdap) 01/17/2027   Pneumonia Vaccine 19+ Years old  Completed   INFLUENZA VACCINE  Completed   Hepatitis C Screening  Completed   HPV VACCINES  Aged Out   COLONOSCOPY (Pts 45-57yr Insurance coverage will need to be confirmed)  [place]  Maintenance Health Maintenance Due  Topic Date Due   DEXA SCAN  10/16/2021   Lung Cancer Screening: (Low Dose CT Chest recommended if Age 36-80 years, 30 pack-year currently smoking OR have quit w/in 15years.) does not qualify.   Hepatitis C Screening: Completed 06/2015.   Vision Screening: Recommended annual ophthalmology exams for early detection of glaucoma and other disorders of the eye.  Dental Screening: Recommended annual dental exams for proper oral hygiene  Community Resource Referral / Chronic Care Management: CRR required this visit?  No   CCM required this visit?  No      Plan:     I have personally reviewed and noted the following in the patient's chart:   Medical and social history Use of alcohol, tobacco or illicit drugs  Current medications and supplements including opioid prescriptions. Patient is not currently taking opioid prescriptions. Functional  ability and status Nutritional status Physical activity Advanced directives List of other physicians Hospitalizations, surgeries, and ER visits in previous 12 months Vitals Screenings to include cognitive, depression, and falls Referrals and appointments  In addition, I have reviewed and discussed with patient certain preventive protocols, quality metrics, and best practice recommendations. A written personalized care plan for preventive services as well as general preventive health recommendations were provided to patient.     Leta Jungling, LPN   [id number]

## 2022-12-18 ENCOUNTER — Encounter: Payer: Self-pay | Admitting: Internal Medicine

## 2022-12-18 NOTE — Progress Notes (Signed)
Outside notes received. Information abstracted. Notes sent to scan.  

## 2022-12-25 DIAGNOSIS — D2272 Melanocytic nevi of left lower limb, including hip: Secondary | ICD-10-CM | POA: Diagnosis not present

## 2022-12-25 DIAGNOSIS — L218 Other seborrheic dermatitis: Secondary | ICD-10-CM | POA: Diagnosis not present

## 2022-12-25 DIAGNOSIS — D485 Neoplasm of uncertain behavior of skin: Secondary | ICD-10-CM | POA: Diagnosis not present

## 2022-12-25 DIAGNOSIS — D045 Carcinoma in situ of skin of trunk: Secondary | ICD-10-CM | POA: Diagnosis not present

## 2022-12-25 DIAGNOSIS — Z85828 Personal history of other malignant neoplasm of skin: Secondary | ICD-10-CM | POA: Diagnosis not present

## 2022-12-25 DIAGNOSIS — D225 Melanocytic nevi of trunk: Secondary | ICD-10-CM | POA: Diagnosis not present

## 2022-12-25 DIAGNOSIS — D2262 Melanocytic nevi of left upper limb, including shoulder: Secondary | ICD-10-CM | POA: Diagnosis not present

## 2022-12-25 DIAGNOSIS — D2271 Melanocytic nevi of right lower limb, including hip: Secondary | ICD-10-CM | POA: Diagnosis not present

## 2022-12-25 DIAGNOSIS — L812 Freckles: Secondary | ICD-10-CM | POA: Diagnosis not present

## 2022-12-25 DIAGNOSIS — L820 Inflamed seborrheic keratosis: Secondary | ICD-10-CM | POA: Diagnosis not present

## 2022-12-25 DIAGNOSIS — L821 Other seborrheic keratosis: Secondary | ICD-10-CM | POA: Diagnosis not present

## 2022-12-31 ENCOUNTER — Other Ambulatory Visit: Payer: Self-pay

## 2022-12-31 ENCOUNTER — Ambulatory Visit: Payer: Medicare Other | Admitting: Family Medicine

## 2022-12-31 ENCOUNTER — Encounter: Payer: Self-pay | Admitting: Family Medicine

## 2022-12-31 VITALS — BP 100/68 | Ht 64.0 in | Wt 132.0 lb

## 2022-12-31 DIAGNOSIS — M23203 Derangement of unspecified medial meniscus due to old tear or injury, right knee: Secondary | ICD-10-CM

## 2022-12-31 DIAGNOSIS — M7742 Metatarsalgia, left foot: Secondary | ICD-10-CM | POA: Diagnosis not present

## 2022-12-31 DIAGNOSIS — M7741 Metatarsalgia, right foot: Secondary | ICD-10-CM | POA: Diagnosis not present

## 2022-12-31 MED ORDER — PREDNISONE 5 MG PO TABS: ORAL_TABLET | ORAL | 0 refills | Status: DC

## 2022-12-31 NOTE — Assessment & Plan Note (Signed)
Acutely occurring with effusion and acute changes of the meniscus. -Counseled on home exercise therapy and supportive care. -Prednisone. -Could consider PRP.

## 2022-12-31 NOTE — Assessment & Plan Note (Signed)
Acutely occurring with no changes of the plantar fascia.  Symptoms seem more consistent with metatarsalgia with loss of the transverse arch. -Counseled on home exercise therapy and supportive care. -Added scaphoid pad and metatarsal pads to her current orthotics.

## 2022-12-31 NOTE — Progress Notes (Signed)
  Brittany Myers - 72 y.o. female MRN [id number]  Date of birth: 23-Dec-1950  SUBJECTIVE:  Including CC & ROS.  No chief complaint on file.   Brittany Myers is a 72 y.o. female that is presenting with acute right knee pain and bilateral foot pain.  The knee pain is occurring over the medial aspect.  She denies any injury or inciting event.  The foot pain is worse towards the end of the day.  It is occurring in the plantar aspect of each foot.    Review of Systems See HPI   HISTORY: Past Medical, Surgical, Social, and Family History Reviewed & Updated per EMR.   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  Minimal aortic root calcification   HEMORRHOID SURGERY     LAPAROSCOPIC ENDOMETRIOSIS FULGURATION     5 surgeries; Dr Irven [place], Gyn   POLYPECTOMY  09/30/1999   Dr.Sam     TREADMILL ECHO STRESS TEST  05/26/2016   Electrically and echocardiographically negative stress echo.  No ischemia or infarction.  LOW RISK.  Baseline EF 55 to 60% with normal increase to 75-80% hyperdynamic function with exercise.  Normal recovery.  No wall motion abnormalities.     PHYSICAL EXAM:  VS: BP 100/68 (BP Location: Left Arm, Patient Position: Sitting)   Ht 5\' 4"  (1.626 m)   Wt 132 lb (59.9 kg)   BMI 22.66 kg/m  Physical Exam Gen: NAD, alert, cooperative with exam, well-appearing MSK:  Neurovascularly intact    Limited ultrasound: Right knee pain, left foot pain, right foot pain:  Right knee: Moderate effusion of the suprapatellar pouch. Normal-appearing quadricep and patellar tendon. Outpouching of the medial meniscus with  increased hyperemia  Right and left foot: No significant thickening of the plantar fascia  Summary: Findings consistent with right knee effusion and degenerative change of the meniscus.  Ultrasound and interpretation by Clearance Coots, MD    ASSESSMENT & PLAN:   Degenerative tear of medial meniscus of right knee Acutely occurring with effusion and acute changes of the meniscus. -Counseled on home exercise therapy and supportive care. -Prednisone. -Could consider PRP.   Metatarsalgia of both feet Acutely occurring with no changes of the plantar fascia.  Symptoms seem more consistent with metatarsalgia with loss of the transverse arch. -Counseled on home exercise therapy and supportive care. -Added scaphoid pad and metatarsal pads to her current orthotics.

## 2023-01-12 ENCOUNTER — Encounter: Payer: Self-pay | Admitting: *Deleted

## 2023-04-01 ENCOUNTER — Encounter: Payer: Self-pay | Admitting: Internal Medicine

## 2023-04-01 ENCOUNTER — Ambulatory Visit: Payer: Medicare Other | Admitting: Internal Medicine

## 2023-04-01 VITALS — BP 106/64 | HR 67 | Temp 98.4°F | Ht 64.0 in | Wt 132.0 lb

## 2023-04-01 DIAGNOSIS — R5383 Other fatigue: Secondary | ICD-10-CM

## 2023-04-01 DIAGNOSIS — G479 Sleep disorder, unspecified: Secondary | ICD-10-CM

## 2023-04-01 MED ORDER — TRAZODONE HCL 50 MG PO TABS: 50.0000 mg | ORAL_TABLET | Freq: Every day | ORAL | 5 refills | Status: DC

## 2023-04-01 NOTE — Patient Instructions (Addendum)
      Blood work was ordered.   The lab is on the first floor.    Medications changes include :   trazodone 50-100 mg nightly      Return if symptoms worsen or fail to improve.

## 2023-04-01 NOTE — Progress Notes (Signed)
Subjective:    Patient ID: Brittany Myers, female    DOB: Sep 28, 1951, 72 y.o.   MRN: [id number]      HPI Soriya is here for  Chief Complaint  Patient presents with   Fatigue    Fatigue for past couple months      Fatigue-  started 4-5 weeks ago.  She denies any other specific symptoms that she noticed at that time.  She was unsure if it was related to her poor sleep which is not new, the heat, getting older or other things.  She was concerned about something more serious causing it which is why she wanted to come in.  Can nap in afternoon can fall asleep for 2 hrs. she never used to nap.  If she did not nap in the past used to be a 15-minute power nap and she would feel great, but now she lays down she finds herself asleep for 2 hours.  She tries not to do that, but once in a while she cannot find it.  Gets 5-7 hrs of sleep - interrupted.  At 2 or 3:00 in the morning every night she just wakes up and cannot get back to sleep.  She does try to read and often will get back to sleep, but not not for long.  Gym 3/week.  She stopped drinking a glass of wine at night because she noticed that can affect her sleep.  Her bad sleep started ever since menopause.  Medications and allergies reviewed with patient and updated if appropriate.  Current Outpatient Medications on File Prior to Visit  Medication Sig Dispense Refill   alendronate (FOSAMAX) 70 MG tablet Take 1 tablet (70 mg total) by mouth once a week. Take with a full glass of water on an empty stomach. 12 tablet 3   clobetasol cream (TEMOVATE) 0.05 % Apply 1 application topically as needed.     EPINEPHrine 0.3 mg/0.3 mL IJ SOAJ injection Inject 0.3 mLs (0.3 mg total) into the muscle as needed. 2 each 0   estradiol (ESTRACE) 0.1 MG/GM vaginal cream Place 1 Applicatorful vaginally 2 (two) times a week.      LORazepam (ATIVAN) 0.5 MG tablet Take 1 tablet (0.5 mg total) by mouth at bedtime as needed for anxiety or sleep. take 1 tablet  by mouth every 8 hours if needed for anxiety 30 tablet 2   Multiple Minerals-Vitamins (CALCIUM CITRATE +) TABS Taking daily  0   Multiple Vitamin (MULTIVITAMIN) capsule Take 1 capsule by mouth daily.     No current facility-administered medications on file prior to visit.    Review of Systems  Constitutional:  Positive for fatigue. Negative for appetite change and fever.  Respiratory:  Negative for cough, shortness of breath and wheezing.   Cardiovascular:  Negative for chest pain, palpitations and leg swelling.  Gastrointestinal:  Negative for abdominal pain, constipation and diarrhea.  Musculoskeletal:  Positive for arthralgias (mild OA - R thumb, right knee, plantar fasciitis). Negative for myalgias.  Neurological:  Positive for dizziness (occ). Negative for light-headedness and headaches.  Psychiatric/Behavioral:  Negative for dysphoric mood. The patient is not nervous/anxious.        Objective:   Vitals:   04/01/23 1534  BP: 106/64  Pulse: 67  Temp: 98.4 F (36.9 C)  SpO2: 97%   BP Readings from Last 3 Encounters:  04/01/23 106/64  12/31/22 100/68  07/09/22 126/70   Wt Readings from Last 3 Encounters:  04/01/23 132 lb (59.9  kg)  12/31/22 132 lb (59.9 kg)  12/11/22 132 lb (59.9 kg)   Body mass index is 22.66 kg/m.    Physical Exam Constitutional:      General: She is not in acute distress.    Appearance: Normal appearance.  HENT:     Head: Normocephalic and atraumatic.  Eyes:     Conjunctiva/sclera: Conjunctivae normal.  Cardiovascular:     Rate and Rhythm: Normal rate and regular rhythm.     Heart sounds: Normal heart sounds.  Pulmonary:     Effort: Pulmonary effort is normal. No respiratory distress.     Breath sounds: Normal breath sounds. No wheezing.  Musculoskeletal:     Cervical back: Neck supple.     Right lower leg: No edema.     Left lower leg: No edema.  Lymphadenopathy:     Cervical: No cervical adenopathy.  Skin:    General: Skin is warm  and dry.     Findings: No rash.  Neurological:     Mental Status: She is alert. Mental status is at baseline.  Psychiatric:        Mood and Affect: Mood normal.        Behavior: Behavior normal.            Assessment & Plan:    See Problem List for Assessment and Plan of chronic medical problems.

## 2023-04-01 NOTE — Assessment & Plan Note (Signed)
Chronic Started at the time of menopause Typically falls asleep okay, but wakes up at 2-3 and is up for a little while before going back to sleep She exercises regularly, avoids alcohol May have increased stress which could be contributing, but this likely more hormonal Has failed melatonin, Benadryl and NyQuil Did try trazodone at one point we think, but not sure if she actually took it for sleep-not sure if this worked or not so we will retry Start trazodone 50-100 mg nightly If the above does not work can consider either taking the lorazepam more regularly, but not nightly for trying Brittany Myers We discussed risk versus benefits and unfortunately now her lack of sleep is significantly affecting her quality of life so it may be worth taking medication more regularly-this may not be nightly She will let me know if the trazodone works and we can go from there

## 2023-04-01 NOTE — Assessment & Plan Note (Signed)
Subacute Started 4-5 months ago Discussed all the possible causes I do think her sleep is probably contributing, but her sleep has been going on for a long time so it is probably not the factor No other concerning symptoms to think something more serious is going on Will check CBC, CMP and TSH She is exercising regularly, eating healthy Will work on sleep issues to see if that helps

## 2023-04-02 LAB — CBC WITH DIFFERENTIAL/PLATELET
Absolute Monocytes: 566 {cells}/uL (ref 200–950)
Basophils Absolute: 39 {cells}/uL (ref 0–200)
Basophils Relative: 0.7 %
Eosinophils Absolute: 190 {cells}/uL (ref 15–500)
Eosinophils Relative: 3.4 %
HCT: 44.2 % (ref 35.0–45.0)
Hemoglobin: 15.2 g/dL (ref 11.7–15.5)
Lymphs Abs: 1714 {cells}/uL (ref 850–3900)
MCH: 32 pg (ref 27.0–33.0)
MCHC: 34.4 g/dL (ref 32.0–36.0)
MCV: 93.1 fL (ref 80.0–100.0)
MPV: 10.9 fL (ref 7.5–12.5)
Monocytes Relative: 10.1 %
Neutro Abs: 3091 {cells}/uL (ref 1500–7800)
Neutrophils Relative %: 55.2 %
Platelets: 239 Thousand/uL (ref 140–400)
RBC: 4.75 Million/uL (ref 3.80–5.10)
RDW: 11.8 % (ref 11.0–15.0)
Total Lymphocyte: 30.6 %
WBC: 5.6 Thousand/uL (ref 3.8–10.8)

## 2023-04-02 LAB — COMPREHENSIVE METABOLIC PANEL WITH GFR
AG Ratio: 2 (calc) (ref 1.0–2.5)
ALT: 17 U/L (ref 6–29)
AST: 20 U/L (ref 10–35)
Albumin: 4.3 g/dL (ref 3.6–5.1)
Alkaline phosphatase (APISO): 63 U/L (ref 37–153)
BUN: 18 mg/dL (ref 7–25)
CO2: 23 mmol/L (ref 20–32)
Calcium: 9.6 mg/dL (ref 8.6–10.4)
Chloride: 106 mmol/L (ref 98–110)
Creat: 0.76 mg/dL (ref 0.60–1.00)
Globulin: 2.2 g/dL (ref 1.9–3.7)
Glucose, Bld: 100 mg/dL — ABNORMAL HIGH (ref 65–99)
Potassium: 4.4 mmol/L (ref 3.5–5.3)
Sodium: 140 mmol/L (ref 135–146)
Total Bilirubin: 0.4 mg/dL (ref 0.2–1.2)
Total Protein: 6.5 g/dL (ref 6.1–8.1)

## 2023-04-02 LAB — TSH: TSH: 2.32 mIU/L (ref 0.40–4.50)

## 2023-04-03 ENCOUNTER — Encounter: Payer: Self-pay | Admitting: Internal Medicine

## 2023-04-20 DIAGNOSIS — L820 Inflamed seborrheic keratosis: Secondary | ICD-10-CM | POA: Diagnosis not present

## 2023-04-20 DIAGNOSIS — Z85828 Personal history of other malignant neoplasm of skin: Secondary | ICD-10-CM | POA: Diagnosis not present

## 2023-04-20 DIAGNOSIS — L821 Other seborrheic keratosis: Secondary | ICD-10-CM | POA: Diagnosis not present

## 2023-04-20 DIAGNOSIS — L570 Actinic keratosis: Secondary | ICD-10-CM | POA: Diagnosis not present

## 2023-04-24 ENCOUNTER — Other Ambulatory Visit: Payer: Self-pay | Admitting: Internal Medicine

## 2023-04-27 ENCOUNTER — Ambulatory Visit: Payer: Medicare Other | Admitting: Family Medicine

## 2023-04-27 ENCOUNTER — Ambulatory Visit (INDEPENDENT_AMBULATORY_CARE_PROVIDER_SITE_OTHER): Payer: Medicare Other

## 2023-04-27 ENCOUNTER — Other Ambulatory Visit: Payer: Self-pay

## 2023-04-27 VITALS — BP 102/70 | HR 62 | Ht 64.0 in | Wt 132.0 lb

## 2023-04-27 DIAGNOSIS — M25561 Pain in right knee: Secondary | ICD-10-CM

## 2023-04-27 DIAGNOSIS — M23203 Derangement of unspecified medial meniscus due to old tear or injury, right knee: Secondary | ICD-10-CM

## 2023-04-27 DIAGNOSIS — H53143 Visual discomfort, bilateral: Secondary | ICD-10-CM | POA: Diagnosis not present

## 2023-04-27 DIAGNOSIS — H2513 Age-related nuclear cataract, bilateral: Secondary | ICD-10-CM | POA: Diagnosis not present

## 2023-04-27 DIAGNOSIS — G8929 Other chronic pain: Secondary | ICD-10-CM

## 2023-04-27 DIAGNOSIS — S83231A Complex tear of medial meniscus, current injury, right knee, initial encounter: Secondary | ICD-10-CM | POA: Diagnosis not present

## 2023-04-27 NOTE — Progress Notes (Signed)
    I, Stevenson Clinch, CMA acting as a scribe for Clementeen Graham, MD.  Brittany Myers is a 72 y.o. female who presents to [facility] at [facility] today for pain in right knee. Patient has been seeing Dr. Jordan Likes for this reason and he was doing all the things to have her not to have surgery. Patient is having trouble in yoga a full flexion without pain. Patient has been doing her HEP religiously and she does usually do the gym twice a week but has not been able to go for the last couple weeks to let it rest but is still having pain sometimes when getting out of a chair it will pop.    Pertinent review of systems: No fevers or chills  Relevant historical information: Metatarsalgia   Exam:  BP 102/70   Pulse 62   Ht 5\' 4"  (1.626 m)   Wt 132 lb (59.9 kg)   SpO2 97%   BMI 22.66 kg/m  General: Well Developed, well nourished, and in no acute distress.   MSK: Right knee normal-appearing Normal knee motion. Tender palpation medial joint line. Stable ligamentous exam. Intact strength. Positive medial McMurray's test.    Lab and Radiology Results  Diagnostic Limited MSK Ultrasound of: Right knee Quad tendon intact normal. Patellar tendon normal-appearing. Medial joint line narrowed degenerative medial meniscus partially extruded. Lateral joint line normal appearing Posterior knee no Baker's cyst. 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  X-ray images right knee obtained today personally and independently interpreted Minimal medial DJD.  No acute fractures are visible. Await formal radiology review   Assessment and Plan: 72 y.o. female with right medial knee pain.  This is a chronic issue that has occurred waxing and waning for several months.  This was previously managed by my colleague Dr. Jordan Likes. Pain thought to be due to degenerative medial meniscus tear.  We discussed options.  Plan on Voltaren gel and continue quad  strengthening exercises.  If needed steroid injection could be used in the future.  We also could refer to PT.  She will let me know how she feels.   PDMP not reviewed this encounter. Orders Placed This Encounter  Procedures   DG Knee AP/LAT W/Sunrise Right    Standing Status:   Future    Number of Occurrences:   1    Standing Expiration Date:   04/26/2024    Order Specific Question:   Reason for Exam (SYMPTOM  OR DIAGNOSIS REQUIRED)    Answer:   right knee pain    Order Specific Question:   Preferred imaging location?    Answer:   [facility]   [place] LIMITED JOINT SPACE STRUCTURES LOW RIGHT(NO LINKED CHARGES)    Standing Status:   Future    Number of Occurrences:   1    Standing Expiration Date:   04/26/2024    Order Specific Question:   Reason for Exam (SYMPTOM  OR DIAGNOSIS REQUIRED)    Answer:   right knee pain    Order Specific Question:   Preferred imaging location?    Answer:   [facility]   No orders of the defined types were placed in this encounter.    Discussed warning signs or symptoms. Please see discharge instructions. Patient expresses understanding.   The above documentation has been reviewed and is accurate and complete Clementeen Graham, M.D.

## 2023-04-27 NOTE — Patient Instructions (Signed)
Thank you for coming in today.   We could add PT whenever. Just let me know. Remind me about [place].   Please get an Xray today before you leave   Please use Voltaren gel (Generic Diclofenac Gel) up to 4x daily for pain as needed.  This is available over-the-counter as both the name brand Voltaren gel and the generic diclofenac gel.   We can do an injection any time.   If you want to do the gel shots let me know ahead of time.

## 2023-05-04 NOTE — Progress Notes (Signed)
Right knee x-ray shows a little bit of spurring or arthritis

## 2023-05-12 ENCOUNTER — Encounter: Payer: Self-pay | Admitting: Family Medicine

## 2023-05-12 ENCOUNTER — Encounter: Payer: Self-pay | Admitting: Internal Medicine

## 2023-05-12 DIAGNOSIS — S99929A Unspecified injury of unspecified foot, initial encounter: Secondary | ICD-10-CM

## 2023-05-14 DIAGNOSIS — Z1231 Encounter for screening mammogram for malignant neoplasm of breast: Secondary | ICD-10-CM | POA: Diagnosis not present

## 2023-05-14 LAB — HM MAMMOGRAPHY

## 2023-05-15 ENCOUNTER — Encounter: Payer: Self-pay | Admitting: Internal Medicine

## 2023-05-15 ENCOUNTER — Ambulatory Visit: Payer: Medicare Other | Admitting: Podiatry

## 2023-05-15 DIAGNOSIS — L601 Onycholysis: Secondary | ICD-10-CM | POA: Diagnosis not present

## 2023-05-15 DIAGNOSIS — L603 Nail dystrophy: Secondary | ICD-10-CM | POA: Diagnosis not present

## 2023-05-15 DIAGNOSIS — M722 Plantar fascial fibromatosis: Secondary | ICD-10-CM

## 2023-05-15 MED ORDER — MUPIROCIN 2 % EX OINT: 1.0000 | TOPICAL_OINTMENT | Freq: Two times a day (BID) | CUTANEOUS | 2 refills | Status: DC

## 2023-05-15 NOTE — Patient Instructions (Signed)

## 2023-05-15 NOTE — Progress Notes (Unsigned)
Subjective:   Patient ID: Brittany Myers, female   DOB: 72 y.o.   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

## 2023-05-21 ENCOUNTER — Ambulatory Visit: Payer: Medicare Other | Admitting: Podiatry

## 2023-06-30 DIAGNOSIS — L821 Other seborrheic keratosis: Secondary | ICD-10-CM | POA: Diagnosis not present

## 2023-06-30 DIAGNOSIS — D0472 Carcinoma in situ of skin of left lower limb, including hip: Secondary | ICD-10-CM | POA: Diagnosis not present

## 2023-06-30 DIAGNOSIS — L820 Inflamed seborrheic keratosis: Secondary | ICD-10-CM | POA: Diagnosis not present

## 2023-06-30 DIAGNOSIS — Z85828 Personal history of other malignant neoplasm of skin: Secondary | ICD-10-CM | POA: Diagnosis not present

## 2023-06-30 DIAGNOSIS — D485 Neoplasm of uncertain behavior of skin: Secondary | ICD-10-CM | POA: Diagnosis not present

## 2023-06-30 DIAGNOSIS — D1801 Hemangioma of skin and subcutaneous tissue: Secondary | ICD-10-CM | POA: Diagnosis not present

## 2023-06-30 DIAGNOSIS — D225 Melanocytic nevi of trunk: Secondary | ICD-10-CM | POA: Diagnosis not present

## 2023-07-07 ENCOUNTER — Encounter: Payer: Self-pay | Admitting: Internal Medicine

## 2023-07-09 ENCOUNTER — Ambulatory Visit: Payer: Medicare Other

## 2023-07-09 DIAGNOSIS — Z23 Encounter for immunization: Secondary | ICD-10-CM

## 2023-07-09 NOTE — Progress Notes (Signed)
Patient here fo HD flu shot. Patient tolerated well with no complications

## 2023-07-12 ENCOUNTER — Encounter: Payer: Self-pay | Admitting: Internal Medicine

## 2023-07-12 NOTE — Patient Instructions (Addendum)
      Blood work was ordered.   The lab is on the first floor.    Medications changes include :       A referral was ordered and someone will call you to schedule an appointment.     Return in about 1 year (around 07/12/2024) for Physical Exam.

## 2023-07-12 NOTE — Progress Notes (Unsigned)
      Subjective:    Patient ID: Brittany Myers, female    DOB: June 21, 1951, 72 y.o.   MRN: [id number]     HPI Chane is here for follow up of her chronic medical problems.  Still difficulty sleeping.  Sometimes feels like she has memory issues - often trying to multi-task.    Medications and allergies reviewed with patient and updated if appropriate.  Current Outpatient Medications on File Prior to Visit  Medication Sig Dispense Refill   alendronate (FOSAMAX) 70 MG tablet Take 1 tablet (70 mg total) by mouth once a week. Take with a full glass of water on an empty stomach. 12 tablet 3   clobetasol cream (TEMOVATE) 0.05 % Apply 1 application topically as needed.     EPINEPHrine 0.3 mg/0.3 mL IJ SOAJ injection Inject 0.3 mLs (0.3 mg total) into the muscle as needed. 2 each 0   Multiple Minerals-Vitamins (CALCIUM CITRATE +) TABS Taking daily  0   Multiple Vitamin (MULTIVITAMIN) capsule Take 1 capsule by mouth daily.     mupirocin ointment (BACTROBAN) 2 % Apply 1 Application topically 2 (two) times daily. 30 g 2   No current facility-administered medications on file prior to visit.     Review of Systems  Psychiatric/Behavioral:  Positive for sleep disturbance. Negative for dysphoric mood. The patient is nervous/anxious (worries a lot).        Objective:   Vitals:   07/13/23 1446  BP: 116/74  Pulse: 71  Temp: 98 F (36.7 C)  SpO2: 96%   BP Readings from Last 3 Encounters:  07/13/23 116/74  04/27/23 102/70  04/01/23 106/64   Wt Readings from Last 3 Encounters:  07/13/23 130 lb (59 kg)  04/27/23 132 lb (59.9 kg)  04/01/23 132 lb (59.9 kg)   Body mass index is 22.31 kg/m.    Physical Exam Constitutional:      General: She is not in acute distress.    Appearance: Normal appearance. She is not ill-appearing.  HENT:     Head: Normocephalic and atraumatic.  Skin:    General: Skin is warm and dry.  Neurological:     Mental Status: She is alert. Mental status is  at baseline.  Psychiatric:        Mood and Affect: Mood normal.        Behavior: Behavior normal.        Thought Content: Thought content normal.        Judgment: Judgment normal.        Lab Results  Component Value Date   WBC 5.6 04/01/2023   HGB 15.2 04/01/2023   HCT 44.2 04/01/2023   PLT 239 04/01/2023   GLUCOSE 100 (H) 04/01/2023   CHOL 187 12/19/2020   TRIG 74.0 12/19/2020   HDL 64.40 12/19/2020   LDLDIRECT 116.2 05/20/2012   LDLCALC 108 (H) 12/19/2020   ALT 17 04/01/2023   AST 20 04/01/2023   NA 140 04/01/2023   K 4.4 04/01/2023   CL 106 04/01/2023   CREATININE 0.76 04/01/2023   BUN 18 04/01/2023   CO2 23 04/01/2023   TSH 2.32 04/01/2023   HGBA1C 5.5 05/21/2018     Assessment & Plan:    See Problem List for Assessment and Plan of chronic medical problems.

## 2023-07-13 ENCOUNTER — Ambulatory Visit (INDEPENDENT_AMBULATORY_CARE_PROVIDER_SITE_OTHER): Payer: Medicare Other | Admitting: Internal Medicine

## 2023-07-13 VITALS — BP 116/74 | HR 71 | Temp 98.0°F | Ht 64.0 in | Wt 130.0 lb

## 2023-07-13 DIAGNOSIS — F419 Anxiety disorder, unspecified: Secondary | ICD-10-CM | POA: Diagnosis not present

## 2023-07-13 DIAGNOSIS — G479 Sleep disorder, unspecified: Secondary | ICD-10-CM | POA: Diagnosis not present

## 2023-07-13 DIAGNOSIS — R739 Hyperglycemia, unspecified: Secondary | ICD-10-CM | POA: Diagnosis not present

## 2023-07-13 DIAGNOSIS — M810 Age-related osteoporosis without current pathological fracture: Secondary | ICD-10-CM

## 2023-07-13 MED ORDER — ESTRADIOL 0.1 MG/GM VA CREA: 1.0000 | TOPICAL_CREAM | VAGINAL | 11 refills | Status: AC

## 2023-07-13 MED ORDER — LORAZEPAM 0.5 MG PO TABS: 0.5000 mg | ORAL_TABLET | Freq: Every evening | ORAL | 5 refills | Status: DC | PRN

## 2023-07-13 NOTE — Assessment & Plan Note (Addendum)
Chronic  Worries a lot  Continue regular exercise Working on stress/anxiety Continue lorazepam 0.5 mg 2-3 times a week Continue estrace 2 gm twice weekly which also helped her with sleep

## 2023-07-13 NOTE — Assessment & Plan Note (Signed)
Chronic Will check A1c and random insulin with her next blood work Continue regular exercise Continue healthy diet - low in sugars

## 2023-07-13 NOTE — Assessment & Plan Note (Signed)
Chronic Intermittent - worries a lot - affects her sleep Continue ativan 0.5 mg 2-3 times a week - will avoid taking it nightly

## 2023-09-17 ENCOUNTER — Other Ambulatory Visit: Payer: Self-pay | Admitting: Internal Medicine

## 2023-10-15 DIAGNOSIS — L820 Inflamed seborrheic keratosis: Secondary | ICD-10-CM | POA: Diagnosis not present

## 2023-10-15 DIAGNOSIS — L570 Actinic keratosis: Secondary | ICD-10-CM | POA: Diagnosis not present

## 2023-11-13 NOTE — Progress Notes (Signed)
   I, Stevenson Clinch, CMA acting as a scribe for Clementeen Graham, MD.  Brittany Myers is a 73 y.o. female who presents to [facility] at [facility] today for cont'd R knee. Pt was last seen by Dr. Denyse Amass on 04/27/23 and was advised to use Voltaren gel and cont quad strengthening.  Today, pt reports continued right knee pain since 09/2020. Would like to discuss MRI today. Has done PT in the past. Has also has fluid drained in the past. Locates pain to lateral aspect of the knee radiating into the upper and lower leg. Sx wax and wane.   Dx imaging: 04/27/23 R knee XR  Pertinent review of systems: No fevers or chills  Relevant historical information: History of a foot fracture on the left years ago.   Exam:  BP 104/78   Pulse 91   Ht 5\' 4"  (1.626 m)   Wt 131 lb (59.4 kg)   SpO2 97%   BMI 22.49 kg/m  General: Well Developed, well nourished, and in no acute distress.   MSK: Right knee mild effusion normal motion.  Intact strength.    Lab and Radiology Results  EXAM: RIGHT KNEE 3 VIEWS   COMPARISON:  None Available.   FINDINGS: No evidence of fracture, dislocation, or joint effusion. The alignment and joint spaces are normal. There is trace peripheral spurring. No erosion or focal bone abnormality. Soft tissues are unremarkable.   IMPRESSION: Trace degenerative peripheral spurring.     Electronically Signed   By: Narda Rutherford M.D.   On: 05/03/2023 19:56 I, Clementeen Graham, personally (independently) visualized and performed the interpretation of the images attached in this note.     Assessment and Plan: 73 y.o. female with chronic right knee pain.  This has been ongoing for years.  I saw her for this most recently in July 2024.  She has been working on home exercise program directed by me and prior physicians for this without much benefit.  Plan for MRI to further characterize source of pain and for potential surgical or injection planning.   PDMP not  reviewed this encounter. Orders Placed This Encounter  Procedures   MR KNEE RIGHT WO CONTRAST    Standing Status:   Future    Expiration Date:   12/14/2023    What is the patient's sedation requirement?:   No Sedation    Does the patient have a pacemaker or implanted devices?:   No    Preferred imaging location?:   [facility] (table limit-550lbs)   No orders of the defined types were placed in this encounter.    Discussed warning signs or symptoms. Please see discharge instructions. Patient expresses understanding.   The above documentation has been reviewed and is accurate and complete Clementeen Graham, M.D.

## 2023-11-16 ENCOUNTER — Encounter: Payer: Self-pay | Admitting: Family Medicine

## 2023-11-16 ENCOUNTER — Ambulatory Visit: Payer: Medicare Other | Admitting: Family Medicine

## 2023-11-16 VITALS — BP 104/78 | HR 91 | Ht 64.0 in | Wt 131.0 lb

## 2023-11-16 DIAGNOSIS — M25561 Pain in right knee: Secondary | ICD-10-CM | POA: Diagnosis not present

## 2023-11-16 DIAGNOSIS — G8929 Other chronic pain: Secondary | ICD-10-CM

## 2023-11-16 NOTE — Patient Instructions (Addendum)
 Thank you for coming in today.  You should hear from MRI scheduling within 1 week. If you do not hear please let me know.    Based on the results we could PT, injection or even surgery.

## 2023-11-25 ENCOUNTER — Encounter: Payer: Self-pay | Admitting: Internal Medicine

## 2023-11-25 ENCOUNTER — Ambulatory Visit (INDEPENDENT_AMBULATORY_CARE_PROVIDER_SITE_OTHER): Payer: Medicare Other

## 2023-11-25 VITALS — BP 120/62 | HR 81 | Ht 64.0 in | Wt 132.6 lb

## 2023-11-25 DIAGNOSIS — Z0000 Encounter for general adult medical examination without abnormal findings: Secondary | ICD-10-CM

## 2023-11-25 NOTE — Progress Notes (Signed)
 Subjective:   Brittany Myers is a 73 y.o. who presents for a Medicare Wellness preventive visit.  Visit Complete: In person  AWV Questionnaire: No: Patient Medicare AWV questionnaire was not completed prior to this visit.  Cardiac Risk Factors include: advanced age (>60men, >71 women);family history of premature cardiovascular disease     Objective:    Today's Vitals   11/25/23 1015  BP: 120/62  Pulse: 81  SpO2: 97%  Weight: 132 lb 9.6 oz (60.1 kg)  Height: 5\' 4"  (1.626 m)   Body mass index is 22.76 kg/m.     11/25/2023   10:12 AM 12/11/2022   10:54 AM 12/06/2021    3:23 PM 12/05/2021    1:38 PM 11/27/2020    1:15 PM 06/21/2019   11:18 AM 03/05/2018    4:38 PM  Advanced Directives  Does Patient Have a Medical Advance Directive? Yes Yes Yes Yes Yes No No  Type of Estate agent of [place];Living will Healthcare Power of [place];Living will Healthcare Power of [place];Living will Living will Healthcare Power of [place];Living will    Does patient want to make changes to medical advance directive?  No - Patient declined   No - Patient declined  Yes (ED - Information included in AVS)  Copy of Healthcare Power of Attorney in Chart? No - copy requested No - copy requested No - copy requested  No - copy requested    Would patient like information on creating a medical advance directive?      Yes (ED - Information included in AVS)   Provided pt Advance Directive forms to update.  Current Medications (verified) Outpatient Encounter Medications as of 11/25/2023  Medication Sig   alendronate (FOSAMAX) 70 MG tablet Take 1 tablet (70 mg total) by mouth once a week. Take with a full glass of water on an empty stomach.   EPINEPHrine 0.3 mg/0.3 mL IJ SOAJ injection Inject 0.3 mLs (0.3 mg total) into the muscle as needed.   estradiol (ESTRACE) 0.1 MG/GM vaginal cream Place 1 Applicatorful vaginally 2 (two) times a week.   LORazepam (ATIVAN) 0.5 MG tablet Take 1 tablet  (0.5 mg total) by mouth at bedtime as needed for anxiety or sleep. take 1 tablet by mouth every 8 hours if needed for anxiety   Multiple Minerals-Vitamins (CALCIUM CITRATE +) TABS Taking daily   Multiple Vitamin (MULTIVITAMIN) capsule Take 1 capsule by mouth daily.   No facility-administered encounter medications on file as of 11/25/2023.    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  Minimal aortic root calcification   HEMORRHOID SURGERY     LAPAROSCOPIC ENDOMETRIOSIS FULGURATION     5 surgeries; Dr Rosalio Macadamia, Gyn   POLYPECTOMY  09/30/1999   Dr.Sam Herbst    TREADMILL ECHO STRESS TEST  05/26/2016   Electrically and echocardiographically negative stress echo.  No ischemia or infarction.  LOW RISK.  Baseline EF 55 to 60% with normal increase to 75-80% hyperdynamic function with exercise.  Normal recovery.  No wall motion abnormalities.   Family History  Problem Relation Age of Onset   Endometriosis Mother    Hyperlipidemia Mother    Osteopenia Mother    Aortic dissection Mother    Heart failure Father        heart attack @ 6 after CVA   Stroke Father 50       hemorrhagic  after MI   Diabetes Father    Tremor Father    Hyperlipidemia Brother    Liver cancer Maternal Aunt    Alzheimer's disease Maternal Grandmother    Heart attack Maternal Grandfather 64   Colon cancer Paternal Grandmother    Colon polyps Neg Hx    Esophageal cancer Neg Hx    Rectal cancer Neg Hx    Stomach cancer Neg Hx    Social History   Socioeconomic History    Marital status: Married    Spouse name: Not on file   Number of children: 0   Years of education: Not on file   Highest education level: Bachelor's degree (e.g., BA, AB, BS)  Occupational History   Occupation: Animal nutritionist  Tobacco Use   Smoking status: Never    Passive exposure: Never   Smokeless tobacco: Never  Vaping Use   Vaping status: Never Used  Substance and Sexual Activity   Alcohol use: Yes    Alcohol/week: 2.0 standard drinks of alcohol    Types: 2 Glasses of wine per week    Comment: 1-2 glasses per month -occasionally   Drug use: No   Sexual activity: Yes  Other Topics Concern   Not on file  Social History Narrative   Heart healthy diet   Regular Exercise-yes (gym 3 times a week)   Lives with husband       Her mother recently died -> per patient her mother (age 37) was doing well but on the day of her death she noted feeling unusual with a discomfort in her throat and jaw and a headache.  She was taken to the hospital by EMS based on the dispatch recommendations.  The next time she talked to a physician she was talking to the ER physician who is telling her that she was on multiple pressors to keep her blood pressures up enough to keep her alive.  Without them, she would not survive.  She was then seen by what sounds like a vascular surgeon who indicated that he felt like given her advanced age that she would not survive the potential corrective surgery.  Therefore once her family was able to be present, they withdrew care.         Subsequently, her brother had a significant change in his demeanor and became quite distant and evasive with her.  They have subsequently had a essentially complete separation.  This has been a source of a lot of stress with her.  Especially after her mother passing she was looking forward to being close to her brother.      Caregiver for her husband who was recently diagnosed with PMR-a significant stressor along with  relatively recent diagnosis of A-fib.   Social Drivers of Corporate investment banker Strain: Low Risk  (11/25/2023)   Overall Financial Resource Strain (CARDIA)    Difficulty of Paying Living Expenses: Not hard at all  Food Insecurity: No Food Insecurity (11/25/2023)   Hunger Vital Sign    Worried About Running Out of Food in the Last Year: Never true  Ran Out of Food in the Last Year: Never true  Transportation Needs: No Transportation Needs (11/25/2023)   PRAPARE - Administrator, Civil Service (Medical): No    Lack of Transportation (Non-Medical): No  Physical Activity: Insufficiently Active (11/25/2023)   Exercise Vital Sign    Days of Exercise per Week: 2 days    Minutes of Exercise per Session: 50 min  Stress: No Stress Concern Present (11/25/2023)   [employer] of Occupational Health - Occupational Stress Questionnaire    Feeling of Stress : Not at all  Social Connections: Socially Integrated (11/25/2023)   Social Connection and Isolation Panel [NHANES]    Frequency of Communication with Friends and Family: More than three times a week    Frequency of Social Gatherings with Friends and Family: More than three times a week    Attends Religious Services: More than 4 times per year    Active Member of [employer] or Organizations: Yes    Attends Banker Meetings: Never    Marital Status: Married    Tobacco Counseling - Non Smoker Counseling given: N/A   Clinical Intake: Completed 11/25/2023  Pre-visit preparation completed: Yes  Pain : No/denies pain     BMI - recorded: 22.76 Nutritional Status: BMI of 19-24  Normal Nutritional Risks: None Diabetes: No  How often do you need to have someone help you when you read instructions, pamphlets, or other written materials from your doctor or pharmacy?: 1 - Never  Interpreter Needed?: No  Information entered by :: Hassell Halim, CMA   Activities of Daily Living Completed 11/25/2023    11/25/2023    10:20 AM 12/07/2022    6:20 PM  In your present state of health, do you have any difficulty performing the following activities:  Hearing? 0 0  Vision? 0 0  Difficulty concentrating or making decisions? 0 0  Walking or climbing stairs? 0 0  Dressing or bathing? 0 0  Doing errands, shopping? 0 0  Preparing Food and eating ? N N  Using the Toilet? N N  In the past six months, have you accidently leaked urine? N Y  Comment  Stress incontinence  Do you have problems with loss of bowel control? N N  Managing your Medications? N N  Managing your Finances? N N  Housekeeping or managing your Housekeeping? N N    Patient Care Team: Pincus Sanes, MD as PCP - General (Internal Medicine) Marykay Lex, MD as PCP - Cardiology (Cardiology) Blima Ledger, OD as Consulting Physician (Optometry) Szabat, Vinnie Level, [facility] (Inactive) as Pharmacist (Pharmacist)  Indicate any recent Medical Services you may have received from other than [facility] providers in the past year (date may be approximate).     Assessment:   This is a routine wellness examination for Manda.  Hearing/Vision screen Hearing Screening - Comments:: Denies hearing difficulties   Vision Screening - Comments:: Wears rx glasses - up to date with routine eye exams with Dr Jacalyn Lefevre   Goals Addressed               This Visit's Progress     Patient Stated (pt-stated)        Patient stated that she plans to stay active.       Depression Screen Completed 11/25/2023    11/25/2023   10:26 AM 11/25/2023   10:25 AM 12/11/2022   10:36 AM 12/06/2021    3:25 PM 12/06/2021    3:22 PM 11/27/2020  1:13 PM 09/04/2020    3:01 PM  PHQ 2/9 Scores  PHQ - 2 Score 0 0 0 0 0 3 0    Fall Risk Completed 11/25/2023    11/25/2023   10:22 AM 12/07/2022    6:20 PM 12/06/2021    3:24 PM 12/05/2021    1:37 PM 11/27/2020    1:16 PM  Fall Risk   Falls in the past year? 0 0 0 0 0  Number falls in past yr: 0 0 0 0 0  Injury with Fall? 0 0 0 0  0  Risk for fall due to : No Fall Risks  No Fall Risks  No Fall Risks  Follow up Falls prevention discussed;Falls evaluation completed Falls evaluation completed;Falls prevention discussed Falls evaluation completed;Education provided      MEDICARE RISK AT HOME: Completed 11/25/2023 Medicare Risk at Home Any stairs in or around the home?: Yes If so, are there any without handrails?: No Home free of loose throw rugs in walkways, pet beds, electrical cords, etc?: Yes Adequate lighting in your home to reduce risk of falls?: Yes Life alert?: Yes (watch) Use of a cane, walker or w/c?: No Grab bars in the bathroom?: No Shower chair or bench in shower?: Yes Elevated toilet seat or a handicapped toilet?: Yes  TIMED UP AND GO:  Was the test performed?  No  Cognitive Function: 6CIT completed        11/25/2023   10:23 AM 12/11/2022   10:41 AM  6CIT Screen  What Year? 0 points 0 points  What month? 0 points 0 points  What time? 0 points 0 points  Count back from 20 0 points   Months in reverse 0 points 0 points  Repeat phrase 0 points 0 points  Total Score 0 points     Immunizations Immunization History  Administered Date(s) Administered   Fluad Quad(high Dose 65+) 06/21/2019, 07/19/2020, 07/10/2021, 07/08/2022   Fluad Trivalent(High Dose 65+) 07/09/2023   Influenza Split 07/31/2011, 07/14/2012, 09/29/2012   Influenza Whole 07/18/2008, 10/10/2009, 06/18/2010   Influenza, High Dose Seasonal PF 07/28/2016, 07/17/2017, 06/24/2018   Influenza, Seasonal, Injecte, Preservative Fre 06/29/2014   Influenza,inj,Quad PF,6+ Mos 07/20/2013, 07/20/2014, 07/24/2015   Moderna SARS-COV2 Booster Vaccination 10/06/2020   Moderna Sars-Covid-2 Vaccination 11/11/2019, 12/07/2019   Pneumococcal Conjugate-13 12/05/2016   Pneumococcal Polysaccharide-23 03/05/2018   Tdap 11/25/2006, 01/16/2017   Tetanus 01/16/2017   Zoster, Live 03/26/2016    Screening Tests Health Maintenance  Topic Date Due    Zoster Vaccines- Shingrix (1 of 2) 04/02/2001   COVID-19 Vaccine (4 - 2024-25 season) 05/31/2023   DEXA SCAN  01/11/2024   Medicare Annual Wellness (AWV)  11/24/2024   MAMMOGRAM  05/13/2025   DTaP/Tdap/Td (4 - Td or Tdap) 01/17/2027   Pneumonia Vaccine 32+ Years old  Completed   INFLUENZA VACCINE  Completed   Hepatitis C Screening  Completed   HPV VACCINES  Aged Out   Colonoscopy  Discontinued    Health Maintenance  Health Maintenance Due  Topic Date Due   Zoster Vaccines- Shingrix (1 of 2) 04/02/2001   COVID-19 Vaccine (4 - 2024-25 season) 05/31/2023   Health Maintenance Items Addressed: 11/25/2023.   Pt declines to get the COVID and Shingles vaccines. Educated and advised to get vaccines at [facility].  Mammogram status: Completed - 05/14/2023. Due in 2 years.  DEXA scan status: Completed 01/10/2022 at [place].  Due in 2025. Pt prefers to contact them to schedule an appt for 12/2023.  Colonoscopy  status: Completed 06/06/2022. Due in 10 years.   Additional Screening:  Vision Screening: Recommended annual ophthalmology exams for early detection of glaucoma and other disorders of the eye. Appt in 2025 w/Dr Jacalyn Lefevre.    Dental Screening: Recommended annual dental exams for proper oral hygiene.  Community Resource Referral / Chronic Care Management: CRR required this visit?  No   CCM required this visit?  No     Plan:     I have personally reviewed and noted the following in the patient's chart:   Medical and social history Use of alcohol, tobacco or illicit drugs  Current medications and supplements including opioid prescriptions. Patient is not currently taking opioid prescriptions. Functional ability and status Nutritional status Physical activity Advanced directives List of other physicians Hospitalizations, surgeries, and ER visits in previous 12 months Vitals Screenings to include cognitive, depression, and falls Referrals and appointments  In  addition, I have reviewed and discussed with patient certain preventive protocols, quality metrics, and best practice recommendations. A written personalized care plan for preventive services as well as general preventive health recommendations were provided to patient.     Darreld Mclean, CMA   11/25/2023   After Visit Summary: (MyChart) Due to this being a telephonic visit, the after visit summary with patients personalized plan was offered to patient via MyChart   Notes: Will have the pt to schedule a physical with Dr Lawerance Bach for 2025.

## 2023-11-25 NOTE — Patient Instructions (Addendum)
 Brittany Myers , Thank you for taking time to come for your Medicare Wellness Visit. I appreciate your ongoing commitment to your health goals. Please review the following plan we discussed and let me know if I can assist you in the future.   Referrals/Orders/Follow-Ups/Clinician Recommendations: Aim for 30 minutes of exercise or brisk walking, 6-8 glasses of water, and 5 servings of fruits and vegetables each day. DEXA scan due in 2025 -pt prefers to schedule.  Educated and advised to obtain the Shingles and COVID vaccines at local pharmacy.  This is a list of the screening recommended for you and due dates:  Health Maintenance  Topic Date Due   Zoster (Shingles) Vaccine (1 of 2) 04/02/2001   COVID-19 Vaccine (4 - 2024-25 season) 05/31/2023   DEXA scan (bone density measurement)  01/11/2024   Medicare Annual Wellness Visit  11/24/2024   Mammogram  05/13/2025   DTaP/Tdap/Td vaccine (4 - Td or Tdap) 01/17/2027   Pneumonia Vaccine  Completed   Flu Shot  Completed   Hepatitis C Screening  Completed   HPV Vaccine  Aged Out   Colon Cancer Screening  Discontinued    Advanced directives: (Provided) Advance directive discussed with you today. I have provided a copy for you to complete at home and have notarized. Once this is complete, please bring a copy in to our office so we can scan it into your chart.   Next Medicare Annual Wellness Visit scheduled for next year: Yes - 11/2024

## 2023-11-26 ENCOUNTER — Other Ambulatory Visit: Payer: Self-pay

## 2023-11-26 MED ORDER — ALENDRONATE SODIUM 70 MG PO TABS: 70.0000 mg | ORAL_TABLET | ORAL | 3 refills | Status: AC

## 2023-11-29 ENCOUNTER — Ambulatory Visit
Admission: RE | Admit: 2023-11-29 | Discharge: 2023-11-29 | Disposition: A | Discharge status: Home / Self Care | Payer: Medicare Other | Source: Ambulatory Visit | Attending: Family Medicine | Admitting: Family Medicine

## 2023-11-29 DIAGNOSIS — M23321 Other meniscus derangements, posterior horn of medial meniscus, right knee: Secondary | ICD-10-CM | POA: Diagnosis not present

## 2023-11-29 DIAGNOSIS — M23361 Other meniscus derangements, other lateral meniscus, right knee: Secondary | ICD-10-CM | POA: Diagnosis not present

## 2023-11-29 DIAGNOSIS — M25561 Pain in right knee: Secondary | ICD-10-CM | POA: Diagnosis not present

## 2023-11-29 DIAGNOSIS — G8929 Other chronic pain: Secondary | ICD-10-CM

## 2023-12-08 ENCOUNTER — Encounter: Payer: Self-pay | Admitting: Family Medicine

## 2023-12-08 ENCOUNTER — Telehealth: Payer: Self-pay | Admitting: Family Medicine

## 2023-12-08 DIAGNOSIS — M23203 Derangement of unspecified medial meniscus due to old tear or injury, right knee: Secondary | ICD-10-CM

## 2023-12-08 DIAGNOSIS — G8929 Other chronic pain: Secondary | ICD-10-CM

## 2023-12-08 NOTE — Telephone Encounter (Signed)
Orthopedic surgery consultation ordered today.

## 2023-12-08 NOTE — Progress Notes (Signed)
 Right knee MRI shows a meniscus tear.  I am going to refer you to orthopedic surgery to talk about surgical options.  This is potentially improvable with surgery.

## 2023-12-31 DIAGNOSIS — D2272 Melanocytic nevi of left lower limb, including hip: Secondary | ICD-10-CM | POA: Diagnosis not present

## 2023-12-31 DIAGNOSIS — Z85828 Personal history of other malignant neoplasm of skin: Secondary | ICD-10-CM | POA: Diagnosis not present

## 2023-12-31 DIAGNOSIS — L821 Other seborrheic keratosis: Secondary | ICD-10-CM | POA: Diagnosis not present

## 2023-12-31 DIAGNOSIS — L820 Inflamed seborrheic keratosis: Secondary | ICD-10-CM | POA: Diagnosis not present

## 2023-12-31 DIAGNOSIS — L812 Freckles: Secondary | ICD-10-CM | POA: Diagnosis not present

## 2023-12-31 DIAGNOSIS — D1801 Hemangioma of skin and subcutaneous tissue: Secondary | ICD-10-CM | POA: Diagnosis not present

## 2023-12-31 DIAGNOSIS — D225 Melanocytic nevi of trunk: Secondary | ICD-10-CM | POA: Diagnosis not present

## 2024-02-05 DIAGNOSIS — S83241A Other tear of medial meniscus, current injury, right knee, initial encounter: Secondary | ICD-10-CM | POA: Diagnosis not present

## 2024-05-02 ENCOUNTER — Ambulatory Visit (INDEPENDENT_AMBULATORY_CARE_PROVIDER_SITE_OTHER): Admitting: Internal Medicine

## 2024-05-02 ENCOUNTER — Ambulatory Visit (INDEPENDENT_AMBULATORY_CARE_PROVIDER_SITE_OTHER)

## 2024-05-02 ENCOUNTER — Ambulatory Visit: Payer: Self-pay | Admitting: Internal Medicine

## 2024-05-02 ENCOUNTER — Ambulatory Visit: Payer: Self-pay

## 2024-05-02 ENCOUNTER — Encounter: Payer: Self-pay | Admitting: Internal Medicine

## 2024-05-02 VITALS — BP 118/70 | HR 70 | Temp 97.8°F | Ht 64.0 in | Wt 130.0 lb

## 2024-05-02 DIAGNOSIS — R000 Tachycardia, unspecified: Secondary | ICD-10-CM

## 2024-05-02 DIAGNOSIS — R059 Cough, unspecified: Secondary | ICD-10-CM | POA: Diagnosis not present

## 2024-05-02 DIAGNOSIS — F419 Anxiety disorder, unspecified: Secondary | ICD-10-CM | POA: Diagnosis not present

## 2024-05-02 DIAGNOSIS — R052 Subacute cough: Secondary | ICD-10-CM

## 2024-05-02 MED ORDER — OMEPRAZOLE 40 MG PO CPDR: 40.0000 mg | DELAYED_RELEASE_CAPSULE | Freq: Every day | ORAL | 0 refills | Status: DC

## 2024-05-02 NOTE — Progress Notes (Signed)
 cxr   Subjective:    Patient ID: Brittany Myers, female    DOB: [birth year], 73 y.o.   MRN: [id number]      HPI Brittany Myers is here for  Chief Complaint  Patient presents with   Cough    x1 month ongoing. Last time she had this happened it was GERD so this time she has limited her diet and started taking pepcid  for a week, didn't notice a change.   Also mentions her higher heart rate during this for the last 3 weeks, said she saw Dr. Geofm for this and went to cardiology and this was worked up, nothing was wrong    Dry cough - 50% mucus - white and yellow-started at least 1 month ago.  She also states some hoarseness and tightness in her chest at times.  She stopped drinking all caffeine and alcohol to see if that would help, but has not.  She does not take any NSAIDs.  She has had some increased stress over the past month or so.  She does not recall having any cold symptoms that this started with.  She has taken lorazepam  on occasion, but never takes it more than 3 days in a row.  She has not been exercising as much as usual because her husband just had surgery  Elevated HR intermittently-continues to have an elevated heart rate at rest-within the last 24 hours for heart rate was up to 108 while she was sitting.  He has had this intermittently and it does not have a reason for being elevated.  She uses her Apple Watch and kardia device and no A-fib has been detected.  Medications and allergies reviewed with patient and updated if appropriate.  Current Outpatient Medications on File Prior to Visit  Medication Sig Dispense Refill   alendronate  (FOSAMAX ) 70 MG tablet Take 1 tablet (70 mg total) by mouth once a week. Take with a full glass of water on an empty stomach. 12 tablet 3   EPINEPHrine  0.3 mg/0.3 mL IJ SOAJ injection Inject 0.3 mLs (0.3 mg total) into the muscle as needed. 2 each 0   estradiol  (ESTRACE ) 0.1 MG/GM vaginal cream Place 1 Applicatorful vaginally 2 (two) times a week. 42.5  g 11   LORazepam  (ATIVAN ) 0.5 MG tablet Take 1 tablet (0.5 mg total) by mouth at bedtime as needed for anxiety or sleep. take 1 tablet by mouth every 8 hours if needed for anxiety 30 tablet 5   Multiple Minerals-Vitamins (CALCIUM  CITRATE +) TABS Taking daily  0   Multiple Vitamin (MULTIVITAMIN) capsule Take 1 capsule by mouth daily.     No current facility-administered medications on file prior to visit.    Review of Systems  Constitutional:  Negative for fever.  HENT:  Positive for voice change. Negative for congestion, sinus pressure, sinus pain and sore throat.   Respiratory:  Positive for cough (dry - 50% has a little mucus - yellow or white). Negative for shortness of breath and wheezing.   Cardiovascular:  Positive for palpitations (feels tightness in chest with elevated HR). Negative for chest pain.  Gastrointestinal:  Negative for abdominal pain and nausea.       No gerd  Neurological:  Positive for dizziness (some - getting up quick) and headaches (occ - nothing new).       Objective:   Vitals:   05/02/24 1545  BP: 118/70  Pulse: 70  Temp: 97.8 F (36.6 C)  SpO2: 98%   BP Readings from  Last 3 Encounters:  05/02/24 118/70  11/25/23 120/62  11/16/23 104/78   Wt Readings from Last 3 Encounters:  05/02/24 130 lb (59 kg)  11/25/23 132 lb 9.6 oz (60.1 kg)  11/16/23 131 lb (59.4 kg)   Body mass index is 22.31 kg/m.    Physical Exam Constitutional:      General: She is not in acute distress.    Appearance: Normal appearance.  HENT:     Head: Normocephalic and atraumatic.  Eyes:     Conjunctiva/sclera: Conjunctivae normal.  Cardiovascular:     Rate and Rhythm: Normal rate and regular rhythm.     Heart sounds: Normal heart sounds.  Pulmonary:     Effort: Pulmonary effort is normal. No respiratory distress.     Breath sounds: Normal breath sounds. No wheezing.  Musculoskeletal:     Cervical back: Neck supple.     Right lower leg: No edema.     Left lower leg:  No edema.  Lymphadenopathy:     Cervical: No cervical adenopathy.  Skin:    General: Skin is warm and dry.     Findings: No rash.  Neurological:     Mental Status: She is alert. Mental status is at baseline.  Psychiatric:        Mood and Affect: Mood normal.        Behavior: Behavior normal.        DG Chest 2 View CLINICAL DATA:  Cough.  EXAM: CHEST - 2 VIEW  COMPARISON:  06/24/2018.  FINDINGS: Trachea is midline. Heart size normal. Lungs are mildly hyperinflated but clear. No pleural fluid.  IMPRESSION: Mild hyperinflation.  No acute findings.  Electronically Signed   By: Newell Eke M.D.   On: 05/02/2024 16:38      Assessment & Plan:    See Problem List for Assessment and Plan of chronic medical problems.

## 2024-05-02 NOTE — Assessment & Plan Note (Signed)
 Chronic Increased recently Has not been able to get to the gym but will be able to start back this week which will help Continue lorazepam  as needed-does not take daily and will never take more than 3 days in a row I think the benefits outweigh the risks of her taking all lorazepam  Does not want a daily medication which I think at this point is reasonable

## 2024-05-02 NOTE — Telephone Encounter (Signed)
  FYI Only or Action Required?: FYI only for provider.  Patient was last seen in primary care on 07/13/2023 by Geofm Glade PARAS, MD.  Called Nurse Triage reporting Cough.  Symptoms began about a month ago.  Interventions attempted: OTC medications: pepcid  and Other: eliminated caffeine and alcohol.  Symptoms are: unchanged.  Triage Disposition: See PCP When Office is Open (Within 3 Days)  Patient/caregiver understands and will follow disposition?: Yes   Copied from CRM [id number]. Topic: Clinical - Red Word Triage >> May 02, 2024  9:42 AM Avram MATSU wrote: Red Word that prompted transfer to Nurse Triage: pt has been coughing for a week and only noticed a rapid heart rate. Reason for Disposition  Cough has been present for > 3 weeks  Answer Assessment - Initial Assessment Questions 1. ONSET: When did the cough begin?      One month ago 2. SEVERITY: How bad is the cough today?      Feels ok, just annoying cough 3. SPUTUM: Describe the color of your sputum (e.g., none, dry cough; clear, white, yellow, green)     Very little, white ish/yellow 4. HEMOPTYSIS: Are you coughing up any blood? If Yes, ask: How much? (e.g., flecks, streaks, tablespoons, etc.)     denies 5. DIFFICULTY BREATHING: Are you having difficulty breathing? If Yes, ask: How bad is it? (e.g., mild, moderate, severe)      denies 6. FEVER: Do you have a fever? If Yes, ask: What is your temperature, how was it measured, and when did it start?     denies 7. CARDIAC HISTORY: Do you have any history of heart disease? (e.g., heart attack, congestive heart failure)      No underlying cardiac conditions, but does report history of racing heart from time to time.  States that she has had full cardiac work up 8. LUNG HISTORY: Do you have any history of lung disease?  (e.g., pulmonary embolus, asthma, emphysema)     denies 9. PE RISK FACTORS: Do you have a history of blood clots? (or: recent major surgery,  recent prolonged travel, bedridden)     denies 10. OTHER SYMPTOMS: Do you have any other symptoms? (e.g., runny nose, wheezing, chest pain)       Reports elevated heart rate from time to time, but this is not new symptom 11. PREGNANCY: Is there any chance you are pregnant? When was your last menstrual period?       N/A 12. TRAVEL: Have you traveled out of the country in the last month? (e.g., travel history, exposures)       denies  Protocols used: Cough - Acute Non-Productive-A-AH

## 2024-05-02 NOTE — Assessment & Plan Note (Signed)
 Chronic Has had intermittent tachycardia at rest and with some activity Apple watch and kardia have not detected atrial fibrillation Could be stress related Had Holter monitor couple of years ago which is not concerning Will monitor If persists can have cardiology reevaluate

## 2024-05-02 NOTE — Patient Instructions (Addendum)
    Have a chest xray downstairs    Medications changes include :   take zyrtec at night   Take omeprazole  40 mg daily x 2 weeks      Return if symptoms worsen or fail to improve.

## 2024-05-02 NOTE — Assessment & Plan Note (Signed)
 Subacute Started about 1 month ago No obvious cause-no cold no obvious allergies ?  Allergy related, silent GERD related Chest x-ray today Start Zyrtec nightly Start omeprazole  40 mg daily 30 minutes prior to meal x 2 weeks and then stop She will let me know if this not improving

## 2024-05-04 ENCOUNTER — Ambulatory Visit: Admitting: Internal Medicine

## 2024-05-04 DIAGNOSIS — H2513 Age-related nuclear cataract, bilateral: Secondary | ICD-10-CM | POA: Diagnosis not present

## 2024-05-04 DIAGNOSIS — H04123 Dry eye syndrome of bilateral lacrimal glands: Secondary | ICD-10-CM | POA: Diagnosis not present

## 2024-05-04 DIAGNOSIS — H53143 Visual discomfort, bilateral: Secondary | ICD-10-CM | POA: Diagnosis not present

## 2024-05-04 DIAGNOSIS — H524 Presbyopia: Secondary | ICD-10-CM | POA: Diagnosis not present

## 2024-05-19 DIAGNOSIS — Z1231 Encounter for screening mammogram for malignant neoplasm of breast: Secondary | ICD-10-CM | POA: Diagnosis not present

## 2024-05-19 LAB — HM MAMMOGRAPHY

## 2024-05-23 ENCOUNTER — Encounter: Payer: Self-pay | Admitting: Internal Medicine

## 2024-06-16 DIAGNOSIS — M8589 Other specified disorders of bone density and structure, multiple sites: Secondary | ICD-10-CM | POA: Diagnosis not present

## 2024-06-16 LAB — HM DEXA SCAN

## 2024-06-21 ENCOUNTER — Encounter: Payer: Self-pay | Admitting: Internal Medicine

## 2024-06-26 ENCOUNTER — Ambulatory Visit: Payer: Self-pay | Admitting: Internal Medicine

## 2024-06-26 DIAGNOSIS — M810 Age-related osteoporosis without current pathological fracture: Secondary | ICD-10-CM

## 2024-07-07 DIAGNOSIS — L821 Other seborrheic keratosis: Secondary | ICD-10-CM | POA: Diagnosis not present

## 2024-07-07 DIAGNOSIS — Z85828 Personal history of other malignant neoplasm of skin: Secondary | ICD-10-CM | POA: Diagnosis not present

## 2024-07-07 DIAGNOSIS — L570 Actinic keratosis: Secondary | ICD-10-CM | POA: Diagnosis not present

## 2024-07-07 DIAGNOSIS — L812 Freckles: Secondary | ICD-10-CM | POA: Diagnosis not present

## 2024-07-07 DIAGNOSIS — D1801 Hemangioma of skin and subcutaneous tissue: Secondary | ICD-10-CM | POA: Diagnosis not present

## 2024-07-07 DIAGNOSIS — L820 Inflamed seborrheic keratosis: Secondary | ICD-10-CM | POA: Diagnosis not present

## 2024-07-12 NOTE — Patient Instructions (Incomplete)
   Medications changes include :   fluoxetine 20 mg daily,  xanax 0.5 mg twice daily as needed for panic attack.  Buspar 5 mg at night as needed for sleep.

## 2024-07-12 NOTE — Progress Notes (Unsigned)
    Subjective:    Patient ID: Brittany Myers, female    DOB: June 22, 1951, 73 y.o.   MRN: [id number]      HPI Dian is here for No chief complaint on file.   Sleeping issues   Allergies - having to keep clear throat     Medications and allergies reviewed with patient and updated if appropriate.  Current Outpatient Medications on File Prior to Visit  Medication Sig Dispense Refill   alendronate  (FOSAMAX ) 70 MG tablet Take 1 tablet (70 mg total) by mouth once a week. Take with a full glass of water on an empty stomach. 12 tablet 3   EPINEPHrine  0.3 mg/0.3 mL IJ SOAJ injection Inject 0.3 mLs (0.3 mg total) into the muscle as needed. 2 each 0   estradiol  (ESTRACE ) 0.1 MG/GM vaginal cream Place 1 Applicatorful vaginally 2 (two) times a week. 42.5 g 11   LORazepam  (ATIVAN ) 0.5 MG tablet Take 1 tablet (0.5 mg total) by mouth at bedtime as needed for anxiety or sleep. take 1 tablet by mouth every 8 hours if needed for anxiety 30 tablet 5   Multiple Minerals-Vitamins (CALCIUM  CITRATE +) TABS Taking daily  0   Multiple Vitamin (MULTIVITAMIN) capsule Take 1 capsule by mouth daily.     omeprazole  (PRILOSEC) 40 MG capsule Take 1 capsule (40 mg total) by mouth daily. 14 capsule 0   No current facility-administered medications on file prior to visit.    Review of Systems     Objective:  There were no vitals filed for this visit. BP Readings from Last 3 Encounters:  05/02/24 118/70  11/25/23 120/62  11/16/23 104/78   Wt Readings from Last 3 Encounters:  05/02/24 130 lb (59 kg)  11/25/23 132 lb 9.6 oz (60.1 kg)  11/16/23 131 lb (59.4 kg)   There is no height or weight on file to calculate BMI.    Physical Exam         Assessment & Plan:    See Problem List for Assessment and Plan of chronic medical problems.

## 2024-07-13 ENCOUNTER — Ambulatory Visit: Payer: Self-pay | Admitting: Internal Medicine

## 2024-07-13 ENCOUNTER — Encounter: Payer: Self-pay | Admitting: Internal Medicine

## 2024-07-13 ENCOUNTER — Ambulatory Visit (INDEPENDENT_AMBULATORY_CARE_PROVIDER_SITE_OTHER): Admitting: Internal Medicine

## 2024-07-13 VITALS — BP 120/78 | HR 63 | Temp 98.0°F | Ht 64.0 in | Wt 132.0 lb

## 2024-07-13 DIAGNOSIS — F419 Anxiety disorder, unspecified: Secondary | ICD-10-CM | POA: Diagnosis not present

## 2024-07-13 DIAGNOSIS — R0982 Postnasal drip: Secondary | ICD-10-CM | POA: Diagnosis not present

## 2024-07-13 DIAGNOSIS — R5383 Other fatigue: Secondary | ICD-10-CM | POA: Diagnosis not present

## 2024-07-13 DIAGNOSIS — M810 Age-related osteoporosis without current pathological fracture: Secondary | ICD-10-CM

## 2024-07-13 DIAGNOSIS — Z23 Encounter for immunization: Secondary | ICD-10-CM

## 2024-07-13 DIAGNOSIS — R739 Hyperglycemia, unspecified: Secondary | ICD-10-CM | POA: Diagnosis not present

## 2024-07-13 DIAGNOSIS — G479 Sleep disorder, unspecified: Secondary | ICD-10-CM

## 2024-07-13 LAB — CBC WITH DIFFERENTIAL/PLATELET
Basophils Absolute: 0 K/uL (ref 0.0–0.1)
Basophils Relative: 0.8 % (ref 0.0–3.0)
Eosinophils Absolute: 0.2 K/uL (ref 0.0–0.7)
Eosinophils Relative: 3 % (ref 0.0–5.0)
HCT: 45.7 % (ref 36.0–46.0)
Hemoglobin: 15.2 g/dL — ABNORMAL HIGH (ref 12.0–15.0)
Lymphocytes Relative: 28.6 % (ref 12.0–46.0)
Lymphs Abs: 1.7 K/uL (ref 0.7–4.0)
MCHC: 33.3 g/dL (ref 30.0–36.0)
MCV: 91.9 fl (ref 78.0–100.0)
Monocytes Absolute: 0.5 K/uL (ref 0.1–1.0)
Monocytes Relative: 9.3 % (ref 3.0–12.0)
Neutro Abs: 3.4 K/uL (ref 1.4–7.7)
Neutrophils Relative %: 58.3 % (ref 43.0–77.0)
Platelets: 208 K/uL (ref 150.0–400.0)
RBC: 4.97 Mil/uL (ref 3.87–5.11)
RDW: 12.7 % (ref 11.5–15.5)
WBC: 5.8 K/uL (ref 4.0–10.5)

## 2024-07-13 LAB — COMPREHENSIVE METABOLIC PANEL WITH GFR
ALT: 18 U/L (ref 0–35)
AST: 22 U/L (ref 0–37)
Albumin: 4.5 g/dL (ref 3.5–5.2)
Alkaline Phosphatase: 48 U/L (ref 39–117)
BUN: 18 mg/dL (ref 6–23)
CO2: 26 meq/L (ref 19–32)
Calcium: 9.1 mg/dL (ref 8.4–10.5)
Chloride: 105 meq/L (ref 96–112)
Creatinine, Ser: 0.8 mg/dL (ref 0.40–1.20)
GFR: 73.17 mL/min (ref >60.00)
Glucose, Bld: 104 mg/dL — ABNORMAL HIGH (ref 70–99)
Potassium: 3.9 meq/L (ref 3.5–5.1)
Sodium: 139 meq/L (ref 135–145)
Total Bilirubin: 0.7 mg/dL (ref 0.2–1.2)
Total Protein: 7.1 g/dL (ref 6.0–8.3)

## 2024-07-13 LAB — LIPID PANEL
Cholesterol: 178 mg/dL (ref 0–200)
HDL: 62.5 mg/dL (ref >39.00)
LDL Cholesterol: 99 mg/dL (ref 0–99)
NonHDL: 115.37
Total CHOL/HDL Ratio: 3
Triglycerides: 81 mg/dL (ref 0.0–149.0)
VLDL: 16.2 mg/dL (ref 0.0–40.0)

## 2024-07-13 LAB — HEMOGLOBIN A1C: Hgb A1c MFr Bld: 5.8 % (ref 4.6–6.5)

## 2024-07-13 LAB — TSH: TSH: 3.03 u[IU]/mL (ref 0.35–5.50)

## 2024-07-13 LAB — VITAMIN D 25 HYDROXY (VIT D DEFICIENCY, FRACTURES): VITD: 36.77 ng/mL (ref 30.00–100.00)

## 2024-07-13 MED ORDER — EPINEPHRINE 0.3 MG/0.3ML IJ SOAJ: 0.3000 mg | INTRAMUSCULAR | 0 refills | Status: AC | PRN

## 2024-07-13 MED ORDER — CITALOPRAM HYDROBROMIDE 10 MG PO TABS: 10.0000 mg | ORAL_TABLET | Freq: Every day | ORAL | Status: DC

## 2024-07-13 MED ORDER — LORAZEPAM 0.5 MG PO TABS: 0.5000 mg | ORAL_TABLET | Freq: Every evening | ORAL | 5 refills | Status: AC | PRN

## 2024-07-13 NOTE — Assessment & Plan Note (Signed)
 Chronic DEXA up-to-date Currently taking Fosamax  70 mg weekly-she does not feel well after taking it, but does not like the alternatives and he knows she needs to do something for her bones Continue regular exercise Continue multivitamin Check vitamin D  level

## 2024-07-13 NOTE — Assessment & Plan Note (Addendum)
 Chronic Mild anxiety-worse at night which is affecting her sleep She will try a couple of supplements that she has heard would have been helpful for other friends-L thiamine and ashwagandha , can also consider trying magnesium glycinate Retry citalopram  10 mg daily-can try this first or the supplements Discussed possibly seeing a therapist or talking to somebody

## 2024-07-13 NOTE — Assessment & Plan Note (Signed)
 Chronic Mild, but enough that she is not as active as she would like to be Likely all related to not doing enough sleep and not good quality sleep Will work on improving anxiety which may help with her sleep She is hypervigilant with her husband's health because he has had several health issues and she feels like she needs to be able to monitor his health We may try sleeping in a separate room and her husband to see if that helps

## 2024-07-13 NOTE — Assessment & Plan Note (Addendum)
 Chronic Likely related to allergies Continue oral antihistamine Use Flonase  daily

## 2024-07-13 NOTE — Assessment & Plan Note (Signed)
 Chronic Currently not taking anything for sleep Anxiety and some stressors likely contributing to her sleep issues in addition to being hypervigilant because of her husband's health and wanting to take care of him Will try some supplements and adding him citalopram  to see if that helps with some of the anxiety Continue regular exercise Consider seeing a therapist if nothing else is helping Will also consider sleeping in a separate room from her husband to see if that helps-this may reset her sleep schedule and may not need to be long-term

## 2024-07-13 NOTE — Assessment & Plan Note (Signed)
 Chronic Has a family history of diabetes Will check A1c  Continue regular exercise Continue healthy diet - low in sugars

## 2024-09-20 ENCOUNTER — Ambulatory Visit

## 2024-09-20 ENCOUNTER — Other Ambulatory Visit: Payer: Self-pay

## 2024-09-20 ENCOUNTER — Ambulatory Visit: Admitting: Family Medicine

## 2024-09-20 VITALS — BP 100/72 | HR 76 | Ht 64.0 in | Wt 132.0 lb

## 2024-09-20 DIAGNOSIS — M25572 Pain in left ankle and joints of left foot: Secondary | ICD-10-CM | POA: Diagnosis not present

## 2024-09-20 DIAGNOSIS — M5416 Radiculopathy, lumbar region: Secondary | ICD-10-CM | POA: Diagnosis not present

## 2024-09-20 DIAGNOSIS — G8929 Other chronic pain: Secondary | ICD-10-CM

## 2024-09-20 NOTE — Progress Notes (Signed)
 "        I, Ileana Collet, PhD, LAT, ATC acting as a scribe for Artist Lloyd, MD.  Brittany Myers is a 73 y.o. female who presents to [facility] today for ankle pain. Pt was previously seen by Dr. Lloyd on 11/16/23 for R knee pain.  Today, pt c/o L ankle pain ongoing since this summer. The past 2-wks the L ankle has been very painful. Hx of prior 5th MT fx/Lis Franc fx in 2020. Pt locates pain to the lateral ankle, posterior and distal to the lateral malleolus  Swelling: no Aggravates: high heels, increase activity Treatments tried: orthopedic shoes, special made insoles, changing shoes  Pertinent review of systems: No fevers or chills  Relevant historical information: History of knee pain.   Exam:  BP 100/72   Pulse 76   Ht 5' 4 (1.626 m)   Wt 132 lb (59.9 kg)   SpO2 97%   BMI 22.66 kg/m  General: Well Developed, well nourished, and in no acute distress.   MSK: Left ankle normal appearing Not particularly tender palpation.  Normal ankle motion.  Intact strength.  Spine: Normal-appearing nontender palpation midline.  Normal lumbar motion.  Lower extremity strength is intact.    Lab and Radiology Results  Diagnostic Limited MSK Ultrasound of: Left lateral ankle Pertinent tendons are intact without significant hypoechoic fluid. Mild degenerative changes present at lateral ankle. Impression: Lateral ankle DJD  X-ray images lumbar spine and left ankle obtained today personally and independently interpreted.  Lumbar spine: No acute fractures no severe degenerative changes.  Left ankle: Mild ankle DJD.  No acute fractures are visible.  Await formal radiology review   Assessment and Plan: 73 y.o. female with left lateral ankle and lateral leg pain.  This could be a more specific to the ankle etiology such as ankle arthritis or tendinopathy of the peroneal tendons.  I think more likely to be the cause is lumbar radiculopathy.  Plan for trial of  physical therapy and x-rays as above.   PDMP not reviewed this encounter. Orders Placed This Encounter  Procedures   US  LIMITED JOINT SPACE STRUCTURES LOW LEFT(NO LINKED CHARGES)    Reason for Exam (SYMPTOM  OR DIAGNOSIS REQUIRED):   left ankle pain    Preferred imaging location?:   [facility] Ankle Complete Left    Standing Status:   Future    Number of Occurrences:   1    Expiration Date:   09/20/2025    Reason for Exam (SYMPTOM  OR DIAGNOSIS REQUIRED):   left ankle pain    Preferred imaging location?:   [facility]   DG Lumbar Spine 2-3 Views    Standing Status:   Future    Number of Occurrences:   1    Expiration Date:   10/21/2024    Reason for Exam (SYMPTOM  OR DIAGNOSIS REQUIRED):   lumbar radiculopathy    Preferred imaging location?:   [facility]   Ambulatory referral to Physical Therapy    Referral Priority:   Routine    Referral Type:   Physical Medicine    Referral Reason:   Specialty Services Required    Requested Specialty:   Physical Therapy    Number of Visits Requested:   1   No orders of the defined types were placed in this encounter.    Discussed warning signs or symptoms. Please see discharge instructions. Patient expresses understanding.  The above documentation has been reviewed and is accurate and complete Artist Lloyd, M.D.   "

## 2024-09-20 NOTE — Patient Instructions (Addendum)
 Thank you for coming in today.   Please get an Xray today before you leave   I've referred you to Physical Therapy here, at this office.  You will hear from our office soon about scheduling, once we check with your insurance company.

## 2024-09-26 ENCOUNTER — Telehealth: Payer: Self-pay | Admitting: Cardiology

## 2024-09-26 NOTE — Telephone Encounter (Signed)
 Patient states over the holidays she has noticed occasionally her heart rate will elevate over 100, she will feel dizzy and her chest feels heavy, but more at the top of the stomach. She denies any SOB, radiating pain, syncope/presyncope. Episodes are short lasting.  Patient states she has cut out caffeine and alcohol but has had some over the holidays. She is not good with remembering to drink water but states she will work on this. She reports her house is on the market and she has had several losses over the past year. She does not want to take an SSRI, stating she does not want to have to take a pill to feel calm.  She wears an Apple Watch that will alert her when her HR is over 100. HR range has been 60-117. There was one instance her HR was 146, but she states this was during a time she was having a heated discussion with a family member.  She goes to the gym 3 times a week. She does not feel chest pressure or dizziness during exercise.   Will send patient a MyChart message with instructions on sending EKG readings from Apple Watch for provider to review.  Patient has appt with Dr. Anner on 10/12/2024.  Advised patient on ED precautions, patient verbalized understanding.

## 2024-09-26 NOTE — Telephone Encounter (Signed)
 Patient c/o Palpitations:  STAT if patient reporting lightheadedness, shortness of breath, or chest pain  How long have you had palpitations/irregular HR/ Afib? Are you having the symptoms now? 2 Months  Are you currently experiencing lightheadedness, SOB or CP? Dizziness, Chest Pressure  Do you have a history of afib (atrial fibrillation) or irregular heart rhythm? No  Have you checked your BP or HR? (document readings if available): HR- 87  Are you experiencing any other symptoms? Dizziness, Tachycardia

## 2024-09-27 NOTE — Telephone Encounter (Signed)
 Patient already scheduled to see Dr. Anner on 10/12/2024. No sooner appts with Dr. Anner or APP available.

## 2024-09-29 ENCOUNTER — Encounter: Payer: Self-pay | Admitting: Cardiology

## 2024-10-03 ENCOUNTER — Ambulatory Visit: Admitting: Physical Therapy

## 2024-10-03 ENCOUNTER — Other Ambulatory Visit: Payer: Self-pay

## 2024-10-03 ENCOUNTER — Encounter: Payer: Self-pay | Admitting: Physical Therapy

## 2024-10-03 DIAGNOSIS — M25572 Pain in left ankle and joints of left foot: Secondary | ICD-10-CM | POA: Diagnosis not present

## 2024-10-03 DIAGNOSIS — M6281 Muscle weakness (generalized): Secondary | ICD-10-CM

## 2024-10-03 NOTE — Therapy (Signed)
 " OUTPATIENT PHYSICAL THERAPY EVALUATION   Patient Name: Brittany Myers MRN: [id number] DOB:11-12-1950, 74 y.o., female Today's Date: 10/03/2024   END OF SESSION:  PT End of Session - 10/03/24 1146     Visit Number 1    Number of Visits 9    Date for Recertification  11/28/24    Authorization Type [employer] MCR    Progress Note Due on Visit 10    PT Start Time 1145    PT Stop Time 1230    PT Time Calculation (min) 45 min    Activity Tolerance Patient tolerated treatment well    Behavior During Therapy [facility] for tasks assessed/performed          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  Minimal aortic root calcification   HEMORRHOID SURGERY     LAPAROSCOPIC ENDOMETRIOSIS FULGURATION     5 surgeries; Dr Paola, Gyn   POLYPECTOMY  09/30/1999   Dr.Sam Talent    TREADMILL ECHO STRESS TEST  05/26/2016   Electrically and echocardiographically negative stress echo.  No ischemia or infarction.  LOW RISK.  Baseline EF 55 to 60% with normal increase to 75-80% hyperdynamic function with exercise.  Normal recovery.  No wall motion abnormalities.   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   Facet arthropathy, multilevel 03/02/2012   Family history of ischemic heart disease 05/20/2011   Osteoporosis 05/20/2011   Sleep disorder 05/15/2010   TINNITUS, RIGHT 12/20/2009   COLONIC POLYPS, BENIGN 02/11/2008    PCP: Geofm Glade PARAS, MD   REFERRING PROVIDER: Joane Artist RAMAN, MD  REFERRING DIAG: Chronic pain of left ankle  THERAPY DIAG:  Pain in left ankle and joints of left foot  Muscle weakness (generalized)  Rationale for Evaluation and Treatment: Rehabilitation  ONSET DATE: Chronic   SUBJECTIVE:  SUBJECTIVE STATEMENT: Patient reports she broke her left foot a couple years ago and it took a while to heal, and she also reports chronic plantar fascitis bilaterally. Then all of sudden the outside of the left ankle started hurting. The pain comes and goes. If she has been sitting or inactive for extended periods of time, then when she gets up to stand or walk then she will have pain with walking and it will remain tender for a day or so. If she pumps the ankle while sitting or when it happens then that will help with the pain. She also reports soreness toward the end of the with a lot  of walking. The pain on the outside of the left can go up the outside of the leg and feels like a charlie horse. She does report a right knee meniscus injury. Currently she does weight training and cardio in the gym, she uses machines, she is not running but has been using the elliptical.   PERTINENT HISTORY: See PMH above  PAIN:  Are you having pain? Yes:  NPRS scale: 0/10 currently, 7/10 at worst Pain location: Left lateral ankle, lateral lower leg Pain description: charlie horse Aggravating factors: Standing/walking after sitting for extended period of time, extended periods of walking  Relieving factors: Rest, gentle movement  PRECAUTIONS: None  RED  FLAGS: None   WEIGHT BEARING RESTRICTIONS: No  FALLS:  Has patient fallen in last 6 months? No  PLOF: Independent  PATIENT GOALS: Pain relief    OBJECTIVE:  Note: Objective measures were completed at Evaluation unless otherwise noted. PATIENT SURVEYS:  LEFS: 72/80  COGNITION: Overall cognitive status: Within functional limits for tasks assessed     SENSATION: WFL  EDEMA:  No observable swelling noted  MUSCLE LENGTH: Grossly WFL  POSTURE:   Grossly WFL  PALPATION: Tender to palpation left peroneal muscle belly  LOWER EXTREMITY ROM:  Active ROM Right eval Left eval  Hip flexion    Hip extension    Hip abduction    Hip adduction    Hip internal rotation    Hip external rotation    Knee flexion    Knee extension    Ankle dorsiflexion 13 12  Ankle plantarflexion 55 51  Ankle inversion 43 40  Ankle eversion 10 2   (Blank rows = not tested)  LOWER EXTREMITY MMT:  MMT Right eval Left eval  Hip flexion    Hip extension 4 4-  Hip abduction 4 4-  Hip adduction    Hip internal rotation    Hip external rotation    Knee flexion    Knee extension    Ankle dorsiflexion 5 5  Ankle plantarflexion 5 4  Ankle inversion 5 4  Ankle eversion 5 4   (Blank rows = not tested)  FUNCTIONAL TESTS:  Not formally assessed  GAIT: Assistive device utilized: None Level of assistance: Complete Independence Comments: grossly WFL                                                                                                                               TREATMENT [facility] Adult PT Treatment:                                                DATE: 10/04/2023 Side clamshell with red x 15 Longsitting ankle eversion with green x 15 Seated SL heel raise with 15# x 20 Standing SL heel raise x 10  PATIENT EDUCATION:  Education details:  Exam findings, POC, HEP Person educated: Patient Education method: Explanation, Demonstration, Tactile cues, Verbal cues, and  Handouts Education comprehension: verbalized understanding, returned demonstration, verbal cues required, tactile cues required, and needs further education  HOME EXERCISE PROGRAM: Access Code: [email or web address]    ASSESSMENT: CLINICAL IMPRESSION: Patient is a 74 y.o. 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 She exhibits limitations in her left ankle motion and strength, as well as left hip strength deficit and previous right knee injury that could be contributing to her current left ankle symptoms.    OBJECTIVE IMPAIRMENTS: decreased activity tolerance, decreased ROM, decreased strength, and pain.   ACTIVITY LIMITATIONS: sitting, standing, and locomotion level  PARTICIPATION LIMITATIONS: community activity  PERSONAL FACTORS: Past/current experiences and Time since onset of injury/illness/exacerbation are also affecting patient's functional outcome.   REHAB POTENTIAL: Good  CLINICAL DECISION MAKING: Stable/uncomplicated  EVALUATION COMPLEXITY: Low   GOALS: Goals reviewed with patient? Yes  SHORT TERM GOALS: Target date: 10/31/2024  Patient will be I with initial HEP in order to progress with therapy. Baseline: HEP provided at eval Goal status: INITIAL  2.  Patient will report left lateral ankle pain </= 4/10 when pain occurs to reduce functional limitations Baseline: 7/10 Goal status: INITIAL  LONG TERM GOALS: Target date: 11/28/2024  Patient will be I with final HEP to maintain progress from PT. Baseline: HEP provided at eval Goal status: INITIAL  2.  Patient will report LEFS >/= 77/80 in order to indicate improvement in functional status. Baseline: 72/80 Goal status: INITIAL  3.  Patient will exhibit left ankle strength 5/5 MMT in order to reduce left ankle pain and improve functional ability Baseline: see limitations above Goal status:  INITIAL  4.  Patient will demonstrate left ankle eversion AROM >/= 10 deg in order to improve gait and activity tolerance Baseline: 2 deg Goal status: INITIAL   PLAN: PT FREQUENCY: 1-2x/week  PT DURATION: 8 weeks  PLANNED INTERVENTIONS: 97164- PT Re-evaluation, 97750- Physical Performance Testing, 97110-Therapeutic exercises, 97530- Therapeutic activity, 97112- Neuromuscular re-education, 97535- Self Care, [id number]- Manual therapy, [phone]- Gait training, [phone] (1-2 muscles), [phone] (3+ muscles)- Dry Needling, Patient/Family education, Balance training, Taping, Joint mobilization, Joint manipulation, Spinal manipulation, Spinal mobilization, Cryotherapy, and Moist heat  PLAN FOR NEXT SESSION: Review HEP and progress PRN, manual for left peroneal and ankle mobility, progress left ankle and hip strengthening, incorporate core stabilization and strengthening, dynamic balance training exercises   Elaine Daring, PT, DPT, LAT, ATC 10/03/2024  3:39 PM Phone: [phone] Fax: [phone]  "

## 2024-10-03 NOTE — Patient Instructions (Signed)
 Access Code: [email or web address] URL: [email or web address] Date: 10/03/2024 Prepared by: Elaine Daring  Exercises - Clam with Resistance  - 3 x weekly - 3 sets - 15 reps - Long Sitting Ankle Eversion with Resistance  - 3 x weekly - 3 sets - 15 reps - Seated Heel Raise  - 3 x weekly - 3 sets - 20 reps - Single Leg Heel Raise with Chair Support  - 3 x weekly - 3 sets - 10 reps

## 2024-10-06 ENCOUNTER — Ambulatory Visit: Payer: Self-pay | Admitting: Family Medicine

## 2024-10-06 NOTE — Progress Notes (Signed)
 Lumbar spine x-ray shows just a little bit of arthritis.

## 2024-10-06 NOTE — Progress Notes (Signed)
 Left ankle x-ray does show some old injuries to the ankle but no new injuries or severe arthritis.

## 2024-10-11 ENCOUNTER — Ambulatory Visit: Admitting: Physical Therapy

## 2024-10-11 ENCOUNTER — Encounter: Payer: Self-pay | Admitting: Physical Therapy

## 2024-10-11 ENCOUNTER — Other Ambulatory Visit: Payer: Self-pay

## 2024-10-11 DIAGNOSIS — M6281 Muscle weakness (generalized): Secondary | ICD-10-CM

## 2024-10-11 DIAGNOSIS — M25572 Pain in left ankle and joints of left foot: Secondary | ICD-10-CM | POA: Diagnosis not present

## 2024-10-11 NOTE — Therapy (Signed)
 " OUTPATIENT PHYSICAL THERAPY TREATMENT   Patient Name: Brittany Myers MRN: [id number] DOB:Sep 09, 1951, 74 y.o., female Today's Date: 10/11/2024   END OF SESSION:  PT End of Session - 10/11/24 1420     Visit Number 2    Number of Visits 9    Date for Recertification  11/28/24    Authorization Type [employer] MCR    Progress Note Due on Visit 10    PT Start Time 1346    PT Stop Time 1430    PT Time Calculation (min) 44 min    Activity Tolerance Patient tolerated treatment well    Behavior During Therapy [facility] for tasks assessed/performed           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  Minimal aortic root calcification   HEMORRHOID SURGERY     LAPAROSCOPIC ENDOMETRIOSIS FULGURATION     5 surgeries; Dr Paola, Gyn   POLYPECTOMY  09/30/1999   Dr.Sam [place]    TREADMILL ECHO STRESS TEST  05/26/2016   Electrically and echocardiographically negative stress echo.  No ischemia or infarction.  LOW RISK.  Baseline EF 55 to 60% with normal increase to 75-80% hyperdynamic function with exercise.  Normal recovery.  No wall motion abnormalities.   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   Facet arthropathy, multilevel 03/02/2012   Family history of ischemic heart disease 05/20/2011   Osteoporosis 05/20/2011   Sleep disorder 05/15/2010   TINNITUS, RIGHT 12/20/2009   COLONIC POLYPS, BENIGN 02/11/2008    PCP: Geofm Glade PARAS, MD   REFERRING PROVIDER: Joane Artist RAMAN, MD  REFERRING DIAG: Chronic pain of left ankle  THERAPY DIAG:  Pain in left ankle and joints of left foot  Muscle weakness (generalized)  Rationale for Evaluation and Treatment: Rehabilitation  ONSET DATE: Chronic   SUBJECTIVE:  SUBJECTIVE STATEMENT: Patient reports she hasn't had the pain since last visit. She has been consistent with her exercises.  Eval: Patient reports she broke her left foot a couple years ago and it took a while to heal, and she also reports chronic plantar fascitis bilaterally. Then all of sudden the outside of the left ankle started hurting. The pain comes and goes. If she has been sitting or inactive for extended periods of time, then when she gets up to stand or walk then she will have pain with walking and it will remain tender for a day or so. If she pumps the ankle while sitting or when it  happens then that will help with the pain. She also reports soreness toward the end of the with a lot of walking. The pain on the outside of the left can go up the outside of the leg and feels like a charlie horse. She does report a right knee meniscus injury. Currently she does weight training and cardio in the gym, she uses machines, she is not running but has been using the elliptical.   PERTINENT HISTORY: See PMH above  PAIN:  Are you having pain? Yes:  NPRS scale: 0/10 currently, 7/10 at worst Pain location: Left lateral ankle, lateral lower leg Pain description: charlie horse Aggravating factors: Standing/walking after sitting for extended period of  time, extended periods of walking  Relieving factors: Rest, gentle movement  PRECAUTIONS: None  PATIENT GOALS: Pain relief    OBJECTIVE:  Note: Objective measures were completed at Evaluation unless otherwise noted. PATIENT SURVEYS:  LEFS: 72/80  EDEMA:  No observable swelling noted  MUSCLE LENGTH: Grossly WFL  POSTURE:   Grossly WFL  PALPATION: Tender to palpation left peroneal muscle belly  LOWER EXTREMITY ROM:  Active ROM Right eval Left eval  Hip flexion    Hip extension    Hip abduction    Hip adduction    Hip internal rotation    Hip external rotation    Knee flexion    Knee extension    Ankle dorsiflexion 13 12  Ankle plantarflexion 55 51  Ankle inversion 43 40  Ankle eversion 10 2   (Blank rows = not tested)  LOWER EXTREMITY MMT:  MMT Right eval Left eval  Hip flexion    Hip extension 4 4-  Hip abduction 4 4-  Hip adduction    Hip internal rotation    Hip external rotation    Knee flexion    Knee extension    Ankle dorsiflexion 5 5  Ankle plantarflexion 5 4  Ankle inversion 5 4  Ankle eversion 5 4   (Blank rows = not tested)  FUNCTIONAL TESTS:  Not formally assessed  GAIT: Assistive device utilized: None Level of assistance: Complete Independence Comments: grossly WFL                                                                                                                               TREATMENT [facility] Adult PT Treatment:                                                DATE: 10/11/2024 STM for left peroneals Left talocrural distraction, A/P mobs, subtalar mobs med/lat, distal and proximal tib-fib A/P mobs Sidelying ankle eversion with 2# 3 x 20 left Seated figure-4 ankle inversion with green  Standing SL heel raise 2 x 10 Lateral band walk with green at knees 3 x 20 down/back  PATIENT  EDUCATION:  Education details: HEP update Person educated: Patient Education method: Explanation, Demonstration, Tactile cues, Verbal cues,  and Handouts Education comprehension: verbalized understanding, returned demonstration, verbal cues required, tactile cues required, and needs further education  HOME EXERCISE PROGRAM: Access Code: [email or web address]    ASSESSMENT: CLINICAL IMPRESSION: Patient tolerated therapy well with no adverse effects. Therapy focused on progressing her left ankle mobility and strengthening with good tolerance. Performed manual for left peroneals and ankle with good therapeutic benefit. She was able to progress with isolated ankle strengthening and with upright hip strengthening. Updated HEP to progress hip strengthening for home. Patient would benefit from continued skilled PT to progress mobility and strength in order to reduce pain and maximize functional ability.   Eval: Patient is a 74 y.o. 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 She exhibits limitations in her left ankle motion and strength, as well as left hip strength deficit and previous right knee injury that could be contributing to her current left ankle symptoms.    OBJECTIVE IMPAIRMENTS: decreased activity tolerance, decreased ROM, decreased strength, and pain.   ACTIVITY LIMITATIONS: sitting, standing, and locomotion level  PARTICIPATION LIMITATIONS: community activity  PERSONAL FACTORS: Past/current experiences and Time since onset of injury/illness/exacerbation are also affecting patient's functional outcome.    GOALS: Goals reviewed with patient? Yes  SHORT TERM GOALS: Target date: 10/31/2024  Patient will be I with initial HEP in order to progress with therapy. Baseline: HEP provided at eval Goal status: INITIAL  2.  Patient will report left lateral ankle pain </= 4/10 when pain occurs to reduce functional limitations Baseline: 7/10 Goal status: INITIAL  LONG TERM GOALS: Target date:  11/28/2024  Patient will be I with final HEP to maintain progress from PT. Baseline: HEP provided at eval Goal status: INITIAL  2.  Patient will report LEFS >/= 77/80 in order to indicate improvement in functional status. Baseline: 72/80 Goal status: INITIAL  3.  Patient will exhibit left ankle strength 5/5 MMT in order to reduce left ankle pain and improve functional ability Baseline: see limitations above Goal status: INITIAL  4.  Patient will demonstrate left ankle eversion AROM >/= 10 deg in order to improve gait and activity tolerance Baseline: 2 deg Goal status: INITIAL   PLAN: PT FREQUENCY: 1-2x/week  PT DURATION: 8 weeks  PLANNED INTERVENTIONS: 97164- PT Re-evaluation, 97750- Physical Performance Testing, 97110-Therapeutic exercises, 97530- Therapeutic activity, 97112- Neuromuscular re-education, 97535- Self Care, 02859- Manual therapy, [phone]- Gait training, [phone] (1-2 muscles), 20561 (3+ muscles)- Dry Needling, Patient/Family education, Balance training, Taping, Joint mobilization, Joint manipulation, Spinal manipulation, Spinal mobilization, Cryotherapy, and Moist heat  PLAN FOR NEXT SESSION: Review HEP and progress PRN, manual for left peroneal and ankle mobility, progress left ankle and hip strengthening, incorporate core stabilization and strengthening, dynamic balance training exercises   Elaine Daring, PT, DPT, LAT, ATC 10/11/2024  2:59 PM Phone: [phone] Fax: [phone]  "

## 2024-10-11 NOTE — Patient Instructions (Signed)
 Access Code: [email or web address] URL: [email or web address] Date: 10/11/2024 Prepared by: Elaine Daring  Exercises - Clam with Resistance  - 3 x weekly - 3 sets - 15 reps - Long Sitting Ankle Eversion with Resistance  - 3 x weekly - 3 sets - 15 reps - Seated Heel Raise  - 3 x weekly - 3 sets - 20 reps - Single Leg Heel Raise with Chair Support  - 3 x weekly - 3 sets - 10 reps - Side Stepping with Resistance at Thighs  - 3 x weekly - 3 sets - 20 reps

## 2024-10-12 ENCOUNTER — Encounter: Payer: Self-pay | Admitting: Cardiology

## 2024-10-12 ENCOUNTER — Ambulatory Visit: Attending: Cardiology | Admitting: Cardiology

## 2024-10-12 VITALS — BP 114/68 | HR 68 | Ht 64.0 in | Wt 132.8 lb

## 2024-10-12 DIAGNOSIS — I479 Paroxysmal tachycardia, unspecified: Secondary | ICD-10-CM

## 2024-10-12 DIAGNOSIS — I493 Ventricular premature depolarization: Secondary | ICD-10-CM | POA: Insufficient documentation

## 2024-10-12 DIAGNOSIS — R000 Tachycardia, unspecified: Secondary | ICD-10-CM | POA: Diagnosis not present

## 2024-10-12 NOTE — Patient Instructions (Signed)
 Medication Instructions:   No changes  *If you need a refill on your cardiac medications before your next appointment, please call your pharmacy*   Lab Work: Not needed    Testing/Procedures: Your physician has recommended that you wear an event monitor 7 day  Zio. Event monitors are medical devices that record the hearts electrical activity. Doctors most often us  these monitors to diagnose arrhythmias. Arrhythmias are problems with the speed or rhythm of the heartbeat. The monitor is a small, portable device. You can wear one while you do your normal daily activities. This is usually used to diagnose what is causing palpitations/syncope (passing out).    Follow-Up: At [facility], you and your health needs are our priority.  As part of our continuing mission to provide you with exceptional heart care, we have created designated Provider Care Teams.  These Care Teams include your primary Cardiologist (physician) and Advanced Practice Providers (APPs -  Physician Assistants and Nurse Practitioners) who all work together to provide you with the care you need, when you need it.     Your next appointment:   1 month(s)  The format for your next appointment:   Virtual Visit   Provider:   Alm Clay, MD   Other Instructions   ZIO XT- Long Term Monitor Instructions  Your physician has requested you wear a ZIO patch monitor for 7 days.  This is a single patch monitor. [employer] supplies one patch monitor per enrollment. Additional stickers are not available. Please do not apply patch if you will be having a Nuclear Stress Test,  Echocardiogram, Cardiac CT, MRI, or Chest Xray during the period you would be wearing the  monitor. The patch cannot be worn during these tests. You cannot remove and re-apply the  ZIO XT patch monitor.  Your ZIO patch monitor will be mailed 3 day USPS to your address on file. It may take 3-5 days  to receive your monitor after you have been enrolled.   Once you have received your monitor, please review the enclosed instructions. Your monitor  has already been registered assigning a specific monitor serial # to you.  Billing and Patient Assistance Program Information  We have supplied [employer] with any of your insurance information on file for billing purposes. [employer] offers a sliding scale Patient Assistance Program for patients that do not have  insurance, or whose insurance does not completely cover the cost of the ZIO monitor.  You must apply for the Patient Assistance Program to qualify for this discounted rate.  To apply, please call [employer] at [phone], select option 4, select option 2, ask to apply for  Patient Assistance Program. Meredeth will ask your household income, and how many people  are in your household. They will quote your out-of-pocket cost based on that information.  [employer] will also be able to set up a 12-month, interest-free payment plan if needed.  Applying the monitor   Shave hair from upper left chest.  Hold abrader disc by orange tab. Rub abrader in 40 strokes over the upper left chest as  indicated in your monitor instructions.  Clean area with 4 enclosed alcohol pads. Let dry.  Apply patch as indicated in monitor instructions. Patch will be placed under collarbone on left  side of chest with arrow pointing upward.  Rub patch adhesive wings for 2 minutes. Remove white label marked 1. Remove the white  label marked 2. Rub patch adhesive wings for 2 additional minutes.  While looking in a  mirror, press and release button in center of patch. A small green light will  flash 3-4 times. This will be your only indicator that the monitor has been turned on.  Do not shower for the first 24 hours. You may shower after the first 24 hours.  Press the button if you feel a symptom. You will hear a small click. Record Date, Time and  Symptom in the Patient Logbook.  When you are ready to remove the patch, follow  instructions on the last 2 pages of Patient  Logbook. Stick patch monitor onto the last page of Patient Logbook.  Place Patient Logbook in the blue and white box. Use locking tab on box and tape box closed  securely. The blue and white box has prepaid postage on it. Please place it in the mailbox as  soon as possible. Your physician should have your test results approximately 7 days after the  monitor has been mailed back to [facility].  Call [facility] Customer Care at [phone] if you have questions regarding  your ZIO XT patch monitor. Call them immediately if you see an orange light blinking on your  monitor.  If your monitor falls off in less than 4 days, contact our Monitor department at [phone].  If your monitor becomes loose or falls off after 4 days call [employer] at [phone] for  suggestions on securing your monitor

## 2024-10-12 NOTE — Progress Notes (Signed)
 " Cardiology Office Note:  .   Date:  10/13/2024  ID:  Brittany Myers, DOB [birth year], MRN [id number] PCP: Geofm Glade PARAS, MD  [facility] Providers Cardiologist:  Alm Clay, MD     Chief Complaint  Patient presents with   Follow-up   Palpitations    Patient Profile: .     Brittany Myers is a 74 y.o. female  with a PMH notable for anxiety and intermittent tachycardia who presents here for evaluation of tachycardia at the request of Burns, Stacy J, MD.  I last saw Brittany Myers in September 2023 for tachycardia episodes-these episodes seem to be related to significant social stress related to caring for her mother, her mother's death and and a split with her brother over issues with her mother's health.  Monitor was relatively unrevealing.  She previously evaluated with a stress echo in 2017 that was nonischemic.  Nonspecific findings on EKG.  I ordered a monitor just to assess her tachycardia symptoms. She was seen in follow-up by Delon Holts, PA on 07/09/2022 to review the results of her Zio patch monitor.  Recommended stress management, daily exercise and hydration.  Avoiding caffeine and alcohol.  Reassuring Coronary Calcium  Score, and prior stress echo.     Brittany Myers was last seen by her PCP-Dr. Geofm on 07/13/2024-mostly noting sleep difficulties and anxiety-anxiety worse at night.  Subjective  Discussed the use of AI scribe software for clinical note transcription with the patient, who gave verbal consent to proceed.  History of Present Illness Brittany Myers is a 74 year old female who presents with unexplained rapid heart rate.  She experiences episodes of rapid heart rate, described as uncomfortable and often accompanied by a sensation that she needs to cough. These episodes occur even when sitting still, and coughing seems to help settle her heart rate. Her heart rate can reach around 100 beats per minute during these episodes. The episodes have been  occurring daily since mid-December, initially multiple times per day, now reduced to about once a day. They last less than five minutes and resolve quickly, allowing her to continue with her activities. She has experienced lightheadedness during these episodes, which she did not experience previously. No chest pain, dizziness, or syncope during these episodes.  She has a history of being prescribed Celexa , an SSRI, but did not take it as her symptoms resolved previously. She is currently taking Fosamax  for her bones, an estrogen cream for osteopenia, and supplements including a multivitamin, magnesium, and calcium . She stopped taking all supplements for a week to see if it affected her symptoms, but it did not make a difference.  She has a family history of heart conditions, with her mother having died from an aortic tear and her father from congestive heart failure. She is concerned about heart-related issues due to this family history. She has been using an Apple Watch to monitor her heart rate and has sent EKG readings to the clinic. The readings showed inconclusive results when her heart rate exceeded 100. Her husband has had similar issues and underwent an ablation.  Her heart rate can reach up to 146 beats per minute during stressful situations, such as dealing with her stepdaughter, but typically it is around 109 when sitting and can go up to 130 during physical activity in the gym. She mentions feeling lightheaded and having a 'funny' head sensation since spring, along with symptoms of dry nasal passages and a current episode of conjunctivitis. She attributes  some of her symptoms to stress and emotional factors, particularly related to family dynamics.    Objective   She has a history of hyperglycemia and a family history of diabetes but has not been diagnosed with diabetes herself. She has osteoporosis taking alendronate .  Studies Reviewed: Brittany Myers   EKG Interpretation Date/Time:  Wednesday  October 12 2024 16:19:37 EST Ventricular Rate:  68 PR Interval:  152 QRS Duration:  82 QT Interval:  364 QTC Calculation: 387 R Axis:   75  Text Interpretation: Normal sinus rhythm Nonspecific ST and T wave abnormality No previous ECGs available Confirmed by Anner Lenis ([id number]) on 10/12/2024 5:19:52 PM    Lab Results  Component Value Date   CHOL 178 07/13/2024   HDL 62.50 07/13/2024   LDLCALC 99 07/13/2024   LDLDIRECT 116.2 05/20/2012   TRIG 81.0 07/13/2024   CHOLHDL 3 07/13/2024   Lab Results  Component Value Date   NA 139 07/13/2024   K 3.9 07/13/2024   CREATININE 0.80 07/13/2024   GFR 73.17 07/13/2024   GLUCOSE 104 (H) 07/13/2024    TREADMILL STRESS ECHO 05/26/2016: Electrically and echocardiographically negative stress echo.  No ischemia or infarction.  LOW RISK.  Baseline EF 55 to 60% with normal increase to 75-80% hyperdynamic function with exercise.  Normal recovery.  No wall motion abnormalities. Coronary Calcium  Score (01/31/2021): Total score 0.  Minimal aortic root calcification noted.   Zio patch (September 2023): NSR sinus bradycardia and sinus tachycardia.  Rare PACs and PVCs.  No brief/nonsustained or sustained SVT, VT, or A-fib.  Heart rate range 46 to 136 bpm with an average of 75 bpm.  Risk Assessment/Calculations:           Physical Exam:   VS:  BP 114/68 (BP Location: Left Arm, Patient Position: Sitting, Cuff Size: Normal)   Pulse 68   Ht 5' 4 (1.626 m)   Wt 132 lb 12.8 oz (60.2 kg)   SpO2 96%   BMI 22.80 kg/m    Wt Readings from Last 3 Encounters:  10/12/24 132 lb 12.8 oz (60.2 kg)  09/20/24 132 lb (59.9 kg)  07/13/24 132 lb (59.9 kg)    GEN: Well nourished, well groomed; in no acute distress; healthy appearing NECK: No JVD; No carotid bruits CARDIAC: Normal S1, S2; RRR, no murmurs, rubs, gallops RESPIRATORY:  Clear to auscultation without rales, wheezing or rhonchi ; nonlabored, good air movement. ABDOMEN: Soft, non-tender,  non-distended EXTREMITIES:  No edema; No deformity      ASSESSMENT AND PLAN: .   Paroxysmal tachycardia (HCC) Intermittent tachycardia with PVCs, likely stress-induced. No significant arrhythmias on prior monitoring. EKGs inconclusive for AFib. - Ordered one-week cardiac monitor to assess PVC burden and rule out significant arrhythmias. - Scheduled follow-up virtual visit in one month to review monitor results. - Consider beta blocker therapy if PVC burden is high and symptomatic  Premature ventricular contractions (PVCs) (VPCs) - Ordered one-week cardiac monitor to assess PVC burden and rule out significant arrhythmias. - Scheduled follow-up virtual visit in one month to review monitor results. - Consider beta blocker therapy if PVC burden is high and symptomatic   Orders Placed This Encounter  Procedures   LONG TERM MONITOR (3-14 DAYS)   EKG 12-Lead           Follow-Up: Return in about 1 month (around 11/12/2024).       Signed, Lenis MICAEL Anner, MD, MS Lenis Anner, M.D., M.S. Interventional Cardiologist  [facility] Pager # [phone]      "

## 2024-10-13 ENCOUNTER — Ambulatory Visit: Attending: Cardiology

## 2024-10-13 ENCOUNTER — Encounter: Payer: Self-pay | Admitting: Cardiology

## 2024-10-13 ENCOUNTER — Telehealth: Payer: Self-pay | Admitting: Physical Therapy

## 2024-10-13 DIAGNOSIS — I493 Ventricular premature depolarization: Secondary | ICD-10-CM

## 2024-10-13 DIAGNOSIS — R000 Tachycardia, unspecified: Secondary | ICD-10-CM

## 2024-10-13 NOTE — Telephone Encounter (Signed)
 Patient received a letter from her insurance stating that she was only approved for 6 visit. FYI.

## 2024-10-13 NOTE — Progress Notes (Unsigned)
 Enrolled for [employer] to mail a ZIO XT long term holter monitor to the patients address on file.   Dr. Herbie Baltimore to read.

## 2024-10-13 NOTE — Assessment & Plan Note (Signed)
-   Ordered one-week cardiac monitor to assess PVC burden and rule out significant arrhythmias. - Scheduled follow-up virtual visit in one month to review monitor results. - Consider beta blocker therapy if PVC burden is high and symptomatic

## 2024-10-13 NOTE — Assessment & Plan Note (Signed)
 Intermittent tachycardia with PVCs, likely stress-induced. No significant arrhythmias on prior monitoring. EKGs inconclusive for AFib. - Ordered one-week cardiac monitor to assess PVC burden and rule out significant arrhythmias. - Scheduled follow-up virtual visit in one month to review monitor results. - Consider beta blocker therapy if PVC burden is high and symptomatic

## 2024-10-17 ENCOUNTER — Ambulatory Visit: Admitting: Physical Therapy

## 2024-10-17 ENCOUNTER — Encounter: Payer: Self-pay | Admitting: Physical Therapy

## 2024-10-17 ENCOUNTER — Other Ambulatory Visit: Payer: Self-pay

## 2024-10-17 DIAGNOSIS — M6281 Muscle weakness (generalized): Secondary | ICD-10-CM | POA: Diagnosis not present

## 2024-10-17 DIAGNOSIS — M25572 Pain in left ankle and joints of left foot: Secondary | ICD-10-CM | POA: Diagnosis not present

## 2024-10-17 NOTE — Therapy (Signed)
 " OUTPATIENT PHYSICAL THERAPY TREATMENT   Patient Name: Brittany Myers MRN: [id number] DOB:Jul 17, 1951, 74 y.o., female Today's Date: 10/17/2024   END OF SESSION:  PT End of Session - 10/17/24 1351     Visit Number 3    Number of Visits 9    Date for Recertification  11/28/24    Authorization Type [employer] MCR    Authorization Time Period 10/03/2024 - 11/28/2024    Authorization - Visit Number 3    Authorization - Number of Visits 6    Progress Note Due on Visit 10    PT Start Time 1351    PT Stop Time 1430    PT Time Calculation (min) 39 min    Activity Tolerance Patient tolerated treatment well    Behavior During Therapy [facility] for tasks assessed/performed            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  Minimal aortic root calcification   HEMORRHOID SURGERY     LAPAROSCOPIC ENDOMETRIOSIS FULGURATION     5 surgeries; Dr Paola, Gyn   POLYPECTOMY  09/30/1999   Dr.Sam Whittingham    TREADMILL ECHO STRESS TEST  05/26/2016   Electrically and echocardiographically negative stress echo.  No ischemia or infarction.  LOW RISK.  Baseline EF 55 to 60% with normal increase to 75-80% hyperdynamic function with exercise.  Normal recovery.  No wall motion abnormalities.   Patient Active Problem List   Diagnosis Date Noted   Premature ventricular contractions (PVCs) (VPCs) 10/12/2024   Postnasal drip 07/13/2024   Paroxysmal tachycardia (HCC) 05/26/2022   Knee effusion, right 12/02/2021   Degenerative tear of medial meniscus of right knee 08/13/2021   Metatarsalgia of both feet 08/19/2019   Nondisplaced fracture of fifth metatarsal bone, left  foot, subsequent encounter for fracture with routine healing 08/09/2019   Cough 10/13/2018   Fatigue 05/21/2018   Hyperglycemia 07/28/2016   Anxiety 07/28/2016   Nonspecific abnormal electrocardiogram (ECG) (EKG) 03/11/2016   Shingles rash 02/13/2016   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   Cochlear hydrops of right ear 07/24/2015   Facet arthropathy, multilevel 03/02/2012   Family history of ischemic heart disease 05/20/2011   Osteoporosis 05/20/2011   Sleep disorder 05/15/2010   TINNITUS, RIGHT 12/20/2009   COLONIC POLYPS, BENIGN 02/11/2008    PCP: Geofm Glade PARAS, MD   REFERRING PROVIDER: Joane Artist RAMAN, MD  REFERRING DIAG: Chronic pain of left ankle  THERAPY DIAG:  Pain in left ankle and joints of left foot  Muscle weakness (generalized)  Rationale for Evaluation and Treatment: Rehabilitation  ONSET DATE: Chronic   SUBJECTIVE:  SUBJECTIVE STATEMENT: Patient reports she still hasn't had any issues with her ankle since we started.   Eval: Patient reports she broke her left foot a couple years ago and it took a while to heal, and she also reports chronic plantar fascitis bilaterally. Then all of sudden the outside of the left ankle started hurting. The pain comes and goes. If she has been sitting or inactive for extended periods of time, then  when she gets up to stand or walk then she will have pain with walking and it will remain tender for a day or so. If she pumps the ankle while sitting or when it happens then that will help with the pain. She also reports soreness toward the end of the with a lot of walking. The pain on the outside of the left can go up the outside of the leg and feels like a charlie horse. She does report a right knee meniscus injury. Currently she does weight training and cardio in the gym, she uses machines, she is not running but has been using the elliptical.   PERTINENT HISTORY: See PMH above  PAIN:  Are you having pain? Yes:  NPRS scale: 0/10  currently, 7/10 at worst Pain location: Left lateral ankle, lateral lower leg Pain description: charlie horse Aggravating factors: Standing/walking after sitting for extended period of time, extended periods of walking  Relieving factors: Rest, gentle movement  PRECAUTIONS: None  PATIENT GOALS: Pain relief    OBJECTIVE:  Note: Objective measures were completed at Evaluation unless otherwise noted. PATIENT SURVEYS:  LEFS: 72/80  EDEMA:  No observable swelling noted  MUSCLE LENGTH: Grossly WFL  POSTURE:   Grossly WFL  PALPATION: Tender to palpation left peroneal muscle belly  LOWER EXTREMITY ROM:  Active ROM Right eval Left eval  Hip flexion    Hip extension    Hip abduction    Hip adduction    Hip internal rotation    Hip external rotation    Knee flexion    Knee extension    Ankle dorsiflexion 13 12  Ankle plantarflexion 55 51  Ankle inversion 43 40  Ankle eversion 10 2   (Blank rows = not tested)  LOWER EXTREMITY MMT:  MMT Right eval Left eval  Hip flexion    Hip extension 4 4-  Hip abduction 4 4-  Hip adduction    Hip internal rotation    Hip external rotation    Knee flexion    Knee extension    Ankle dorsiflexion 5 5  Ankle plantarflexion 5 4  Ankle inversion 5 4  Ankle eversion 5 4   (Blank rows = not tested)  FUNCTIONAL TESTS:  Not formally assessed  GAIT: Assistive device utilized: None Level of assistance: Complete Independence Comments: grossly [facility]                                                                                                                               TREATMENT [facility] Adult PT Treatment:                                                DATE: 10/17/2024 Bridge with green at knees 10 x 5 sec Side clamshell with green 2 x 15 each Lateral band  walk with green at knees 2 x 20 down/back Standing SL heel raise x 10, edge of 4 box 2 x 10 each SL balance on Airex 3 x 30 sec each  PATIENT EDUCATION:  Education  details: HEP Person educated: Patient Education method: Explanation, Demonstration, Tactile cues, Verbal cues Education comprehension: verbalized understanding, returned demonstration, verbal cues required, tactile cues required, and needs further education  HOME EXERCISE PROGRAM: Access Code: [email or web address]    ASSESSMENT: CLINICAL IMPRESSION: Patient tolerated therapy well with no adverse effects. Therapy focused on progressing her hip and ankle strengthening with good tolerance. She does continue to report greater weakness of the left ankle, hip and core but was able to progress with some strengthening this visit and denies any recent left ankle pain with sitting extended periods. No changes made to her HEP but discussed incorporating more balance at home. Patient would benefit from continued skilled PT to progress mobility and strength in order to reduce pain and maximize functional ability.   Eval: Patient is a 74 y.o. 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 She exhibits limitations in her left ankle motion and strength, as well as left hip strength deficit and previous right knee injury that could be contributing to her current left ankle symptoms.    OBJECTIVE IMPAIRMENTS: decreased activity tolerance, decreased ROM, decreased strength, and pain.   ACTIVITY LIMITATIONS: sitting, standing, and locomotion level  PARTICIPATION LIMITATIONS: community activity  PERSONAL FACTORS: Past/current experiences and Time since onset of injury/illness/exacerbation are also affecting patient's functional outcome.    GOALS: Goals reviewed with patient? Yes  SHORT TERM GOALS: Target date: 10/31/2024  Patient will be I with initial HEP in order to progress with therapy. Baseline: HEP provided at eval Goal status: INITIAL  2.  Patient will report left lateral ankle  pain </= 4/10 when pain occurs to reduce functional limitations Baseline: 7/10 Goal status: INITIAL  LONG TERM GOALS: Target date: 11/28/2024  Patient will be I with final HEP to maintain progress from PT. Baseline: HEP provided at eval Goal status: INITIAL  2.  Patient will report LEFS >/= 77/80 in order to indicate improvement in functional status. Baseline: 72/80 Goal status: INITIAL  3.  Patient will exhibit left ankle strength 5/5 MMT in order to reduce left ankle pain and improve functional ability Baseline: see limitations above Goal status: INITIAL  4.  Patient will demonstrate left ankle eversion AROM >/= 10 deg in order to improve gait and activity tolerance Baseline: 2 deg Goal status: INITIAL   PLAN: PT FREQUENCY: 1-2x/week  PT DURATION: 8 weeks  PLANNED INTERVENTIONS: 97164- PT Re-evaluation, 97750- Physical Performance Testing, 97110-Therapeutic exercises, 97530- Therapeutic activity, 97112- Neuromuscular re-education, 97535- Self Care, [id number]- Manual therapy, [phone]- Gait training, [phone] (1-2 muscles), [id number] (3+ muscles)- Dry Needling, Patient/Family education, Balance training, Taping, Joint mobilization, Joint manipulation, Spinal manipulation, Spinal mobilization, Cryotherapy, and Moist heat  PLAN FOR NEXT SESSION: Review HEP and progress PRN, manual for left peroneal and ankle mobility, progress left ankle and hip strengthening, incorporate core stabilization and strengthening, dynamic balance training exercises   Elaine Daring, PT, DPT, LAT, ATC 10/17/24  2:42 PM Phone: [phone] Fax: [phone]  "

## 2024-10-24 ENCOUNTER — Encounter: Admitting: Physical Therapy

## 2024-10-30 ENCOUNTER — Ambulatory Visit: Payer: Self-pay | Admitting: Cardiology

## 2024-10-30 DIAGNOSIS — I493 Ventricular premature depolarization: Secondary | ICD-10-CM

## 2024-10-30 DIAGNOSIS — R000 Tachycardia, unspecified: Secondary | ICD-10-CM

## 2024-10-31 ENCOUNTER — Encounter: Admitting: Physical Therapy

## 2024-10-31 NOTE — Telephone Encounter (Signed)
 fyi

## 2024-11-29 ENCOUNTER — Ambulatory Visit: Payer: Medicare Other

## 2024-11-29 ENCOUNTER — Ambulatory Visit: Admitting: Cardiology
